# Patient Record
Sex: Female | Born: 1970
Health system: Southern US, Community
[De-identification: ages and names within clinical notes are randomized; demographics above are authoritative.]

## PROBLEM LIST (undated history)

## (undated) DIAGNOSIS — K219 Gastro-esophageal reflux disease without esophagitis: Secondary | ICD-10-CM

## (undated) DIAGNOSIS — I10 Essential (primary) hypertension: Secondary | ICD-10-CM

## (undated) DIAGNOSIS — E039 Hypothyroidism, unspecified: Secondary | ICD-10-CM

## (undated) DIAGNOSIS — K635 Polyp of colon: Secondary | ICD-10-CM

## (undated) DIAGNOSIS — G8929 Other chronic pain: Secondary | ICD-10-CM

## (undated) DIAGNOSIS — E785 Hyperlipidemia, unspecified: Secondary | ICD-10-CM

## (undated) DIAGNOSIS — F419 Anxiety disorder, unspecified: Secondary | ICD-10-CM

## (undated) DIAGNOSIS — R519 Headache, unspecified: Secondary | ICD-10-CM

## (undated) DIAGNOSIS — T7840XA Allergy, unspecified, initial encounter: Secondary | ICD-10-CM

## (undated) DIAGNOSIS — R002 Palpitations: Secondary | ICD-10-CM

## (undated) DIAGNOSIS — Z87442 Personal history of urinary calculi: Secondary | ICD-10-CM

## (undated) DIAGNOSIS — R51 Headache: Secondary | ICD-10-CM

## (undated) DIAGNOSIS — G4730 Sleep apnea, unspecified: Secondary | ICD-10-CM

## (undated) HISTORY — DX: Polyp of colon: K63.5

## (undated) HISTORY — DX: Gastro-esophageal reflux disease without esophagitis: K21.9

## (undated) HISTORY — DX: Other chronic pain: G89.29

## (undated) HISTORY — DX: Headache: R51

## (undated) HISTORY — PX: COLONOSCOPY: SHX174

## (undated) HISTORY — DX: Sleep apnea, unspecified: G47.30

## (undated) HISTORY — DX: Headache, unspecified: R51.9

## (undated) HISTORY — DX: Hyperlipidemia, unspecified: E78.5

## (undated) HISTORY — DX: Hypothyroidism, unspecified: E03.9

## (undated) HISTORY — PX: KNEE ARTHROSCOPY: SUR90

## (undated) HISTORY — DX: Personal history of urinary calculi: Z87.442

## (undated) HISTORY — DX: Palpitations: R00.2

## (undated) HISTORY — DX: Allergy, unspecified, initial encounter: T78.40XA

## (undated) HISTORY — DX: Anxiety disorder, unspecified: F41.9

---

## 2002-08-03 ENCOUNTER — Other Ambulatory Visit: Admission: RE | Admit: 2002-08-03 | Discharge: 2002-08-03 | Payer: Self-pay | Admitting: Family Medicine

## 2006-09-02 ENCOUNTER — Emergency Department (HOSPITAL_COMMUNITY): Admission: EM | Admit: 2006-09-02 | Discharge: 2006-09-02 | Payer: Self-pay | Admitting: Emergency Medicine

## 2006-09-24 ENCOUNTER — Encounter (INDEPENDENT_AMBULATORY_CARE_PROVIDER_SITE_OTHER): Payer: Self-pay | Admitting: Orthopedic Surgery

## 2006-09-24 ENCOUNTER — Ambulatory Visit: Payer: Self-pay | Admitting: *Deleted

## 2006-09-24 ENCOUNTER — Ambulatory Visit (HOSPITAL_COMMUNITY): Admission: RE | Admit: 2006-09-24 | Discharge: 2006-09-24 | Payer: Self-pay | Admitting: Orthopedic Surgery

## 2007-09-25 ENCOUNTER — Emergency Department (HOSPITAL_COMMUNITY): Admission: EM | Admit: 2007-09-25 | Discharge: 2007-09-25 | Payer: Self-pay | Admitting: Emergency Medicine

## 2007-10-01 ENCOUNTER — Ambulatory Visit: Payer: Self-pay | Admitting: Cardiology

## 2007-10-01 LAB — CONVERTED CEMR LAB
Free T4: 0.8 ng/dL (ref 0.6–1.6)
TSH: 3.08 microintl units/mL (ref 0.35–5.50)

## 2007-11-03 ENCOUNTER — Ambulatory Visit: Payer: Self-pay | Admitting: Cardiology

## 2008-07-22 ENCOUNTER — Ambulatory Visit: Payer: Self-pay | Admitting: Cardiology

## 2008-07-22 DIAGNOSIS — R002 Palpitations: Secondary | ICD-10-CM | POA: Insufficient documentation

## 2008-11-18 ENCOUNTER — Ambulatory Visit (HOSPITAL_COMMUNITY): Admission: RE | Admit: 2008-11-18 | Discharge: 2008-11-18 | Payer: Self-pay | Admitting: Obstetrics and Gynecology

## 2010-07-25 NOTE — Assessment & Plan Note (Signed)
[facility] HEALTHCARE                            CARDIOLOGY OFFICE NOTE   [name] [name], [name] [name]                          MRN:          [id number]  DATE:11/03/2007                            DOB:          [birth year]    REFERRING PHYSICIAN:  Ernestina Penna, M.D.   REFERRING PHYSICIAN:  Ernestina Penna, MD, [facility]  Medicine.   HISTORY OF PRESENT ILLNESS:  This is a 40 year old with a past history  of hyperthyroidism who presents for reevaluation after an episode of  supraventricular tachycardia.  This episode happened a little over a  month ago, and she felt chest tightness and palpitations and  lightheadedness; this was at work.  EMS was summoned and her heart rate  was 280 when EMS had arrived.  No strips were available.  She did a  Valsalva maneuver and her symptoms resolved before she arrived in the  emergency room.  We did see her in clinic on October 01, 2007, set her up  with 23-month of event monitor, a number of strips were transmitted, all  of which showed either sinus rhythm or sinus tachycardia.  She states  that she has had no further significant symptoms.  She actually says she  feels fine.  The stress in her life has gone down somewhat, and she has  actually begun regularly exercising.  She has cut out caffeine entirely.  We did check her thyroid function, and she does have normal TSH and free  T4 while on her levothyroxine.  She has not been using any illicit  drugs.  She does not use over-the-counter cold medicines.   SOCIAL HISTORY:  The patient did quit smoking in July 2009.  [name]  separated from her husband, lives with her child.   PAST MEDICAL HISTORY:  1. Significant for hypothyroidism.  2. Episode of SVT in July 2009, with no EKG strips available for      evaluation, though it sounds like it is probably AVNRT.   PHYSICAL EXAMINATION:  VITAL SIGNS:  Blood pressure today is 113/79,  pulse is 80 and regular, and weight is  167 pounds.  GENERAL:  The patient is in no apparent stress.  [name] a well-developed  female.  NECK:  No JVD.  No thyromegaly or nodule.  LUNGS:  Clear to auscultation bilaterally with normal respiratory  effort.  HEART:  Regular S1 and S2.  No S3.  No S4.  No murmur.  No peripheral  edema.  ABDOMEN:  Soft, nontender.  No hepatosplenomegaly.  Normal bowel sounds.  EXTREMITIES:  There is no clubbing or cyanosis.   ASSESSMENT AND PLAN:  This is a 40 year old with history of an episode  of supraventricular tachycardia, though unfortunately, there are no  strips available for review.  The patient did wear an event monitor for  a month.  All of the transmitted events showed sinus rhythm only.  She  has had no recurrence of the significant symptoms that she had  initially.  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  It did appear to terminate with the  Valsalva maneuver.  Since that time, the patient has cut all the  caffeine out of her diet, and she also says that she has cut back on all  of the stress in her life.  I think given the fact that these episodes  seemed to be few and far between given the lack of anything over the  last month.  It would be reasonable to continue to manage these episodes  expectantly.  I told to her at length about vagal maneuvers to terminate  an episode of atrioventricular nodal reentrant tachycardia, such as,  Valsalva, cough and if needed immersion of the face in ice water.  The  patient will continue to avoid caffeine.  If she does have recurrent  debilitating episodes and that we are able to document and if they are  indeed atrioventricular nodal reentrant tachycardia, then radiofrequency  ablation would be an option.  Of note, there was no evidence of a delta  wave on EKG to suggest Wolff-Parkinson-White.     Marca Ancona, MD  Electronically Signed    DM/MedQ  DD: 11/03/2007  DT: 11/03/2007  Job #:  [id number]   cc:   Ernestina Penna, M.D.  Madolyn Frieze Jens Som, MD, [facility]

## 2010-07-25 NOTE — Assessment & Plan Note (Signed)
[facility] HEALTHCARE                            CARDIOLOGY OFFICE NOTE   NAME:[name] [name]                          MRN:          [id number]  DATE:10/01/2007                            DOB:          [birth year]    PRIMARY CARE PHYSICIAN:  Ernestina Penna, MD   CHIEF COMPLAINT:  Palpitations.   HISTORY OF PRESENT ILLNESS:  This is a 40 year old female with past  history of hypothyroidism who presents for evaluation to cardiology  clinic after an episode of SVT.  The patient does report approximately 4  episodes over the last 3 years in which she has felt lightheaded with  palpitations and a racing heart.  These episodes are self-limited.  She  did experience another episode of the same sensations 1 week ago.  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  She  did not pass out.  She sat down and called a coworker who summoned EMS.  Reportedly, her heart rate was 208 when EMS arrived.  She was told to do  a Valsalva maneuver, which resolved her symptoms by the time she arrived  in the emergency department at [facility].  She was in normal sinus  rhythm again.  There was no strip available of the SVT.  As noted, her  prior episodes of palpitations were not serious per her.  She did have  an event monitor placed and has since then noted 3 mild episodes of  palpitations and minor lightheadedness.  She states that these were very  short episodes and each time, she did activate her event monitor.  She  did do a Valsalva maneuver with a couple of these episodes and feels  that that did help.  She has been under a lot of stress lately.  She  recently separated from her husband, has been very busy at work, and has  also been  helping care for both of her parents and for her 1 child.  She  does not use any illicit drugs and she has been taking her thyroid  medication as ordered.   LABORATORY DATA:  Of note, her most recent thyroid labs from June were  within normal limits.  EKG today shows normal sinus rhythm with  nonspecific anterolateral T-wave flattening and the QT is not prolonged.  There is a normal axis.  There is no delta wave.  Most recent labs from  June 2009, showed a TSH of 4.3, which is normal and a T4 of 7, which is  normal.  LDL was 79, HDL 48, and triglycerides 82.  LFTs were normal.  Potassium was 4.0 and hematocrit was 43.2.   SOCIAL HISTORY:  The patient lives with her child.  [name] separated  from her husband.  She does help provide some care to her parents.  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  The patient smoked for  about 6 months since she quit smoking on September 25, 2007.  She drinks 1-2  glasses of wine per week.  Has not overindulged in alcohol recently.  She denies any illicit drugs such as cocaine or amphetamines.   FAMILY HISTORY:  The patient' grandparents both had coronary artery  disease.  Her grandmother died in her 43s from a heart attack and her  grandfather had a heart attack at 62.   PAST MEDICAL HISTORY:  Significant for hypothyroidism.  The patient is  on Levoxyl.   MEDICATIONS:  Depo-Provera and Levoxyl 112 mcg daily.   REVIEW OF SYSTEMS:  The review of systems is negative except as noted in  the history of present illness.   PHYSICAL EXAMINATION:  VITAL SIGNS:  Blood pressure 90/65, heart rate  80, and weight 167 pounds.  GENERAL:  The patient is in no apparent distress.  NECK:  There is no carotid bruit.  There is no JVD.  LUNGS:  Clear to auscultation bilaterally.  HEART:  Regular, S1 and S2.  There is no S3, no S4, and no murmur.  ABDOMEN:  Soft and nontender.  No hepatosplenomegaly.  EXTREMITIES:  There is no edema.  There are 2+ dorsalis pedis  pulses  bilaterally.   ASSESSMENT AND PLAN:  This is a 40 year old with a history of episodes  of palpitations and lightheadedness that were associated with what  appeared to be a supraventricular tachycardia according to EMS report  last week.  1. Supraventricular tachycardia.  The patient did have an episode of      documented heart rate at 208.  She did not have an episode of      syncope associated with this.  She was able to terminate the      episode with a Valsalva maneuver.  Her EKG shows no delta wave.      There is no prolonged QT interval.  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  [name]      wearing an event monitor, which she will have on until October 29, 2007.  We did give over vagal maneuver such as Valsalva and if that      does not work, use of ice water immersion to terminate an episode      of SVT.  If this is indeed AVNRT, then AVNRT ablation is an option      for this patient, especially if she is having frequent and highly      symptomatic episodes.  For now, we will plan on awaiting the      results of her event monitor.  I will see her back in the office      the week after her event monitor is completed.  If she has any      further severe episodes, she is instructed to call here to contact      me, and I have also given her a prescription for Toprol-XL 25 mg      daily, which she may also start if she does begin to have frequent      symptoms.  2. Hypothyroidism.  The patient is on Levoxyl for hypothyroidism.  Her      thyroid indices were within normal limits in the beginning of June.      We will go ahead  and repeat her TSH and free T4, although she does      deny any changes in her medication regimen during that period.     Marca Ancona, MD  Electronically Signed    DM/MedQ  DD: 10/01/2007  DT: 10/02/2007  Job #: [id number]   cc:   Ernestina Penna, M.D.  Doylene Canning. Ladona Ridgel, MD

## 2010-12-08 LAB — DIFFERENTIAL
Basophils Absolute: 0
Eosinophils Relative: 1
Lymphocytes Relative: 25
Lymphs Abs: 2.7
Monocytes Absolute: 0.8
Neutro Abs: 7

## 2010-12-08 LAB — APTT: aPTT: 26

## 2010-12-08 LAB — POCT CARDIAC MARKERS
Myoglobin, poc: 44.2
Myoglobin, poc: 47.1
Myoglobin, poc: 48.2
Operator id: 272551
Operator id: 272551
Operator id: 272551
Troponin i, poc: <0.05
Troponin i, poc: <0.05

## 2010-12-08 LAB — POCT I-STAT, CHEM 8
BUN: 13
Calcium, Ion: 1.13
Creatinine, Ser: 0.9
Hemoglobin: 14.6
Sodium: 143
TCO2: 22

## 2010-12-08 LAB — CBC
HCT: 41.9
Hemoglobin: 14.6
RBC: 4.63
RDW: 12.7
WBC: 10.6 — ABNORMAL HIGH

## 2011-06-20 ENCOUNTER — Encounter: Payer: Self-pay | Admitting: Internal Medicine

## 2011-07-09 ENCOUNTER — Encounter: Payer: Self-pay | Admitting: Internal Medicine

## 2011-07-09 ENCOUNTER — Ambulatory Visit (INDEPENDENT_AMBULATORY_CARE_PROVIDER_SITE_OTHER): Payer: 59 | Admitting: Internal Medicine

## 2011-07-09 VITALS — BP 128/82 | HR 94 | Ht 66.0 in | Wt 191.6 lb

## 2011-07-09 DIAGNOSIS — K6289 Other specified diseases of anus and rectum: Secondary | ICD-10-CM

## 2011-07-09 DIAGNOSIS — Z860100 Personal history of colon polyps, unspecified: Secondary | ICD-10-CM

## 2011-07-09 DIAGNOSIS — K625 Hemorrhage of anus and rectum: Secondary | ICD-10-CM

## 2011-07-09 DIAGNOSIS — K648 Other hemorrhoids: Secondary | ICD-10-CM

## 2011-07-09 MED ORDER — METOCLOPRAMIDE HCL 10 MG PO TABS: ORAL_TABLET | ORAL | Status: DC

## 2011-07-09 MED ORDER — PEG-KCL-NACL-NASULF-NA ASC-C 100 G PO SOLR: 1.0000 | Freq: Once | ORAL | Status: DC

## 2011-07-09 NOTE — Progress Notes (Signed)
HISTORY OF PRESENT ILLNESS:  [name] [name] is a 41 y.o. female with the below listed medical history who presents today regarding rectal pain, rectal bleeding, fecal leakage, and possible hemorrhoids. Patient reports having had GI care by Dr. Karilyn Cota, in [place], previously. She reports previous colonoscopy with polypectomy. That record has been requested. She reports today that she has intermittent problems with straining. She has treated with stool softeners, water, and yogurt. Symptoms are worse with heavy lifting. Problem has been going on for some time. Also problems with urinary leakage. Bleeding and rectal pain. Hydrocortisone suppositories have helped. She also states she has some problems with bloating, belching, reflux, and states she has been diagnosed with irritable bowel syndrome. Review of outside records shows normal CBC from 05/22/2011 with a hemoglobin of 15.0. Hemoccult study was positive.Marland Kitchen  REVIEW OF SYSTEMS:  All non-GI ROS negative except for sinus allergy told, anxiety, back pain, visual change, depression, fatigue, headaches, itching, muscle pains and cramps, night sweats, shortness of breath, sleeping problem, excessive thirst, excessive urination  Past Medical History  Diagnosis Date  . Hypothyroidism   . Anxiety   . Palpitations   . Chronic headaches   . Colon polyps   . Hyperlipidemia     Past Surgical History  Procedure Date  . Cesarean section     Social History [name] [name]  reports that she has been smoking Cigarettes.  She has been smoking about .5 packs per day. She has never used smokeless tobacco. She reports that she drinks alcohol. She reports that she does not use illicit drugs.  family history includes Breast cancer in her paternal grandmother; Heart disease in her maternal grandfather and maternal grandmother; Hypertension in her mother; and Lung cancer in her father.  Not on File     PHYSICAL EXAMINATION: Vital signs: BP 128/82  Pulse 94  Ht  5\' 6"  (1.676 m)  Wt 191 lb 9.6 oz (86.909 kg)  BMI 30.92 kg/m2  SpO2 98%  LMP 01/28/2011  Constitutional: generally well-appearing, no acute distress Psychiatric: alert and oriented x3, cooperative Eyes: extraocular movements intact, anicteric, conjunctiva pink Mouth: oral pharynx moist, no lesions Neck: supple no lymphadenopathy Cardiovascular: heart regular rate and rhythm, no murmur Lungs: clear to auscultation bilaterally Abdomen: soft, nontender, nondistended, no obvious ascites, no peritoneal signs, normal bowel sounds, no organomegaly Rectal:deferred until colonoscopy Extremities: no lower extremity edema bilaterally Skin: no lesions on visible extremities Neuro: No focal deficits.   ASSESSMENT:  #1. Intermittent problems with rectal pain and bleeding. Rule out seizure. Rule out hemorrhoids. #2. Hemoccult-positive stool #3. History of colon polyp. (Prior records pending). #4. Incontinence of feces #5. General medical problems   PLAN:  #1. Continue hydrocortisone suppositories #2. Obtain outside records for review #3. Colonoscopy to evaluate rectal bleeding, pain, and possibly provide polyp surveillance.The nature of the procedure, as well as the risks, benefits, and alternatives were carefully and thoroughly reviewed with the patient. Ample time for discussion and questions allowed. The patient understood, was satisfied, and agreed to proceed. Movi prep prescribed. The patient instructed on its use. #4.consider daily MiraLax #5. Ongoing general care with Dr. Christell Constant

## 2011-07-09 NOTE — Patient Instructions (Signed)
You have been scheduled for a colonoscopy with propofol. Please follow written instructions given to you at your visit today.  Please pick up your prep kit at the pharmacy within the next 1-3 days.  We have sent the following medications to your pharmacy for you to pick up at your convenience:  Reglan.  Please take 1 tablet before drinking each batch of moviprep to help with nausea

## 2011-07-18 ENCOUNTER — Telehealth: Payer: Self-pay

## 2011-07-18 NOTE — Telephone Encounter (Signed)
Left message with Dr. Patty Sermons office (PCP) with a 3rd attempt to get medical records on patient

## 2011-07-23 ENCOUNTER — Encounter (INDEPENDENT_AMBULATORY_CARE_PROVIDER_SITE_OTHER): Payer: Self-pay

## 2011-07-25 ENCOUNTER — Ambulatory Visit (AMBULATORY_SURGERY_CENTER): Payer: 59 | Admitting: Internal Medicine

## 2011-07-25 ENCOUNTER — Encounter: Payer: Self-pay | Admitting: Internal Medicine

## 2011-07-25 VITALS — BP 100/73 | HR 78 | Temp 97.7°F | Resp 20 | Ht 66.0 in | Wt 191.0 lb

## 2011-07-25 DIAGNOSIS — Z860100 Personal history of colon polyps, unspecified: Secondary | ICD-10-CM

## 2011-07-25 DIAGNOSIS — K648 Other hemorrhoids: Secondary | ICD-10-CM

## 2011-07-25 DIAGNOSIS — K625 Hemorrhage of anus and rectum: Secondary | ICD-10-CM

## 2011-07-25 DIAGNOSIS — R195 Other fecal abnormalities: Secondary | ICD-10-CM

## 2011-07-25 DIAGNOSIS — D126 Benign neoplasm of colon, unspecified: Secondary | ICD-10-CM

## 2011-07-25 MED ORDER — SODIUM CHLORIDE 0.9 % IV SOLN: 500.0000 mL | INTRAVENOUS | Status: DC

## 2011-07-25 NOTE — Progress Notes (Signed)
Patient did not experience any of the following events: a burn prior to discharge; a fall within the facility; wrong site/side/patient/procedure/implant event; or a hospital transfer or hospital admission upon discharge from the facility. (G8907) Patient did not have preoperative order for IV antibiotic SSI prophylaxis. (G8918)  

## 2011-07-25 NOTE — Patient Instructions (Signed)

## 2011-07-25 NOTE — Op Note (Signed)
[facility] [address] [place], [place]  [address]  COLONOSCOPY PROCEDURE REPORT  PATIENT:  [name] [name], [name] [name]  MR#:  [id number] BIRTHDATE:  06/20/1970, 40 yrs. old  GENDER:  female ENDOSCOPIST:  Wilhemina Bonito. Eda Keys, MD REF. BY:  Wardell Heath, N.P. PROCEDURE DATE:  07/25/2011 PROCEDURE:  Colonoscopy with snare polypectomy x 1 ASA CLASS:  Class II INDICATIONS:  heme positive stool, rectal bleeding, history of polyps ; index 09-2009 (Dr Karilyn Cota) HPP only MEDICATIONS:   MAC sedation, administered by CRNA, propofol (Diprivan) 300 mg IV  DESCRIPTION OF PROCEDURE:   After the risks benefits and alternatives of the procedure were thoroughly explained, informed consent was obtained.  Digital rectal exam was performed and revealed no abnormalities.   The LB CF-[id number] endoscope was introduced through the anus and advanced to the cecum, which was identified by both the appendix and ileocecal valve, without limitations.  The quality of the prep was excellent, using MoviPrep.  The instrument was then slowly withdrawn as the colon was fully examined. <<PROCEDUREIMAGES>>  FINDINGS:  A diminutive polyp was found in the ascending colon and snared without cautery. Retrieval was successful.   Otherwise normal colonoscopy without other polyps, masses, vascular ectasias, or inflammatory changes.   Retroflexed views in the rectum revealed no abnormalities.    The time to cecum = 2:50 minutes. The scope was then withdrawn in  12:53  minutes from the cecum and the procedure completed.  COMPLICATIONS:  None  ENDOSCOPIC IMPRESSION: 1) Diminutive polyp in the ascending colon - removed 2) Otherwise normal colonoscopy  RECOMMENDATIONS: 1) Repeat colonoscopy in 5 years if polyp adenomatous; otherwise age 10 years  ______________________________ Wilhemina Bonito. Eda Keys, MD  CC:  Bennie Pierini, NP;  The Patient  n. eSIGNED:   Justice Aguirre N. Eda Keys at 07/25/2011 08:45 AM  Rana Snare,  [phone]

## 2011-07-26 ENCOUNTER — Telehealth: Payer: Self-pay | Admitting: *Deleted

## 2011-07-26 ENCOUNTER — Telehealth: Payer: Self-pay | Admitting: Internal Medicine

## 2011-07-26 NOTE — Telephone Encounter (Signed)
No answer left message to call office for questions or concerns. 

## 2011-07-26 NOTE — Telephone Encounter (Signed)
Patient states that her glands feels swollen and she has sores on her tongue and it is sore. Wonders if she could have caught thrush from colonoscopy. Spoke with Jonny Ruiz, CRNA about patients concerns. Explained that nothing we did yesterday would cause this and that she may need to see her PCP if symptoms persist. Patient thanked [place] for our help.

## 2011-07-30 NOTE — Telephone Encounter (Signed)
See other note

## 2011-07-31 ENCOUNTER — Encounter: Payer: Self-pay | Admitting: Internal Medicine

## 2011-12-05 ENCOUNTER — Emergency Department (HOSPITAL_COMMUNITY)
Admission: EM | Admit: 2011-12-05 | Discharge: 2011-12-05 | Disposition: A | Discharge status: Home / Self Care | Payer: 59 | Attending: Emergency Medicine | Admitting: Emergency Medicine

## 2011-12-05 DIAGNOSIS — F411 Generalized anxiety disorder: Secondary | ICD-10-CM | POA: Insufficient documentation

## 2011-12-05 DIAGNOSIS — R002 Palpitations: Secondary | ICD-10-CM

## 2011-12-05 DIAGNOSIS — F17200 Nicotine dependence, unspecified, uncomplicated: Secondary | ICD-10-CM | POA: Insufficient documentation

## 2011-12-05 DIAGNOSIS — Z801 Family history of malignant neoplasm of trachea, bronchus and lung: Secondary | ICD-10-CM | POA: Insufficient documentation

## 2011-12-05 DIAGNOSIS — Z8249 Family history of ischemic heart disease and other diseases of the circulatory system: Secondary | ICD-10-CM | POA: Insufficient documentation

## 2011-12-05 DIAGNOSIS — Z803 Family history of malignant neoplasm of breast: Secondary | ICD-10-CM | POA: Insufficient documentation

## 2011-12-05 LAB — CBC WITH DIFFERENTIAL/PLATELET
Basophils Relative: 0 % (ref 0–1)
Eosinophils Absolute: 0.3 10*3/uL (ref 0.0–0.7)
Eosinophils Relative: 2 % (ref 0–5)
Lymphs Abs: 4.4 10*3/uL — ABNORMAL HIGH (ref 0.7–4.0)
MCH: 31.3 pg (ref 26.0–34.0)
MCHC: 35.1 g/dL (ref 30.0–36.0)
MCV: 89.4 fL (ref 78.0–100.0)
Platelets: 292 10*3/uL (ref 150–400)
RBC: 4.98 MIL/uL (ref 3.87–5.11)

## 2011-12-05 MED ORDER — ACETAMINOPHEN 325 MG PO TABS
975.0000 mg | ORAL_TABLET | Freq: Once | ORAL | Status: AC
  Administered 2011-12-05: 975 mg via ORAL
  Filled 2011-12-05: qty 3

## 2011-12-05 MED ORDER — LORAZEPAM 2 MG/ML IJ SOLN
1.0000 mg | Freq: Once | INTRAMUSCULAR | Status: AC
Start: 2011-12-05 — End: 2011-12-05
  Administered 2011-12-05: 1 mg via INTRAVENOUS
  Filled 2011-12-05: qty 1

## 2011-12-05 MED ORDER — SODIUM CHLORIDE 0.9 % IV BOLUS (SEPSIS)
1000.0000 mL | Freq: Once | INTRAVENOUS | Status: AC
  Administered 2011-12-05: 1000 mL via INTRAVENOUS

## 2011-12-05 MED ORDER — ONDANSETRON HCL 4 MG/2ML IJ SOLN
4.0000 mg | Freq: Once | INTRAMUSCULAR | Status: AC
  Administered 2011-12-05: 4 mg via INTRAVENOUS
  Filled 2011-12-05: qty 2

## 2011-12-05 NOTE — ED Notes (Addendum)
Per EMS- pt was at work when she began having palpations, weakness, and dizziness. Pt was found by EMS to have ST at 130 at this time pt is 80 NSR. 12 lead unremarkable with EMS.  Pt has had episodes SVT in the past where she was able to convert herself with vagal maneuvers . No meds given.

## 2011-12-05 NOTE — ED Notes (Signed)
CHEM 8 RESULTS  Na  142 K  3.9 Cl  107 iCa  1.18 TC02  22 Glu  92 BUN  10 Crea  0.8 Hct  44% Hb  15.0 AnGap  18

## 2011-12-05 NOTE — ED Provider Notes (Signed)
History     CSN: [id number]  Arrival date & time 12/05/11  1804   First MD Initiated Contact with Patient 12/05/11 1902      Chief Complaint  Patient presents with  . Palpitations  . Dizziness    (Consider location/radiation/quality/duration/timing/severity/associated sxs/prior treatment) HPI Comments: Patient is a 41 year old female with a past medical history of SVT and hypothyroidism who presents with sudden onset of dizziness, lightheadedness, and palpitations while working today. She reports the dizziness started when she stood up from her chair and became worse with walking. She denies any alleviating factors. The symptoms last for about 30 minutes and have subsided as she is currently asymptomatic besides headache and nausea. She denies chest pain, SOB, diaphoresis, abdominal pain, numbness/tingling.    Past Medical History  Diagnosis Date  . Hypothyroidism   . Anxiety   . Palpitations   . Chronic headaches   . Colon polyps   . Hyperlipidemia     Past Surgical History  Procedure Date  . Cesarean section     Family History  Problem Relation Age of Onset  . Heart disease Maternal Grandfather   . Heart disease Maternal Grandmother   . Lung cancer Father     died from  . Breast cancer Paternal Grandmother     paternal aunts x2  . Hypertension Mother     History  Substance Use Topics  . Smoking status: Current Every Day Smoker -- 0.5 packs/day    Types: Cigarettes  . Smokeless tobacco: Never Used  . Alcohol Use: Yes     rare    OB History    Grav Para Term Preterm Abortions TAB SAB Ect Mult Living                  Review of Systems  Cardiovascular: Positive for palpitations.  Gastrointestinal: Positive for nausea.  Neurological: Positive for dizziness and headaches.  All other systems reviewed and are negative.    Allergies  Review of patient's allergies indicates no known allergies.  Home Medications   Current Outpatient Rx  Name Route Sig  Dispense Refill  . CLONAZEPAM 0.5 MG PO TABS Oral Take 0.5 mg by mouth 2 (two) times daily as needed. For anxiety    . CYCLOBENZAPRINE HCL 10 MG PO TABS Oral Take 10 mg by mouth 3 (three) times daily as needed. For pain    . IBUPROFEN 800 MG PO TABS Oral Take 800 mg by mouth every 8 (eight) hours as needed. For pain    . LEVOTHYROXINE SODIUM 125 MCG PO TABS Oral Take 125 mcg by mouth daily.      BP 116/82  Pulse 97  Temp 97.9 F (36.6 C) (Oral)  Resp 18  SpO2 97%  Physical Exam  Nursing note and vitals reviewed. Constitutional: [name] oriented to person, place, and time. She appears well-developed and well-nourished. No distress.  HENT:  Head: Normocephalic and atraumatic.  Mouth/Throat: Oropharynx is clear and moist. No oropharyngeal exudate.  Eyes: Conjunctivae normal and EOM are normal. Pupils are equal, round, and reactive to light. No scleral icterus.  Neck: Normal range of motion. Neck supple.  Cardiovascular: Normal rate, regular rhythm and intact distal pulses.  Exam reveals no gallop and no friction rub.   No murmur heard.      No ankle edema noted.   Pulmonary/Chest: Effort normal. No respiratory distress. She has no wheezes. She has no rales. She exhibits no tenderness.  Abdominal: Soft. She exhibits no  distension. There is no tenderness. There is no rebound and no guarding.  Musculoskeletal: Normal range of motion.  Neurological: [name] alert and oriented to person, place, and time. Coordination normal.       Strength and sensation equal and intact bilaterally.   Skin: Skin is warm and dry. [name] not diaphoretic.  Psychiatric: She has a normal mood and affect. Her behavior is normal.    ED Course  Procedures (including critical care time)   Date: 12/05/2011  Rate: 87  Rhythm: normal sinus rhythm  QRS Axis: normal  Intervals: normal  ST/T Wave abnormalities: nonspecific ST changes  Conduction Disutrbances:none  Narrative Interpretation: normal sinus rhythm   Old EKG Reviewed: unchanged    Labs Reviewed  CBC WITH DIFFERENTIAL - Abnormal; Notable for the following:    WBC 11.0 (*)     Hemoglobin 15.6 (*)     Lymphs Abs 4.4 (*)     All other components within normal limits   No results found.   1. Palpitations       MDM  7:23 PM Labs, cardiac enzymes and EKG pending. Patient will have fluids, acetaminophen for headache, and zofran for nausea.   9:51 PM Troponin negative. Chem 8 unremarkable. Patient still feels palpitations currently.   11:22 PM Patient given ativan and additional fluids. Patient not likely having an acute cardiac event as she is still feeling heart racing and heart rate is below 100. Patient has a history of anxiety which is likely to contribute to symptoms. Patient also had a recent increase in synthroid which may be factored into palpitations. Patient reassured and advised to follow up with PCP for further evaluation.  No further evaluation indicated in the ED. Patient and Dr.Plunkett agreeable to plan.     Emilia Beck, PA-C 12/05/11 2324

## 2011-12-05 NOTE — ED Notes (Signed)
TROPONIN RESULTS  cTnl  0.00 ng/mL 

## 2011-12-05 NOTE — ED Notes (Signed)
PT. REPORTS BRIEF INTERMITTENT PALPITATIONS , PA NOTIFIED.

## 2011-12-06 LAB — POCT I-STAT, CHEM 8
BUN: 10 mg/dL (ref 6–23)
Creatinine, Ser: 0.8 mg/dL (ref 0.50–1.10)
Potassium: 3.9 mEq/L (ref 3.5–5.1)
Sodium: 142 mEq/L (ref 135–145)

## 2011-12-06 NOTE — ED Provider Notes (Signed)
Medical screening examination/treatment/procedure(s) were performed by non-physician practitioner and as supervising physician I was immediately available for consultation/collaboration.   Gwyneth Sprout, MD 12/06/11 [phone]

## 2012-06-13 ENCOUNTER — Other Ambulatory Visit: Payer: Self-pay | Admitting: *Deleted

## 2012-06-13 MED ORDER — CYCLOBENZAPRINE HCL 10 MG PO TABS: ORAL_TABLET | ORAL | Status: DC

## 2012-06-20 ENCOUNTER — Other Ambulatory Visit: Payer: Self-pay | Admitting: *Deleted

## 2012-06-20 MED ORDER — CLONAZEPAM 0.5 MG PO TABS: 0.5000 mg | ORAL_TABLET | Freq: Two times a day (BID) | ORAL | Status: DC

## 2012-06-20 NOTE — Telephone Encounter (Signed)
Last office visit 05-13-12. Rx last filled on 04-21-12 for #[phone]. Please advise. If approved please have nurse phone in to [facility] at [phone]. Thank you

## 2012-06-26 ENCOUNTER — Ambulatory Visit (INDEPENDENT_AMBULATORY_CARE_PROVIDER_SITE_OTHER): Payer: 59 | Admitting: Nurse Practitioner

## 2012-06-26 ENCOUNTER — Telehealth: Payer: Self-pay | Admitting: Nurse Practitioner

## 2012-06-26 VITALS — BP 130/90 | HR 123 | Temp 97.6°F | Ht 67.0 in | Wt 198.0 lb

## 2012-06-26 DIAGNOSIS — G43909 Migraine, unspecified, not intractable, without status migrainosus: Secondary | ICD-10-CM

## 2012-06-26 DIAGNOSIS — I498 Other specified cardiac arrhythmias: Secondary | ICD-10-CM

## 2012-06-26 DIAGNOSIS — I471 Supraventricular tachycardia: Secondary | ICD-10-CM

## 2012-06-26 MED ORDER — KETOROLAC TROMETHAMINE 60 MG/2ML IM SOLN
60.0000 mg | Freq: Once | INTRAMUSCULAR | Status: AC
  Administered 2012-06-26: 60 mg via INTRAMUSCULAR

## 2012-06-26 MED ORDER — SUMATRIPTAN SUCCINATE 50 MG PO TABS: ORAL_TABLET | ORAL | Status: DC

## 2012-06-26 NOTE — Progress Notes (Signed)
  Subjective:    Patient ID: [name] [name], female    DOB: 25-Nov-1970, 42 y.o.   MRN: [id number]  HPI- Patient in C/O migraines. Has woken up every morning this week with one. Motrin and [place] gel from chiropractor helps usually but not this week. Associated symptoms nausea and photophobia.    Review of Systems  Constitutional: Negative.   Eyes: Negative.   Respiratory: Negative.   Cardiovascular: Negative.   Neurological: Positive for headaches.  Hematological: Negative.   Psychiatric/Behavioral: Negative.        Objective:   Physical Exam  Constitutional: [name] oriented to person, place, and time. She appears well-developed and well-nourished.  HENT:  Head: Normocephalic.  Right Ear: External ear normal.  Left Ear: External ear normal.  Mouth/Throat: Oropharynx is clear and moist.  Eyes: EOM are normal.  Neck: Normal range of motion. Neck supple.  Cardiovascular: Normal rate, regular rhythm and normal heart sounds.   Pulmonary/Chest: Effort normal and breath sounds normal.  Lymphadenopathy:    She has no cervical adenopathy.  Neurological: [name] alert and oriented to person, place, and time. She has normal reflexes. No cranial nerve deficit.  Psychiatric: She has a normal mood and affect. Her behavior is normal. Judgment and thought content normal.   BP 130/90  Pulse 123  Temp(Src) 97.6 F (36.4 C) (Oral)  Ht 5\' 7"  (1.702 m)  Wt 198 lb (89.812 kg)  BMI 31 kg/m2        Assessment & Plan:  1. Migraines Cut back on caffeine Keep headache diary - ketorolac (TORADOL) injection 60 mg; Inject 2 mLs (60 mg total) into the muscle once. imitrex as ordered  Mary-Margaret Daphine Deutscher, FNP

## 2012-06-26 NOTE — Telephone Encounter (Signed)
Really NTBS for toradol shot and discuss migraine meds

## 2012-06-26 NOTE — Telephone Encounter (Signed)
Has had a migraine for a week and cant get rid of it. [name] taking ibuprofen otc and it isnt helping. Please advise

## 2012-06-26 NOTE — Telephone Encounter (Signed)
Appt made for pm clinic on 4-17 with MMM

## 2012-06-26 NOTE — Patient Instructions (Signed)
Migraine Headache A migraine headache is an intense, throbbing pain on one or both sides of your head. A migraine can last for 30 minutes to several hours. CAUSES  The exact cause of a migraine headache is not always known. However, a migraine may be caused when nerves in the brain become irritated and release chemicals that cause inflammation. This causes pain. SYMPTOMS  Pain on one or both sides of your head.  Pulsating or throbbing pain.  Severe pain that prevents daily activities.  Pain that is aggravated by any physical activity.  Nausea, vomiting, or both.  Dizziness.  Pain with exposure to bright lights, loud noises, or activity.  General sensitivity to bright lights, loud noises, or smells. Before you get a migraine, you may get warning signs that a migraine is coming (aura). An aura may include:  Seeing flashing lights.  Seeing bright spots, halos, or zig-zag lines.  Having tunnel vision or blurred vision.  Having feelings of numbness or tingling.  Having trouble talking.  Having muscle weakness. MIGRAINE TRIGGERS  Alcohol.  Smoking.  Stress.  Menstruation.  Aged cheeses.  Foods or drinks that contain nitrates, glutamate, aspartame, or tyramine.  Lack of sleep.  Chocolate.  Caffeine.  Hunger.  Physical exertion.  Fatigue.  Medicines used to treat chest pain (nitroglycerine), birth control pills, estrogen, and some blood pressure medicines. DIAGNOSIS  A migraine headache is often diagnosed based on:  Symptoms.  Physical examination.  A CT scan or MRI of your head. TREATMENT Medicines may be given for pain and nausea. Medicines can also be given to help prevent recurrent migraines.  HOME CARE INSTRUCTIONS  Only take over-the-counter or prescription medicines for pain or discomfort as directed by your caregiver. The use of long-term narcotics is not recommended.  Lie down in a dark, quiet room when you have a migraine.  Keep a journal  to find out what may trigger your migraine headaches. For example, write down:  What you eat and drink.  How much sleep you get.  Any change to your diet or medicines.  Limit alcohol consumption.  Quit smoking if you smoke.  Get 7 to 9 hours of sleep, or as recommended by your caregiver.  Limit stress.  Keep lights dim if bright lights bother you and make your migraines worse. SEEK IMMEDIATE MEDICAL CARE IF:   Your migraine becomes severe.  You have a fever.  You have a stiff neck.  You have vision loss.  You have muscular weakness or loss of muscle control.  You start losing your balance or have trouble walking.  You feel faint or pass out.  You have severe symptoms that are different from your first symptoms. MAKE SURE YOU:   Understand these instructions.  Will watch your condition.  Will get help right away if you are not doing well or get worse. Document Released: 02/26/2005 Document Revised: 05/21/2011 Document Reviewed: 02/16/2011 ExitCare Patient Information 2013 [employer].  

## 2012-07-03 ENCOUNTER — Ambulatory Visit (INDEPENDENT_AMBULATORY_CARE_PROVIDER_SITE_OTHER): Payer: 59 | Admitting: *Deleted

## 2012-07-03 DIAGNOSIS — IMO0001 Reserved for inherently not codable concepts without codable children: Secondary | ICD-10-CM

## 2012-07-03 DIAGNOSIS — Z309 Encounter for contraceptive management, unspecified: Secondary | ICD-10-CM

## 2012-07-03 MED ORDER — MEDROXYPROGESTERONE ACETATE 150 MG/ML IM SUSP
150.0000 mg | Freq: Once | INTRAMUSCULAR | Status: AC
Start: 2012-07-03 — End: 2012-07-03
  Administered 2012-07-03: 150 mg via INTRAMUSCULAR

## 2012-07-03 NOTE — Progress Notes (Signed)
Patient ID: [name] [name], female   DOB: 1971/03/10, 42 y.o.   MRN: [id number] Pt tolerated well

## 2012-07-10 ENCOUNTER — Other Ambulatory Visit: Payer: Self-pay | Admitting: *Deleted

## 2012-07-10 MED ORDER — IBUPROFEN 800 MG PO TABS: 800.0000 mg | ORAL_TABLET | Freq: Three times a day (TID) | ORAL | Status: DC | PRN

## 2012-07-15 ENCOUNTER — Telehealth: Payer: Self-pay | Admitting: Nurse Practitioner

## 2012-07-15 NOTE — Telephone Encounter (Signed)
APPT OFFERED FOR TOMORROW AM AT 800- PT CAN COME THURS- SO APPT MADE FOR THEN.

## 2012-07-17 ENCOUNTER — Encounter: Payer: Self-pay | Admitting: Nurse Practitioner

## 2012-07-17 ENCOUNTER — Ambulatory Visit (INDEPENDENT_AMBULATORY_CARE_PROVIDER_SITE_OTHER): Payer: 59 | Admitting: Nurse Practitioner

## 2012-07-17 VITALS — BP 111/73 | HR 111 | Temp 99.5°F | Ht 66.0 in | Wt 199.0 lb

## 2012-07-17 DIAGNOSIS — G43909 Migraine, unspecified, not intractable, without status migrainosus: Secondary | ICD-10-CM

## 2012-07-17 DIAGNOSIS — E039 Hypothyroidism, unspecified: Secondary | ICD-10-CM

## 2012-07-17 LAB — THYROID PANEL WITH TSH
Free Thyroxine Index: 3.9 (ref 1.0–3.9)
T3 Uptake: 36.2 % (ref 22.5–37.0)
TSH: 0.497 u[IU]/mL (ref 0.350–4.500)

## 2012-07-17 MED ORDER — PROPRANOLOL HCL 80 MG PO TABS: 80.0000 mg | ORAL_TABLET | Freq: Three times a day (TID) | ORAL | Status: DC

## 2012-07-17 NOTE — Patient Instructions (Signed)
Migraine Headache A migraine headache is an intense, throbbing pain on one or both sides of your head. A migraine can last for 30 minutes to several hours. CAUSES  The exact cause of a migraine headache is not always known. However, a migraine may be caused when nerves in the brain become irritated and release chemicals that cause inflammation. This causes pain. SYMPTOMS  Pain on one or both sides of your head.  Pulsating or throbbing pain.  Severe pain that prevents daily activities.  Pain that is aggravated by any physical activity.  Nausea, vomiting, or both.  Dizziness.  Pain with exposure to bright lights, loud noises, or activity.  General sensitivity to bright lights, loud noises, or smells. Before you get a migraine, you may get warning signs that a migraine is coming (aura). An aura may include:  Seeing flashing lights.  Seeing bright spots, halos, or zig-zag lines.  Having tunnel vision or blurred vision.  Having feelings of numbness or tingling.  Having trouble talking.  Having muscle weakness. MIGRAINE TRIGGERS  Alcohol.  Smoking.  Stress.  Menstruation.  Aged cheeses.  Foods or drinks that contain nitrates, glutamate, aspartame, or tyramine.  Lack of sleep.  Chocolate.  Caffeine.  Hunger.  Physical exertion.  Fatigue.  Medicines used to treat chest pain (nitroglycerine), birth control pills, estrogen, and some blood pressure medicines. DIAGNOSIS  A migraine headache is often diagnosed based on:  Symptoms.  Physical examination.  A CT scan or MRI of your head. TREATMENT Medicines may be given for pain and nausea. Medicines can also be given to help prevent recurrent migraines.  HOME CARE INSTRUCTIONS  Only take over-the-counter or prescription medicines for pain or discomfort as directed by your caregiver. The use of long-term narcotics is not recommended.  Lie down in a dark, quiet room when you have a migraine.  Keep a journal  to find out what may trigger your migraine headaches. For example, write down:  What you eat and drink.  How much sleep you get.  Any change to your diet or medicines.  Limit alcohol consumption.  Quit smoking if you smoke.  Get 7 to 9 hours of sleep, or as recommended by your caregiver.  Limit stress.  Keep lights dim if bright lights bother you and make your migraines worse. SEEK IMMEDIATE MEDICAL CARE IF:   Your migraine becomes severe.  You have a fever.  You have a stiff neck.  You have vision loss.  You have muscular weakness or loss of muscle control.  You start losing your balance or have trouble walking.  You feel faint or pass out.  You have severe symptoms that are different from your first symptoms. MAKE SURE YOU:   Understand these instructions.  Will watch your condition.  Will get help right away if you are not doing well or get worse. Document Released: 02/26/2005 Document Revised: 05/21/2011 Document Reviewed: 02/16/2011 ExitCare Patient Information 2013 [employer].  

## 2012-07-17 NOTE — Progress Notes (Signed)
  Subjective:    Patient ID: [name] [name], female    DOB: Sep 04, 1970, 42 y.o.   MRN: [id number]  HPI  Patient has a history of migraines. Had one this past Tuesday that lasted all day. Takes imitrex and that helps. Seems that the migraines are coming more frequently. When she has a headache she gets nauseated and breaks out in a sweat. Currently not having a headache.  Hypothyroidism Patient need med refill and hasn't had blood work in [employer].  Review of Systems  All other systems reviewed and are negative.       Objective:   Physical Exam  Constitutional: [name] oriented to person, place, and time. She appears well-developed and well-nourished.  Cardiovascular: Normal rate, normal heart sounds and intact distal pulses.   Pulmonary/Chest: Effort normal and breath sounds normal.  Neurological: [name] alert and oriented to person, place, and time. She has normal reflexes. No cranial nerve deficit.    BP 111/73  Pulse 111  Temp(Src) 99.5 F (37.5 C) (Oral)  Ht 5\' 6"  (1.676 m)  Wt 199 lb (90.266 kg)  BMI 32.13 kg/m2       Assessment & Plan:  1. Migraines *Rest in cool room Cool compresses to forehead imitrex as needed - propranolol (INDERAL) 80 MG tablet; Take 1 tablet (80 mg total) by mouth 3 (three) times daily.  Dispense: 30 tablet; Refill: 5  2. Hypothyroidism Labs pending- Will call in meds once we get labs back  Mary-Margaret Daphine Deutscher, FNP

## 2012-07-18 ENCOUNTER — Other Ambulatory Visit: Payer: Self-pay | Admitting: Nurse Practitioner

## 2012-07-18 MED ORDER — LEVOTHYROXINE SODIUM 112 MCG PO TABS: 112.0000 ug | ORAL_TABLET | Freq: Every day | ORAL | Status: DC

## 2012-09-08 ENCOUNTER — Other Ambulatory Visit: Payer: Self-pay | Admitting: Family Medicine

## 2012-10-06 ENCOUNTER — Ambulatory Visit: Payer: 59

## 2012-10-13 ENCOUNTER — Ambulatory Visit: Payer: 59

## 2012-10-14 ENCOUNTER — Other Ambulatory Visit: Payer: Self-pay | Admitting: Nurse Practitioner

## 2012-10-15 ENCOUNTER — Other Ambulatory Visit: Payer: Self-pay | Admitting: *Deleted

## 2012-10-15 MED ORDER — CLONAZEPAM 0.5 MG PO TABS: 0.5000 mg | ORAL_TABLET | Freq: Two times a day (BID) | ORAL | Status: DC

## 2012-10-15 NOTE — Telephone Encounter (Signed)
Patient last seen in office on 07-17-12. Rx last filled on 08-26-12 for #60. Please advise. If approved please route to Pool B so nurse can call in to [facility] at [phone]

## 2012-10-15 NOTE — Telephone Encounter (Signed)
Patient last seen in office on 07-17-12. Please advise

## 2012-10-15 NOTE — Telephone Encounter (Signed)
Please call in refill of clonazepam with 1 refill

## 2012-10-15 NOTE — Telephone Encounter (Signed)
Called into Pharmacy and left refill authorization on their voicemail.

## 2012-10-17 ENCOUNTER — Encounter: Payer: Self-pay | Admitting: Nurse Practitioner

## 2012-10-17 ENCOUNTER — Ambulatory Visit (INDEPENDENT_AMBULATORY_CARE_PROVIDER_SITE_OTHER): Payer: 59 | Admitting: Nurse Practitioner

## 2012-10-17 ENCOUNTER — Ambulatory Visit: Payer: 59

## 2012-10-17 VITALS — BP 120/79 | HR 91 | Temp 99.1°F | Ht 66.0 in | Wt 208.0 lb

## 2012-10-17 DIAGNOSIS — Z3042 Encounter for surveillance of injectable contraceptive: Secondary | ICD-10-CM

## 2012-10-17 DIAGNOSIS — G43909 Migraine, unspecified, not intractable, without status migrainosus: Secondary | ICD-10-CM

## 2012-10-17 DIAGNOSIS — Z3049 Encounter for surveillance of other contraceptives: Secondary | ICD-10-CM

## 2012-10-17 DIAGNOSIS — N390 Urinary tract infection, site not specified: Secondary | ICD-10-CM

## 2012-10-17 LAB — POCT UA - MICROSCOPIC ONLY: Yeast, UA: NEGATIVE

## 2012-10-17 LAB — POCT URINALYSIS DIPSTICK
Nitrite, UA: NEGATIVE
Protein, UA: NEGATIVE
Urobilinogen, UA: NEGATIVE

## 2012-10-17 MED ORDER — NITROFURANTOIN MONOHYD MACRO 100 MG PO CAPS: 100.0000 mg | ORAL_CAPSULE | Freq: Two times a day (BID) | ORAL | Status: DC

## 2012-10-17 MED ORDER — PROPRANOLOL HCL 80 MG PO TABS: 80.0000 mg | ORAL_TABLET | Freq: Three times a day (TID) | ORAL | Status: DC

## 2012-10-17 MED ORDER — CYCLOBENZAPRINE HCL 10 MG PO TABS: 10.0000 mg | ORAL_TABLET | Freq: Three times a day (TID) | ORAL | Status: DC | PRN

## 2012-10-17 MED ORDER — LEVOTHYROXINE SODIUM 112 MCG PO TABS: 112.0000 ug | ORAL_TABLET | Freq: Every day | ORAL | Status: DC

## 2012-10-17 MED ORDER — SUMATRIPTAN SUCCINATE 50 MG PO TABS: ORAL_TABLET | ORAL | Status: DC

## 2012-10-17 MED ORDER — IBUPROFEN 800 MG PO TABS: 800.0000 mg | ORAL_TABLET | Freq: Four times a day (QID) | ORAL | Status: DC | PRN

## 2012-10-17 MED ORDER — CLONAZEPAM 0.5 MG PO TABS: 0.5000 mg | ORAL_TABLET | Freq: Two times a day (BID) | ORAL | Status: DC

## 2012-10-17 NOTE — Progress Notes (Signed)
Subjective:    Patient ID: [name] [name], female    DOB: 10/15/1970, 42 y.o.   MRN: [id number]  Urinary Tract Infection  This is a new problem. The current episode started in the past 7 days. The problem occurs every urination. The problem has been unchanged. The quality of the pain is described as burning. The pain is at a severity of 5/10. The patient is experiencing no pain. There has been no fever. [name] not sexually active. There is no history of pyelonephritis. Associated symptoms include flank pain, frequency, hesitancy and urgency. Pertinent negatives include no possible pregnancy. The treatment provided no relief.      Review of Systems  Genitourinary: Positive for hesitancy, urgency, frequency and flank pain.  All other systems reviewed and are negative.       Objective:   Physical Exam  Constitutional: She appears well-developed and well-nourished.  Cardiovascular: Normal rate and normal heart sounds.   Pulmonary/Chest: Effort normal and breath sounds normal.  Abdominal: Soft. Bowel sounds are normal. There is tenderness (low suprpubic pain on palpation).  Genitourinary:  No CVA tenderness    BP 120/79  Pulse 91  Temp(Src) 99.1 F (37.3 C) (Oral)  Ht 5\' 6"  (1.676 m)  Wt 208 lb (94.348 kg)  BMI 33.59 kg/m2 Results for orders placed in visit on 10/17/12  POCT URINALYSIS DIPSTICK      Result Value Range   Color, UA yellow     Clarity, UA clear     Glucose, UA neg     Bilirubin, UA neg     Ketones, UA neg     Spec Grav, UA 1.010     Blood, UA neg     pH, UA 6.0     Protein, UA neg     Urobilinogen, UA negative     Nitrite, UA neg     Leukocytes, UA Trace    POCT UA - MICROSCOPIC ONLY      Result Value Range   WBC, Ur, HPF, POC 1-5     RBC, urine, microscopic neg     Bacteria, U Microscopic neg     Mucus, UA neg     Epithelial cells, urine per micros occ     Crystals, Ur, HPF, POC neg     Casts, Ur, LPF, POC neg     Yeast, UA neg            Assessment & Plan:   1. UTI (urinary tract infection)   2. Migraines   3. Depo-Provera contraceptive status    Orders Placed This Encounter  Procedures  . hCG, quantitative, pregnancy  . POCT urinalysis dipstick  . POCT UA - Microscopic Only   Meds ordered this encounter  Medications  . SUMAtriptan (IMITREX) 50 MG tablet    Sig: 1 at headache onset may repeat in 2 hours X1    Dispense:  10 tablet    Refill:  1    Order Specific Question:  Supervising Provider    Answer:  Ernestina Penna [[id number]]  . propranolol (INDERAL) 80 MG tablet    Sig: Take 1 tablet (80 mg total) by mouth 3 (three) times daily.    Dispense:  30 tablet    Refill:  5    Order Specific Question:  Supervising Provider    Answer:  Ernestina Penna [[id number]]  . levothyroxine (SYNTHROID, LEVOTHROID) 112 MCG tablet    Sig: Take 1 tablet (112 mcg total) by mouth daily before breakfast.  Dispense:  30 tablet    Refill:  5    Order Specific Question:  Supervising Provider    Answer:  Ernestina Penna [[id number]]  . ibuprofen (ADVIL,MOTRIN) 800 MG tablet    Sig: Take 1 tablet (800 mg total) by mouth every 6 (six) hours as needed for pain.    Dispense:  90 tablet    Refill:  0    Generic [id number]    800MG   10/14/2012 4:27:35 PM  . cyclobenzaprine (FLEXERIL) 10 MG tablet    Sig: Take 1 tablet (10 mg total) by mouth 3 (three) times daily as needed for muscle spasms.    Dispense:  30 tablet    Refill:  1    Generic [id number]   10MG   . clonazePAM (KLONOPIN) 0.5 MG tablet    Sig: Take 1 tablet (0.5 mg total) by mouth 2 (two) times daily. For anxiety    Dispense:  60 tablet    Refill:  1  . nitrofurantoin, macrocrystal-monohydrate, (MACROBID) 100 MG capsule    Sig: Take 1 capsule (100 mg total) by mouth 2 (two) times daily.    Dispense:  14 capsule    Refill:  0    Order Specific Question:  Supervising Provider    Answer:  Deborra Medina  all meds refilled today Force fluids  Rest rto Prn an din 3 months  for routine follow up  Mary-Margaret Daphine Deutscher, FNP

## 2012-10-17 NOTE — Patient Instructions (Signed)
Urinary Tract Infection  Urinary tract infections (UTIs) can develop anywhere along your urinary tract. Your urinary tract is your body's drainage system for removing wastes and extra water. Your urinary tract includes two kidneys, two ureters, a bladder, and a urethra. Your kidneys are a pair of bean-shaped organs. Each kidney is about the size of your fist. They are located below your ribs, one on each side of your spine.  CAUSES  Infections are caused by microbes, which are microscopic organisms, including fungi, viruses, and bacteria. These organisms are so small that they can only be seen through a microscope. Bacteria are the microbes that most commonly cause UTIs.  SYMPTOMS   Symptoms of UTIs may vary by age and gender of the patient and by the location of the infection. Symptoms in young women typically include a frequent and intense urge to urinate and a painful, burning feeling in the bladder or urethra during urination. Older women and men are more likely to be tired, shaky, and weak and have muscle aches and abdominal pain. A fever may mean the infection is in your kidneys. Other symptoms of a kidney infection include pain in your back or sides below the ribs, nausea, and vomiting.  DIAGNOSIS  To diagnose a UTI, your caregiver will ask you about your symptoms. Your caregiver also will ask to provide a urine sample. The urine sample will be tested for bacteria and white blood cells. White blood cells are made by your body to help fight infection.  TREATMENT   Typically, UTIs can be treated with medication. Because most UTIs are caused by a bacterial infection, they usually can be treated with the use of antibiotics. The choice of antibiotic and length of treatment depend on your symptoms and the type of bacteria causing your infection.  HOME CARE INSTRUCTIONS   If you were prescribed antibiotics, take them exactly as your caregiver instructs you. Finish the medication even if you feel better after you  have only taken some of the medication.   Drink enough water and fluids to keep your urine clear or pale yellow.   Avoid caffeine, tea, and carbonated beverages. They tend to irritate your bladder.   Empty your bladder often. Avoid holding urine for long periods of time.   Empty your bladder before and after sexual intercourse.   After a bowel movement, women should cleanse from front to back. Use each tissue only once.  SEEK MEDICAL CARE IF:    You have back pain.   You develop a fever.   Your symptoms do not begin to resolve within 3 days.  SEEK IMMEDIATE MEDICAL CARE IF:    You have severe back pain or lower abdominal pain.   You develop chills.   You have nausea or vomiting.   You have continued burning or discomfort with urination.  MAKE SURE YOU:    Understand these instructions.   Will watch your condition.   Will get help right away if you are not doing well or get worse.  Document Released: 12/06/2004 Document Revised: 08/28/2011 Document Reviewed: 04/06/2011  ExitCare Patient Information 2014 [employer].

## 2012-10-24 ENCOUNTER — Telehealth: Payer: Self-pay | Admitting: Nurse Practitioner

## 2012-10-24 NOTE — Telephone Encounter (Signed)
Please go over her lab work

## 2012-10-24 NOTE — Telephone Encounter (Signed)
Can't find result check with lab please

## 2012-10-28 ENCOUNTER — Ambulatory Visit (INDEPENDENT_AMBULATORY_CARE_PROVIDER_SITE_OTHER): Payer: 59 | Admitting: Family Medicine

## 2012-10-28 DIAGNOSIS — Z309 Encounter for contraceptive management, unspecified: Secondary | ICD-10-CM

## 2012-10-28 DIAGNOSIS — IMO0001 Reserved for inherently not codable concepts without codable children: Secondary | ICD-10-CM

## 2012-10-28 MED ORDER — MEDROXYPROGESTERONE ACETATE 150 MG/ML IM SUSP
150.0000 mg | Freq: Once | INTRAMUSCULAR | Status: AC
  Administered 2012-10-28: 150 mg via INTRAMUSCULAR

## 2012-10-28 NOTE — Patient Instructions (Signed)

## 2013-01-06 ENCOUNTER — Telehealth: Payer: Self-pay | Admitting: Nurse Practitioner

## 2013-01-06 NOTE — Telephone Encounter (Signed)
appt made

## 2013-01-07 ENCOUNTER — Encounter: Payer: Self-pay | Admitting: Nurse Practitioner

## 2013-01-07 ENCOUNTER — Encounter (INDEPENDENT_AMBULATORY_CARE_PROVIDER_SITE_OTHER): Payer: Self-pay

## 2013-01-07 ENCOUNTER — Ambulatory Visit (INDEPENDENT_AMBULATORY_CARE_PROVIDER_SITE_OTHER): Payer: 59 | Admitting: Nurse Practitioner

## 2013-01-07 VITALS — BP 116/82 | HR 96 | Temp 98.0°F | Ht 66.0 in | Wt 213.0 lb

## 2013-01-07 DIAGNOSIS — M65839 Other synovitis and tenosynovitis, unspecified forearm: Secondary | ICD-10-CM

## 2013-01-07 DIAGNOSIS — M778 Other enthesopathies, not elsewhere classified: Secondary | ICD-10-CM

## 2013-01-07 MED ORDER — PREDNISONE (PAK) 10 MG PO TABS: 10.0000 mg | ORAL_TABLET | Freq: Every day | ORAL | Status: DC

## 2013-01-07 NOTE — Progress Notes (Signed)
  Subjective:    Patient ID: [name] [name], female    DOB: [birth year], 42 y.o.   MRN: [id number]  HPI patient in c/o right wrist pain that started about 2 months ago- mainly in her thumb to start with- now the entire wrist hurts- tingling at times- pain radiates up to elbow.    Review of Systems  All other systems reviewed and are negative.       Objective:   Physical Exam  Constitutional: She appears well-developed and well-nourished.  Cardiovascular: Normal rate and normal heart sounds.   Pulmonary/Chest: Effort normal and breath sounds normal.  Musculoskeletal:  FROM of right thumb with pain on opposition of fingers. Negative phalen Negative tinel     BP 116/82  Pulse 96  Temp(Src) 98 F (36.7 C) (Oral)  Ht 5\' 6"  (1.676 m)  Wt 213 lb (96.616 kg)  BMI 34.4 kg/m2      Assessment & Plan:   1. Thumb tendonitis    Meds ordered this encounter  Medications  . predniSONE (STERAPRED UNI-PAK) 10 MG tablet    Sig: Take 1 tablet (10 mg total) by mouth daily. As directed X6 days    Dispense:  21 tablet    Refill:  0    Order Specific Question:  Supervising Provider    Answer:  Ernestina Penna [[id number]]   Thumb spica split Soak in epsom salt daily RTO prn  Mary-Margaret Daphine Deutscher, FNP

## 2013-01-07 NOTE — Patient Instructions (Signed)
Tendinitis  Tendinitis is swelling and inflammation of the tendons. Tendons are band-like tissues that connect muscle to bone. Tendinitis commonly occurs in the:    Shoulders (rotator cuff).   Heels (Achilles tendon).   Elbows (triceps tendon).  CAUSES  Tendinitis is usually caused by overusing the tendon, muscles, and joints involved. When the tissue surrounding a tendon (synovium) becomes inflamed, it is called tenosynovitis. Tendinitis commonly develops in people whose jobs require repetitive motions.  SYMPTOMS   Pain.   Tenderness.   Mild swelling.  DIAGNOSIS  Tendinitis is usually diagnosed by physical exam. Your caregiver may also order X-rays or other imaging tests.  TREATMENT  Your caregiver may recommend certain medicines or exercises for your treatment.  HOME CARE INSTRUCTIONS    Use a sling or splint for as long as directed by your caregiver until the pain decreases.   Put ice on the injured area.   Put ice in a plastic bag.   Place a towel between your skin and the bag.   Leave the ice on for 15-20 minutes, 3-4 times a day.   Avoid using the limb while the tendon is painful. Perform gentle range of motion exercises only as directed by your caregiver. Stop exercises if pain or discomfort increase, unless directed otherwise by your caregiver.   Only take over-the-counter or prescription medicines for pain, discomfort, or fever as directed by your caregiver.  SEEK MEDICAL CARE IF:    Your pain and swelling increase.   You develop new, unexplained symptoms, especially increased numbness in the hands.  MAKE SURE YOU:    Understand these instructions.   Will watch your condition.   Will get help right away if you are not doing well or get worse.  Document Released: 02/24/2000 Document Revised: 05/21/2011 Document Reviewed: 05/15/2010  ExitCare Patient Information 2014 [employer].

## 2013-01-11 ENCOUNTER — Other Ambulatory Visit: Payer: Self-pay | Admitting: Nurse Practitioner

## 2013-01-13 NOTE — Telephone Encounter (Signed)
Last seen 01/07/13 MMM  IF approved route to nurse to call in to [facility]  [phone]

## 2013-01-13 NOTE — Telephone Encounter (Signed)
Please call in klonopin

## 2013-01-14 NOTE — Telephone Encounter (Signed)
Pt med called to pharm 

## 2013-01-16 DIAGNOSIS — Z0289 Encounter for other administrative examinations: Secondary | ICD-10-CM

## 2013-01-28 ENCOUNTER — Ambulatory Visit: Payer: 59

## 2013-01-30 ENCOUNTER — Ambulatory Visit: Payer: 59

## 2013-03-18 ENCOUNTER — Ambulatory Visit (INDEPENDENT_AMBULATORY_CARE_PROVIDER_SITE_OTHER): Payer: 59 | Admitting: Family Medicine

## 2013-03-18 ENCOUNTER — Encounter: Payer: Self-pay | Admitting: Family Medicine

## 2013-03-18 ENCOUNTER — Telehealth: Payer: Self-pay | Admitting: Nurse Practitioner

## 2013-03-18 VITALS — BP 124/72 | HR 113 | Temp 99.6°F | Ht 66.0 in | Wt 221.0 lb

## 2013-03-18 DIAGNOSIS — R059 Cough, unspecified: Secondary | ICD-10-CM

## 2013-03-18 DIAGNOSIS — R05 Cough: Secondary | ICD-10-CM

## 2013-03-18 LAB — POCT INFLUENZA A/B
Influenza A, POC: NEGATIVE
Influenza B, POC: NEGATIVE

## 2013-03-18 MED ORDER — AZITHROMYCIN 250 MG PO TABS: ORAL_TABLET | ORAL | Status: DC

## 2013-03-18 MED ORDER — OSELTAMIVIR PHOSPHATE 75 MG PO CAPS: 75.0000 mg | ORAL_CAPSULE | Freq: Two times a day (BID) | ORAL | Status: DC

## 2013-03-18 NOTE — Telephone Encounter (Signed)
Appt given for today 

## 2013-03-18 NOTE — Patient Instructions (Signed)

## 2013-03-18 NOTE — Progress Notes (Signed)
   Subjective:    Patient ID: Germaine Pomfret, female    DOB: Jan 13, 1971, 43 y.o.   MRN: [id number]  HPI This 44 y.o. female presents for evaluation of uri sx's, aches, and fever for a day.   Review of Systems No chest pain, SOB, HA, dizziness, vision change, N/V, diarrhea, constipation, dysuria, urinary urgency or frequency, myalgias, arthralgias or rash.     Objective:   Physical Exam Vital signs noted  Well developed well nourished female.  HEENT - Head atraumatic Normocephalic                Eyes - PERRLA, Conjuctiva - clear Sclera- Clear EOMI                Ears - EAC's Wnl TM's Wnl Gross Hearing WNL                Nose - Nares patent                 Throat - oropharanx wnl Respiratory - Lungs CTA bilateral Cardiac - RRR S1 and S2 without murmur GI - Abdomen soft Nontender and bowel sounds active x 4        Assessment & Plan:  Cough - Plan: POCT Influenza A/B.  Zpak as directed, Tamiflu 75mg  one po bid x 5 days Push po fluids, rest, tylenol and motrin otc prn as directed for fever, arthralgias, and myalgias.  Follow up prn if sx's continue or persist.  Lysbeth Penner FNP

## 2013-04-03 DIAGNOSIS — Z029 Encounter for administrative examinations, unspecified: Secondary | ICD-10-CM

## 2013-04-21 ENCOUNTER — Encounter: Payer: Self-pay | Admitting: Family Medicine

## 2013-04-21 ENCOUNTER — Ambulatory Visit (INDEPENDENT_AMBULATORY_CARE_PROVIDER_SITE_OTHER): Payer: 59 | Admitting: Family Medicine

## 2013-04-21 VITALS — BP 111/75 | HR 91 | Temp 97.3°F | Ht 66.0 in | Wt 223.0 lb

## 2013-04-21 DIAGNOSIS — J209 Acute bronchitis, unspecified: Secondary | ICD-10-CM

## 2013-04-21 MED ORDER — AZITHROMYCIN 250 MG PO TABS: ORAL_TABLET | ORAL | Status: DC

## 2013-04-21 MED ORDER — METHYLPREDNISOLONE (PAK) 4 MG PO TABS: ORAL_TABLET | ORAL | Status: DC

## 2013-04-21 NOTE — Patient Instructions (Signed)

## 2013-04-21 NOTE — Progress Notes (Signed)
   Subjective:    Patient ID: [name] [name], female    DOB: [birth year], 43 y.o.   MRN: [id number]  HPI This 43 y.o. female presents for evaluation of uri sx's and productive cough for a week.   Review of Systems C/o uri sx's No chest pain, SOB, HA, dizziness, vision change, N/V, diarrhea, constipation, dysuria, urinary urgency or frequency, myalgias, arthralgias or rash.     Objective:   Physical Exam Vital signs noted  Well developed well nourished female.  HEENT - Head atraumatic Normocephalic                Eyes - PERRLA, Conjuctiva - clear Sclera- Clear EOMI                Ears - EAC's Wnl TM's Wnl Gross Hearing WN                Throat - oropharanx wnl Respiratory - Lungs CTA bilateral Cardiac - RRR S1 and S2 without murmur GI - Abdomen soft Nontender and bowel sounds active x 4     Assessment & Plan:  Acute bronchitis - Plan: azithromycin (ZITHROMAX) 250 MG tablet, methylPREDNIsolone (MEDROL DOSPACK) 4 MG tablet Push po fluids, rest, tylenol and motrin otc prn as directed for fever, arthralgias, and myalgias.  Follow up prn if sx's continue or persist.  Lysbeth Penner FNP

## 2013-04-30 ENCOUNTER — Other Ambulatory Visit: Payer: Self-pay | Admitting: Nurse Practitioner

## 2013-05-08 DIAGNOSIS — Z0289 Encounter for other administrative examinations: Secondary | ICD-10-CM

## 2013-05-15 ENCOUNTER — Other Ambulatory Visit: Payer: Self-pay | Admitting: Nurse Practitioner

## 2013-05-21 ENCOUNTER — Other Ambulatory Visit: Payer: Self-pay | Admitting: Nurse Practitioner

## 2013-06-05 ENCOUNTER — Other Ambulatory Visit: Payer: Self-pay | Admitting: Nurse Practitioner

## 2013-06-08 ENCOUNTER — Other Ambulatory Visit: Payer: Self-pay | Admitting: Family Medicine

## 2013-06-08 NOTE — Telephone Encounter (Signed)
Please review

## 2013-06-08 NOTE — Telephone Encounter (Signed)
Please call in clonaezpam with 1 refills

## 2013-06-08 NOTE — Telephone Encounter (Signed)
Patient last seen in office on 04-21-13. Rx last filled on 03-31-13 with 1 rf. Please advise. If approved please route to Pool A so nurse can phone in to pharmacy

## 2013-06-09 NOTE — Telephone Encounter (Signed)
Called into [facility]

## 2013-07-01 ENCOUNTER — Telehealth: Payer: Self-pay | Admitting: Nurse Practitioner

## 2013-07-01 NOTE — Telephone Encounter (Signed)
Patient aware.

## 2013-07-01 NOTE — Telephone Encounter (Signed)
No NTBS before starting

## 2013-07-30 ENCOUNTER — Other Ambulatory Visit (HOSPITAL_COMMUNITY): Payer: Self-pay | Admitting: Specialist

## 2013-07-30 DIAGNOSIS — Z1231 Encounter for screening mammogram for malignant neoplasm of breast: Secondary | ICD-10-CM

## 2013-08-04 ENCOUNTER — Ambulatory Visit (HOSPITAL_COMMUNITY)
Admission: RE | Admit: 2013-08-04 | Discharge: 2013-08-04 | Disposition: A | Discharge status: Home / Self Care | Payer: 59 | Source: Ambulatory Visit | Attending: Specialist | Admitting: Specialist

## 2013-08-04 DIAGNOSIS — Z1231 Encounter for screening mammogram for malignant neoplasm of breast: Secondary | ICD-10-CM | POA: Insufficient documentation

## 2013-08-04 DIAGNOSIS — Z803 Family history of malignant neoplasm of breast: Secondary | ICD-10-CM | POA: Insufficient documentation

## 2013-08-05 ENCOUNTER — Other Ambulatory Visit: Payer: Self-pay | Admitting: Nurse Practitioner

## 2013-09-01 ENCOUNTER — Other Ambulatory Visit: Payer: 59 | Admitting: Nurse Practitioner

## 2013-09-21 ENCOUNTER — Other Ambulatory Visit: Payer: Self-pay | Admitting: Nurse Practitioner

## 2013-10-12 ENCOUNTER — Other Ambulatory Visit: Payer: Self-pay | Admitting: *Deleted

## 2013-10-12 MED ORDER — CLONAZEPAM 0.5 MG PO TABS: ORAL_TABLET | ORAL | Status: DC

## 2013-10-12 NOTE — Telephone Encounter (Signed)
Please call in klonopin with 1 refills 

## 2013-10-12 NOTE — Telephone Encounter (Signed)
Last filled 08/05/13 call into [facility]

## 2013-10-13 NOTE — Telephone Encounter (Signed)
Called in.

## 2013-10-22 ENCOUNTER — Other Ambulatory Visit: Payer: Self-pay | Admitting: Nurse Practitioner

## 2013-10-22 ENCOUNTER — Other Ambulatory Visit: Payer: Self-pay | Admitting: Family Medicine

## 2013-10-22 ENCOUNTER — Ambulatory Visit: Payer: 59 | Admitting: Nurse Practitioner

## 2013-11-11 ENCOUNTER — Telehealth: Payer: Self-pay | Admitting: Nurse Practitioner

## 2013-11-12 ENCOUNTER — Ambulatory Visit (INDEPENDENT_AMBULATORY_CARE_PROVIDER_SITE_OTHER): Payer: 59 | Admitting: Nurse Practitioner

## 2013-11-12 ENCOUNTER — Encounter: Payer: Self-pay | Admitting: Nurse Practitioner

## 2013-11-12 VITALS — BP 121/83 | HR 95 | Temp 99.9°F | Ht 66.0 in | Wt 225.0 lb

## 2013-11-12 DIAGNOSIS — R1310 Dysphagia, unspecified: Secondary | ICD-10-CM

## 2013-11-12 DIAGNOSIS — J302 Other seasonal allergic rhinitis: Secondary | ICD-10-CM

## 2013-11-12 DIAGNOSIS — R350 Frequency of micturition: Secondary | ICD-10-CM

## 2013-11-12 DIAGNOSIS — R5383 Other fatigue: Secondary | ICD-10-CM

## 2013-11-12 DIAGNOSIS — R5381 Other malaise: Secondary | ICD-10-CM

## 2013-11-12 DIAGNOSIS — J3089 Other allergic rhinitis: Secondary | ICD-10-CM

## 2013-11-12 LAB — POCT UA - MICROSCOPIC ONLY
Bacteria, U Microscopic: NEGATIVE
Casts, Ur, LPF, POC: NEGATIVE
Crystals, Ur, HPF, POC: NEGATIVE
RBC, urine, microscopic: NEGATIVE
YEAST UA: NEGATIVE

## 2013-11-12 LAB — POCT URINALYSIS DIPSTICK
Bilirubin, UA: NEGATIVE
Blood, UA: NEGATIVE
GLUCOSE UA: NEGATIVE
Ketones, UA: NEGATIVE
Nitrite, UA: NEGATIVE
PROTEIN UA: NEGATIVE
SPEC GRAV UA: 1.015
UROBILINOGEN UA: NEGATIVE
pH, UA: 6

## 2013-11-12 MED ORDER — PROPRANOLOL HCL 80 MG PO TABS: ORAL_TABLET | ORAL | Status: DC

## 2013-11-12 MED ORDER — CYCLOBENZAPRINE HCL 10 MG PO TABS: ORAL_TABLET | ORAL | Status: DC

## 2013-11-12 MED ORDER — CLONAZEPAM 0.5 MG PO TABS: ORAL_TABLET | ORAL | Status: DC

## 2013-11-12 NOTE — Patient Instructions (Signed)
Sleep Apnea  Sleep apnea is a sleep disorder characterized by abnormal pauses in breathing while you sleep. When your breathing pauses, the level of oxygen in your blood decreases. This causes you to move out of deep sleep and into light sleep. As a result, your quality of sleep is poor, and the system that carries your blood throughout your body (cardiovascular system) experiences stress. If sleep apnea remains untreated, the following conditions can develop:  High blood pressure (hypertension).  Coronary artery disease.  Inability to achieve or maintain an erection (impotence).  Impairment of your thought process (cognitive dysfunction). There are three types of sleep apnea: 1. Obstructive sleep apnea--Pauses in breathing during sleep because of a blocked airway. 2. Central sleep apnea--Pauses in breathing during sleep because the area of the brain that controls your breathing does not send the correct signals to the muscles that control breathing. 3. Mixed sleep apnea--A combination of both obstructive and central sleep apnea. RISK FACTORS The following risk factors can increase your risk of developing sleep apnea:  Being overweight.  Smoking.  Having narrow passages in your nose and throat.  Being of older age.  Being female.  Alcohol use.  Sedative and tranquilizer use.  Ethnicity. Among individuals younger than 35 years, African Americans are at increased risk of sleep apnea. SYMPTOMS   Difficulty staying asleep.  Daytime sleepiness and fatigue.  Loss of energy.  Irritability.  Loud, heavy snoring.  Morning headaches.  Trouble concentrating.  Forgetfulness.  Decreased interest in sex. DIAGNOSIS  In order to diagnose sleep apnea, your caregiver will perform a physical examination. Your caregiver may suggest that you take a home sleep test. Your caregiver may also recommend that you spend the night in a sleep lab. In the sleep lab, several monitors record  information about your heart, lungs, and brain while you sleep. Your leg and arm movements and blood oxygen level are also recorded. TREATMENT The following actions may help to resolve mild sleep apnea:  Sleeping on your side.   Using a decongestant if you have nasal congestion.   Avoiding the use of depressants, including alcohol, sedatives, and narcotics.   Losing weight and modifying your diet if you are overweight. There also are devices and treatments to help open your airway:  Oral appliances. These are custom-made mouthpieces that shift your lower jaw forward and slightly open your bite. This opens your airway.  Devices that create positive airway pressure. This positive pressure "splints" your airway open to help you breathe better during sleep. The following devices create positive airway pressure:  Continuous positive airway pressure (CPAP) device. The CPAP device creates a continuous level of air pressure with an air pump. The air is delivered to your airway through a mask while you sleep. This continuous pressure keeps your airway open.  Nasal expiratory positive airway pressure (EPAP) device. The EPAP device creates positive air pressure as you exhale. The device consists of single-use valves, which are inserted into each nostril and held in place by adhesive. The valves create very little resistance when you inhale but create much more resistance when you exhale. That increased resistance creates the positive airway pressure. This positive pressure while you exhale keeps your airway open, making it easier to breath when you inhale again.  Bilevel positive airway pressure (BPAP) device. The BPAP device is used mainly in patients with central sleep apnea. This device is similar to the CPAP device because it also uses an air pump to deliver continuous air pressure   through a mask. However, with the BPAP machine, the pressure is set at two different levels. The pressure when you  exhale is lower than the pressure when you inhale.  Surgery. Typically, surgery is only done if you cannot comply with less invasive treatments or if the less invasive treatments do not improve your condition. Surgery involves removing excess tissue in your airway to create a wider passage way. Document Released: 02/16/2002 Document Revised: 06/23/2012 Document Reviewed: 07/05/2011 Exit[employer] Patient Information 2015 [employer]. This information is not intended to replace advice given to you by your health care provider. Make sure you discuss any questions you have with your health care provider.  

## 2013-11-12 NOTE — Telephone Encounter (Signed)
appt scheduled by Shirlean Mylar

## 2013-11-12 NOTE — Progress Notes (Signed)
   Subjective:    Patient ID: [name] [name], female    DOB: 06-21-1970, 43 y.o.   MRN: [id number]  HPI Patient states she feels weak all over and not eating or sleeping well.  Having headaches, sore throat, left ear congestion with popping. Having urinary frequency for several days.  Had a UTI in July and wants to make sure she doesn't have a reoccurence  Taking Allegra D for allergy symptoms.  Gets choked and feels like food gets stuck for the last year.    Review of Systems  Constitutional: Positive for appetite change and fatigue.  HENT: Positive for ear pain, sinus pressure, sneezing and sore throat.   Respiratory: Negative for cough.   Cardiovascular: Negative.   Musculoskeletal: Negative.   Skin: Negative.   Neurological: Negative.   Psychiatric/Behavioral: Negative.        Objective:   Physical Exam  Constitutional: [name] oriented to person, place, and time. She appears well-developed and well-nourished.  HENT:  Right Ear: External ear normal.  Left Ear: External ear normal.  Nose: Nose normal.  Mouth/Throat: Oropharynx is clear and moist.  Neck: Normal range of motion. Neck supple.  Cardiovascular: Normal rate, regular rhythm and normal heart sounds.   Pulmonary/Chest: Effort normal and breath sounds normal.  Neurological: [name] alert and oriented to person, place, and time.  Skin: Skin is warm and dry.  Psychiatric: She has a normal mood and affect. Her behavior is normal. Judgment and thought content normal.   BP 121/83  Pulse 95  Temp(Src) 99.9 F (37.7 C) (Oral)  Ht 5\' 6"  (1.676 m)  Wt 225 lb (102.059 kg)  BMI 36.33 kg/m2        Assessment & Plan:  1. Frequency of urination Urine clear - POCT urinalysis dipstick - POCT UA - Microscopic Only  2. Dysphagia, unspecified(787.20) Avoid foods that are difficult to chew - Ambulatory referral to Gastroenterology  3. Other malaise and fatigue Rest Will wait on sleep study results - Ambulatory referral to  Sleep Studies  4. Other seasonal allergic rhinitis Allegra d OTC Force fluids RTO prn  Mary-Margaret Hassell Done, FNP

## 2013-11-12 NOTE — Addendum Note (Signed)
Addended by: Chevis Pretty on: 11/12/2013 04:53 PM   Modules accepted: Orders

## 2013-11-12 NOTE — Telephone Encounter (Signed)
appt made for pt 11/12/13 in pm with MMM.

## 2013-11-25 ENCOUNTER — Ambulatory Visit: Payer: 59 | Admitting: Nurse Practitioner

## 2013-12-18 ENCOUNTER — Encounter: Payer: Self-pay | Admitting: Nurse Practitioner

## 2013-12-26 ENCOUNTER — Other Ambulatory Visit: Payer: Self-pay | Admitting: Nurse Practitioner

## 2013-12-28 NOTE — Telephone Encounter (Signed)
Last seen9/3/15 MMM Last thyroid level 07/17/12

## 2014-01-11 ENCOUNTER — Institutional Professional Consult (permissible substitution): Payer: 59 | Admitting: Internal Medicine

## 2014-01-14 ENCOUNTER — Telehealth: Payer: Self-pay | Admitting: Nurse Practitioner

## 2014-01-14 NOTE — Telephone Encounter (Signed)
appt made

## 2014-02-02 ENCOUNTER — Other Ambulatory Visit: Payer: Self-pay | Admitting: Nurse Practitioner

## 2014-02-02 NOTE — Telephone Encounter (Signed)
Please advise on refills- last TSH 07/17/13.  Pt has follow up appt 02/24/14.

## 2014-02-02 NOTE — Telephone Encounter (Signed)
no more refills without being seen  

## 2014-02-24 ENCOUNTER — Ambulatory Visit: Payer: 59 | Admitting: Nurse Practitioner

## 2014-02-25 ENCOUNTER — Other Ambulatory Visit: Payer: Self-pay | Admitting: Nurse Practitioner

## 2014-02-25 NOTE — Telephone Encounter (Signed)
Please advise on refills.  Last seen 11/12/13, has follow up with MMM 04/09/14.

## 2014-02-25 NOTE — Telephone Encounter (Signed)
Please call in klonopin with 0 refills 

## 2014-02-26 NOTE — Telephone Encounter (Signed)
Phoned in.

## 2014-03-08 ENCOUNTER — Other Ambulatory Visit: Payer: Self-pay | Admitting: Nurse Practitioner

## 2014-03-27 ENCOUNTER — Ambulatory Visit (INDEPENDENT_AMBULATORY_CARE_PROVIDER_SITE_OTHER): Payer: 59 | Admitting: Nurse Practitioner

## 2014-03-27 VITALS — BP 130/85 | HR 88 | Temp 98.2°F | Ht 66.0 in | Wt 229.8 lb

## 2014-03-27 DIAGNOSIS — R002 Palpitations: Secondary | ICD-10-CM

## 2014-03-27 DIAGNOSIS — I471 Supraventricular tachycardia, unspecified: Secondary | ICD-10-CM

## 2014-03-27 DIAGNOSIS — F410 Panic disorder [episodic paroxysmal anxiety] without agoraphobia: Secondary | ICD-10-CM

## 2014-03-27 DIAGNOSIS — E039 Hypothyroidism, unspecified: Secondary | ICD-10-CM | POA: Insufficient documentation

## 2014-03-27 NOTE — Progress Notes (Signed)
   Subjective:    Patient ID: [name] [name], female    DOB: 10/01/1970, 44 y.o.   MRN: [id number]  HPI Patient in today saying that she had an episode yesterday that heart was racing weak and shaky- Started about 2 pm yesterday- She has a history of tachycardia. She said that she a klonopin and propranolol. Took over an hour to work and she gradually started feeling better. No heart racing since then- She says it feels like heart if fluttering. SHe does  Not avoid caffeine ( coke and coffee ) . Had some trouble sleeping last night but all in all feels better. * history of panic attacks in which she has the klonopin for. * histiry of hypothyroidism and has not had blood work done in a while.    Review of Systems  Constitutional: Negative.   HENT: Negative.   Respiratory: Negative.   Cardiovascular: Positive for palpitations.  Gastrointestinal: Negative.   Genitourinary: Negative.   Neurological: Negative.   Psychiatric/Behavioral: Negative.   All other systems reviewed and are negative.      Objective:   Physical Exam  Constitutional: [name] oriented to person, place, and time. She appears well-developed and well-nourished. She appears distressed.  Cardiovascular: Normal rate, regular rhythm and normal heart sounds.   Pulmonary/Chest: Effort normal and breath sounds normal.  Neurological: [name] alert and oriented to person, place, and time.  Skin: Skin is warm and dry.  Psychiatric: She has a normal mood and affect. Her behavior is normal. Judgment and thought content normal.   BP 130/85 mmHg  Pulse 88  Temp(Src) 98.2 F (36.8 C) (Oral)  Ht _0  (1.676 m)  Wt 229 lb 12.8 oz (104.237 kg)  BMI 37.11 kg/m2        Assessment & Plan:   1. SVT (supraventricular tachycardia)   2. Palpitations   3. Hypothyroidism, unspecified hypothyroidism type   4. Panic attacks     Orders Placed This Encounter  Procedures  . CMP14+EGFR  . Thyroid Panel With TSH   Avoid all  Caffeine Breathing technique to slowing heart rate down discussed Keep diary of episodes- and how long it lasts RTO prn  Mary-Margaret Hassell Done, FNP

## 2014-03-27 NOTE — Patient Instructions (Signed)
Supraventricular Tachycardia °Supraventricular tachycardia (SVT) is an abnormal heart rhythm (arrhythmia) that causes the heart to beat very fast (tachycardia). This kind of fast heartbeat originates in the upper chambers of the heart (atria). SVT can cause the heart to beat greater than 100 beats per minute. SVT can have a rapid burst of heartbeats. This can start and stop suddenly without warning and is called nonsustained. SVT can also be sustained, in which the heart beats at a continuous fast rate.  °CAUSES  °There can be different causes of SVT. Some of these include: °· Heart valve problems such as mitral valve prolapse. °· An enlarged heart (hypertrophic cardiomyopathy). °· Congenital heart problems. °· Heart inflammation (pericarditis). °· Hyperthyroidism. °· Low potassium or magnesium levels. °· Caffeine. °· Drug use such as cocaine, methamphetamines, or stimulants. °· Some over-the-counter medicines such as: °¨ Decongestants. °¨ Diet medicines. °¨ Herbal medicines. °SYMPTOMS  °Symptoms of SVT can vary. Symptoms depend on whether the SVT is sustained or nonsustained. You may experience: °· No symptoms (asymptomatic). °· An awareness of your heart beating rapidly (palpitations). °· Shortness of breath. °· Chest pain or pressure. °If your blood pressure drops because of the SVT, you may experience: °· Fainting or near fainting. °· Weakness. °· Dizziness. °DIAGNOSIS  °Different tests can be performed to diagnose SVT, such as: °· An electrocardiogram (EKG). This is a painless test that records the electrical activity of your heart. °· Holter monitor. This is a 24 hour recording of your heart rhythm. You will be given a diary. Write down all symptoms that you have and what you were doing at the time you experienced symptoms. °· Arrhythmia monitor. This is a small device that your wear for several weeks. It records the heart rhythm when you have symptoms. °· Echocardiogram. This is an imaging test to help detect  abnormal heart structure such as congenital abnormalities, heart valve problems, or heart enlargement. °· Stress test. This test can help determine if the SVT is related to exercise. °· Electrophysiology study (EPS). This is a procedure that evaluates your heart's electrical system and can help your caregiver find the cause of your SVT. °TREATMENT  °Treatment of SVT depends on the symptoms, how often it recurs, and whether there are any underlying heart problems.  °· If symptoms are rare and no other cardiac disease is present, no treatment may be needed. °· Blood work may be done to check potassium, magnesium, and thyroid hormone levels to see if they are abnormal. If these levels are abnormal, treatment to correct the problems will occur. °Medicines °Your caregiver may use oral medicines to treat SVT. These medicines are given for long-term control of SVT. Medicines may be used alone or in combination with other treatments. These medicines work to slow nerve impulses in the heart muscle. These medicines can also be used to treat high blood pressure. Some of these medicines may include: °· Calcium channel blockers. °· Beta blockers. °· Digoxin. °Nonsurgical procedures °Nonsurgical techniques may be used if oral medicines do not work. Some examples include: °· Cardioversion. This technique uses either drugs or an electrical shock to restore a normal heart rhythm. °¨ Cardioversion drugs may be given through an intravenous (IV) line to help "reset" the heart rhythm. °¨ In electrical cardioversion, the caregiver shocks your heart to stop its beat for a split second. This helps to reset the heart to a normal rhythm. °· Ablation. This procedure is done under mild sedation. High frequency radio wave energy is used to   destroy the area of heart tissue responsible for the SVT. °HOME CARE INSTRUCTIONS  °· Do not smoke. °· Only take medicines prescribed by your caregiver. Check with your caregiver before using over-the-counter  medicines. °· Check with your caregiver about how much alcohol and caffeine (coffee, tea, colas, or chocolate) you may have. °· It is very important to keep all follow-up referrals and appointments in order to properly manage this problem. °SEEK IMMEDIATE MEDICAL CARE IF: °· You have dizziness. °· You faint or nearly faint. °· You have shortness of breath. °· You have chest pain or pressure. °· You have sudden nausea or vomiting. °· You have profuse sweating. °· You are concerned about how long your symptoms last. °· You are concerned about the frequency of your SVT episodes. °If you have the above symptoms, call your local emergency services (911 in [place] immediately. Do not drive yourself to the hospital. °MAKE SURE YOU:  °· Understand these instructions. °· Will watch your condition. °· Will get help right away if you are not doing well or get worse. °Document Released: 02/26/2005 Document Revised: 05/21/2011 Document Reviewed: 06/10/2008 °ExitCare® Patient Information ©2015 [employer]. This information is not intended to replace advice given to you by your health care provider. Make sure you discuss any questions you have with your health care provider. ° °

## 2014-03-28 LAB — CMP14+EGFR
ALT: 29 IU/L (ref 0–32)
AST: 19 IU/L (ref 0–40)
Albumin/Globulin Ratio: 1.8 (ref 1.1–2.5)
Albumin: 4.4 g/dL (ref 3.5–5.5)
Alkaline Phosphatase: 69 IU/L (ref 39–117)
BILIRUBIN TOTAL: 0.2 mg/dL (ref 0.0–1.2)
BUN / CREAT RATIO: 12 (ref 9–23)
BUN: 8 mg/dL (ref 6–24)
CALCIUM: 9.2 mg/dL (ref 8.7–10.2)
CHLORIDE: 105 mmol/L (ref 97–108)
CO2: 22 mmol/L (ref 18–29)
CREATININE: 0.68 mg/dL (ref 0.57–1.00)
GFR calc Af Amer: 125 mL/min/{1.73_m2} (ref >59)
GFR calc non Af Amer: 108 mL/min/{1.73_m2} (ref >59)
Globulin, Total: 2.5 g/dL (ref 1.5–4.5)
Glucose: 88 mg/dL (ref 65–99)
POTASSIUM: 4.1 mmol/L (ref 3.5–5.2)
Sodium: 143 mmol/L (ref 134–144)
TOTAL PROTEIN: 6.9 g/dL (ref 6.0–8.5)

## 2014-03-28 LAB — THYROID PANEL WITH TSH
Free Thyroxine Index: 2.4 (ref 1.2–4.9)
T3 Uptake Ratio: 27 % (ref 24–39)
T4, Total: 9 ug/dL (ref 4.5–12.0)
TSH: 3.44 u[IU]/mL (ref 0.450–4.500)

## 2014-03-29 ENCOUNTER — Other Ambulatory Visit: Payer: Self-pay

## 2014-03-29 MED ORDER — LEVOTHYROXINE SODIUM 112 MCG PO TABS: ORAL_TABLET | ORAL | Status: DC

## 2014-04-09 ENCOUNTER — Ambulatory Visit: Payer: 59 | Admitting: Nurse Practitioner

## 2014-04-09 DIAGNOSIS — Z0289 Encounter for other administrative examinations: Secondary | ICD-10-CM

## 2014-04-29 ENCOUNTER — Other Ambulatory Visit: Payer: Self-pay

## 2014-04-29 MED ORDER — CLONAZEPAM 0.5 MG PO TABS: 0.5000 mg | ORAL_TABLET | Freq: Two times a day (BID) | ORAL | Status: DC

## 2014-04-29 NOTE — Telephone Encounter (Signed)
Last seen 03/27/14 MMM   If approved route to nurse to call into [facility]  [id number]

## 2014-04-29 NOTE — Telephone Encounter (Signed)
rx called into pharmacy, pt aware.

## 2014-04-29 NOTE — Telephone Encounter (Signed)
Please call in klonopin with 1 refills 

## 2014-06-05 ENCOUNTER — Other Ambulatory Visit: Payer: Self-pay | Admitting: Nurse Practitioner

## 2014-06-08 ENCOUNTER — Other Ambulatory Visit: Payer: Self-pay | Admitting: Nurse Practitioner

## 2014-06-09 NOTE — Telephone Encounter (Signed)
Last office visit 03/27/14. Last prescribed Flexeril #30 on 02/25/14 with 2 refills and Motrin #90 in 01/2014.

## 2014-06-16 ENCOUNTER — Encounter: Payer: Self-pay | Admitting: Family

## 2014-06-16 ENCOUNTER — Ambulatory Visit (INDEPENDENT_AMBULATORY_CARE_PROVIDER_SITE_OTHER): Payer: 59 | Admitting: Family

## 2014-06-16 VITALS — BP 140/94 | HR 78 | Temp 99.2°F | Ht 66.0 in | Wt 230.0 lb

## 2014-06-16 DIAGNOSIS — J019 Acute sinusitis, unspecified: Secondary | ICD-10-CM | POA: Diagnosis not present

## 2014-06-16 MED ORDER — AMOXICILLIN-POT CLAVULANATE 875-125 MG PO TABS: 1.0000 | ORAL_TABLET | Freq: Two times a day (BID) | ORAL | Status: DC

## 2014-06-16 NOTE — Progress Notes (Signed)
Subjective:    Patient ID: [name] [name], female    DOB: 11-11-1970, 44 y.o.   MRN: [id number]  Sore Throat  Associated symptoms include congestion, coughing, ear pain, headaches, a hoarse voice, shortness of breath ("at times") and swollen glands.  Otalgia  Associated symptoms include coughing, headaches and a sore throat.  Sinus Problem This is a new problem. The current episode started 1 to 4 weeks ago. The problem has been gradually worsening since onset. There has been no fever. Her pain is at a severity of 9/10. The pain is moderate. Associated symptoms include congestion, coughing, ear pain, headaches, a hoarse voice, shortness of breath ("at times"), sinus pressure, sneezing, a sore throat and swollen glands. Past treatments include oral decongestants and lying down. The treatment provided mild relief.      Review of Systems  Constitutional: Negative.   HENT: Positive for congestion, ear pain, hoarse voice, sinus pressure, sneezing and sore throat.   Eyes: Negative.   Respiratory: Positive for cough and shortness of breath ("at times").   Cardiovascular: Negative.  Negative for palpitations.  Gastrointestinal: Negative.   Endocrine: Negative.   Genitourinary: Negative.   Musculoskeletal: Negative.   Neurological: Positive for headaches.  Hematological: Negative.   Psychiatric/Behavioral: Negative.   All other systems reviewed and are negative.      Objective:   Physical Exam  Constitutional: [name] oriented to person, place, and time. She appears well-developed and well-nourished. No distress.  HENT:  Head: Normocephalic and atraumatic.  Right Ear: External ear normal.  Nose: Right sinus exhibits maxillary sinus tenderness and frontal sinus tenderness. Left sinus exhibits maxillary sinus tenderness and frontal sinus tenderness.  Nasal passage erythemas with mild swelling  Oropharynx erythemas   Eyes: Pupils are equal, round, and reactive to light.  Neck: Normal  range of motion. Neck supple. No thyromegaly present.  Cardiovascular: Normal rate, regular rhythm, normal heart sounds and intact distal pulses.   No murmur heard. Pulmonary/Chest: Effort normal and breath sounds normal. No respiratory distress. She has no wheezes.  Abdominal: Soft. Bowel sounds are normal. She exhibits no distension. There is no tenderness.  Musculoskeletal: Normal range of motion. She exhibits no edema or tenderness.  Neurological: [name] alert and oriented to person, place, and time. She has normal reflexes. No cranial nerve deficit.  Skin: Skin is warm and dry.  Psychiatric: She has a normal mood and affect. Her behavior is normal. Judgment and thought content normal.  Vitals reviewed.     BP 140/94 mmHg  Pulse 78  Temp(Src) 99.2 F (37.3 C) (Oral)  Ht 5\' 6"  (1.676 m)  Wt 230 lb (104.327 kg)  BMI 37.14 kg/m2     Assessment & Plan:  1. Acute sinusitis, recurrence not specified, unspecified location -- Take meds as prescribed - Use a cool mist humidifier  -Use saline nose sprays frequently -Saline irrigations of the nose can be very helpful if done frequently.  * 4X daily for 1 week*  * Use of a nettie pot can be helpful with this. Follow directions with this* -Force fluids -For any cough or congestion  Use plain Mucinex- regular strength or max strength is fine   * Children- consult with Pharmacist for dosing -For fever or aces or pains- take tylenol or ibuprofen appropriate for age and weight.  * for fevers greater than 101 orally you may alternate ibuprofen and tylenol every  3 hours. -Throat lozenges if help -Continue allergy medication -No decongestant  - amoxicillin-clavulanate (AUGMENTIN)  875-125 MG per tablet; Take 1 tablet by mouth 2 (two) times daily.  Dispense: 14 tablet; Refill: 0  Evelina Dun, FNP

## 2014-06-16 NOTE — Patient Instructions (Signed)
Sinusitis Sinusitis is redness, soreness, and inflammation of the paranasal sinuses. Paranasal sinuses are air pockets within the bones of your face (beneath the eyes, the middle of the forehead, or above the eyes). In healthy paranasal sinuses, mucus is able to drain out, and air is able to circulate through them by way of your nose. However, when your paranasal sinuses are inflamed, mucus and air can become trapped. This can allow bacteria and other germs to grow and cause infection. Sinusitis can develop quickly and last only a short time (acute) or continue over a long period (chronic). Sinusitis that lasts for more than 12 weeks is considered chronic.  CAUSES  Causes of sinusitis include:  Allergies.  Structural abnormalities, such as displacement of the cartilage that separates your nostrils (deviated septum), which can decrease the air flow through your nose and sinuses and affect sinus drainage.  Functional abnormalities, such as when the small hairs (cilia) that line your sinuses and help remove mucus do not work properly or are not present. SIGNS AND SYMPTOMS  Symptoms of acute and chronic sinusitis are the same. The primary symptoms are pain and pressure around the affected sinuses. Other symptoms include:  Upper toothache.  Earache.  Headache.  Bad breath.  Decreased sense of smell and taste.  A cough, which worsens when you are lying flat.  Fatigue.  Fever.  Thick drainage from your nose, which often is green and may contain pus (purulent).  Swelling and warmth over the affected sinuses. DIAGNOSIS  Your health care provider will perform a physical exam. During the exam, your health care provider may:  Look in your nose for signs of abnormal growths in your nostrils (nasal polyps).  Tap over the affected sinus to check for signs of infection.  View the inside of your sinuses (endoscopy) using an imaging device that has a light attached (endoscope). If your health  care provider suspects that you have chronic sinusitis, one or more of the following tests may be recommended:  Allergy tests.  Nasal culture. A sample of mucus is taken from your nose, sent to a lab, and screened for bacteria.  Nasal cytology. A sample of mucus is taken from your nose and examined by your health care provider to determine if your sinusitis is related to an allergy. TREATMENT  Most cases of acute sinusitis are related to a viral infection and will resolve on their own within 10 days. Sometimes medicines are prescribed to help relieve symptoms (pain medicine, decongestants, nasal steroid sprays, or saline sprays).  However, for sinusitis related to a bacterial infection, your health care provider will prescribe antibiotic medicines. These are medicines that will help kill the bacteria causing the infection.  Rarely, sinusitis is caused by a fungal infection. In theses cases, your health care provider will prescribe antifungal medicine. For some cases of chronic sinusitis, surgery is needed. Generally, these are cases in which sinusitis recurs more than 3 times per year, despite other treatments. HOME CARE INSTRUCTIONS   Drink plenty of water. Water helps thin the mucus so your sinuses can drain more easily.  Use a humidifier.  Inhale steam 3 to 4 times a day (for example, sit in the bathroom with the shower running).  Apply a warm, moist washcloth to your face 3 to 4 times a day, or as directed by your health care provider.  Use saline nasal sprays to help moisten and clean your sinuses.  Take medicines only as directed by your health care provider.    If you were prescribed either an antibiotic or antifungal medicine, finish it all even if you start to feel better. SEEK IMMEDIATE MEDICAL CARE IF:  You have increasing pain or severe headaches.  You have nausea, vomiting, or drowsiness.  You have swelling around your face.  You have vision problems.  You have a stiff  neck.  You have difficulty breathing. MAKE SURE YOU:   Understand these instructions.  Will watch your condition.  Will get help right away if you are not doing well or get worse. Document Released: 02/26/2005 Document Revised: 07/13/2013 Document Reviewed: 03/13/2011 ExitCare Patient Information 2015 [employer]. This information is not intended to replace advice given to you by your health care provider. Make sure you discuss any questions you have with your health care provider.  - Take meds as prescribed - Use a cool mist humidifier  -Use saline nose sprays frequently -Saline irrigations of the nose can be very helpful if done frequently.  * 4X daily for 1 week*  * Use of a nettie pot can be helpful with this. Follow directions with this* -Force fluids -For any cough or congestion  Use plain Mucinex- regular strength or max strength is fine   * Children- consult with Pharmacist for dosing -For fever or aces or pains- take tylenol or ibuprofen appropriate for age and weight.  * for fevers greater than 101 orally you may alternate ibuprofen and tylenol every  3 hours. -Throat lozenges if help   Adasha Boehme, FNP  

## 2014-06-24 ENCOUNTER — Other Ambulatory Visit: Payer: Self-pay | Admitting: Nurse Practitioner

## 2014-06-24 DIAGNOSIS — Z1231 Encounter for screening mammogram for malignant neoplasm of breast: Secondary | ICD-10-CM

## 2014-06-29 ENCOUNTER — Telehealth: Payer: Self-pay | Admitting: Family

## 2014-06-29 MED ORDER — FLUCONAZOLE 150 MG PO TABS: 150.0000 mg | ORAL_TABLET | Freq: Once | ORAL | Status: DC

## 2014-06-29 NOTE — Telephone Encounter (Signed)
Left ,message,  Script sent in and note was faxed.

## 2014-06-29 NOTE — Telephone Encounter (Signed)
Prescription sent to pharmacy, and work note written and ready to be faxed.

## 2014-07-05 DIAGNOSIS — Z0289 Encounter for other administrative examinations: Secondary | ICD-10-CM

## 2014-08-04 ENCOUNTER — Other Ambulatory Visit: Payer: Self-pay | Admitting: Nurse Practitioner

## 2014-08-04 NOTE — Telephone Encounter (Signed)
RF called to pharmacy VM

## 2014-08-04 NOTE — Telephone Encounter (Signed)
Last filled 07/02/14, last seen 03/27/14. Pt uses [facility] [phone]

## 2014-08-04 NOTE — Telephone Encounter (Signed)
Please call in klonopin with 1 refills 

## 2014-08-12 ENCOUNTER — Ambulatory Visit (HOSPITAL_COMMUNITY)
Admission: RE | Admit: 2014-08-12 | Discharge: 2014-08-12 | Disposition: A | Discharge status: Home / Self Care | Payer: 59 | Source: Ambulatory Visit | Attending: Nurse Practitioner | Admitting: Nurse Practitioner

## 2014-08-12 DIAGNOSIS — Z1231 Encounter for screening mammogram for malignant neoplasm of breast: Secondary | ICD-10-CM | POA: Diagnosis not present

## 2014-09-01 ENCOUNTER — Other Ambulatory Visit: Payer: Self-pay

## 2014-09-01 MED ORDER — CLONAZEPAM 0.5 MG PO TABS: 0.5000 mg | ORAL_TABLET | Freq: Two times a day (BID) | ORAL | Status: DC

## 2014-09-01 MED ORDER — CYCLOBENZAPRINE HCL 10 MG PO TABS: 10.0000 mg | ORAL_TABLET | Freq: Three times a day (TID) | ORAL | Status: DC

## 2014-09-01 MED ORDER — PROPRANOLOL HCL 80 MG PO TABS: ORAL_TABLET | ORAL | Status: DC

## 2014-09-01 NOTE — Telephone Encounter (Signed)
Last seen 06/16/14 [name]  If approved route to nurse to call into [facility]  [phone]

## 2014-09-01 NOTE — Telephone Encounter (Signed)
Last seen 06/16/14 [facility]

## 2014-09-02 ENCOUNTER — Telehealth: Payer: Self-pay | Admitting: *Deleted

## 2014-09-02 NOTE — Telephone Encounter (Signed)
Script for klonipin called in. 

## 2014-09-09 ENCOUNTER — Encounter: Payer: Self-pay | Admitting: Physician Assistant

## 2014-09-09 ENCOUNTER — Ambulatory Visit (INDEPENDENT_AMBULATORY_CARE_PROVIDER_SITE_OTHER): Payer: 59 | Admitting: Physician Assistant

## 2014-09-09 VITALS — BP 129/84 | HR 73 | Temp 97.0°F | Ht 66.0 in | Wt 237.0 lb

## 2014-09-09 DIAGNOSIS — R05 Cough: Secondary | ICD-10-CM | POA: Diagnosis not present

## 2014-09-09 DIAGNOSIS — J069 Acute upper respiratory infection, unspecified: Secondary | ICD-10-CM

## 2014-09-09 DIAGNOSIS — J309 Allergic rhinitis, unspecified: Secondary | ICD-10-CM | POA: Diagnosis not present

## 2014-09-09 DIAGNOSIS — R059 Cough, unspecified: Secondary | ICD-10-CM

## 2014-09-09 DIAGNOSIS — J020 Streptococcal pharyngitis: Secondary | ICD-10-CM

## 2014-09-09 LAB — POCT RAPID STREP A (OFFICE): Rapid Strep A Screen: NEGATIVE

## 2014-09-09 LAB — POCT INFLUENZA A/B
Influenza A, POC: NEGATIVE
Influenza B, POC: NEGATIVE

## 2014-09-09 MED ORDER — BENZONATATE 200 MG PO CAPS: 200.0000 mg | ORAL_CAPSULE | Freq: Three times a day (TID) | ORAL | Status: DC | PRN

## 2014-09-09 MED ORDER — FLUTICASONE PROPIONATE 50 MCG/ACT NA SUSP: 2.0000 | Freq: Every day | NASAL | Status: DC

## 2014-09-09 MED ORDER — AZITHROMYCIN 250 MG PO TABS: ORAL_TABLET | ORAL | Status: DC

## 2014-09-09 NOTE — Progress Notes (Signed)
   Subjective:    Patient ID: [name] [name], female    DOB: Jul 29, 1970, 44 y.o.   MRN: [id number]  HPI 44 y/o female presents with c/o chest congestion, cough x 2 nights. Associated aches and pains. Diaphoresis. Has tried Musinex, Allegra and ibuprofen 800mg  and Hycodan with some relief.     Review of Systems  Constitutional: Positive for chills and diaphoresis.  HENT: Positive for congestion (chest), sinus pressure and sore throat. Negative for postnasal drip and rhinorrhea.   Respiratory: Positive for cough (dry, nonproductive ), chest tightness and wheezing. Negative for shortness of breath.   Cardiovascular: Negative for chest pain.  Gastrointestinal: Negative.   Musculoskeletal: Positive for myalgias.  Neurological: Positive for headaches.  All other systems reviewed and are negative.      Objective:   Physical Exam  Constitutional: [name] oriented to person, place, and time. She appears well-developed and well-nourished. No distress.  HENT:  Head: Normocephalic.  Submandibular adenopathy, more prominent on right side.   Nasal turbinates erythematous and boggy  Posterior pharynx erythema bilaterally   Cardiovascular: Normal rate, regular rhythm and normal heart sounds.  Exam reveals no gallop and no friction rub.   No murmur heard. Neurological: [name] alert and oriented to person, place, and time.  Skin: [name] not diaphoretic.  Psychiatric: She has a normal mood and affect. Her behavior is normal. Judgment and thought content normal.  Nursing note and vitals reviewed.         Assessment & Plan:  1. Streptococcal sore throat  - POCT rapid strep A - Culture, Group A Strep  2. Cough  - POCT Influenza A/B - benzonatate (TESSALON) 200 MG capsule; Take 1 capsule (200 mg total) by mouth 3 (three) times daily as needed for cough.  Dispense: 20 capsule; Refill: 0  3. Allergic rhinitis, unspecified allergic rhinitis type  - fluticasone (FLONASE) 50 MCG/ACT nasal  spray; Place 2 sprays into both nostrils daily.  Dispense: 16 g; Refill: 6  4. Acute upper respiratory infection  - azithromycin (ZITHROMAX) 250 MG tablet; 2 tablets on day 1. 1 tablet on day 2-5  Dispense: 6 tablet; Refill: 0   F/U if symptoms worsen or DNI  Continue Allegra, Musinex and ibuprofen  Cool mist humidifier   Shawntrice Salle A. Benjamin Stain PA-C

## 2014-09-12 LAB — CULTURE, GROUP A STREP: Strep A Culture: NEGATIVE

## 2014-09-14 ENCOUNTER — Telehealth: Payer: Self-pay | Admitting: Physician Assistant

## 2014-09-14 NOTE — Telephone Encounter (Signed)
Please advise 

## 2014-09-15 ENCOUNTER — Encounter: Payer: Self-pay | Admitting: *Deleted

## 2014-09-15 NOTE — Telephone Encounter (Signed)
Approved work note for Fri/Sat/Sun She was treated for URI . Thanks  Sharonda Llamas A. Benjamin Stain PA-C

## 2014-09-15 NOTE — Telephone Encounter (Signed)
lmovm that work note is at the front desk ready for pickup

## 2014-09-16 NOTE — Telephone Encounter (Signed)
Work note faxed to employer per pt request

## 2014-09-23 DIAGNOSIS — Z0289 Encounter for other administrative examinations: Secondary | ICD-10-CM

## 2014-10-22 ENCOUNTER — Other Ambulatory Visit: Payer: Self-pay | Admitting: Nurse Practitioner

## 2014-11-04 ENCOUNTER — Telehealth: Payer: Self-pay | Admitting: Nurse Practitioner

## 2014-11-04 DIAGNOSIS — K219 Gastro-esophageal reflux disease without esophagitis: Secondary | ICD-10-CM

## 2014-11-04 DIAGNOSIS — G4730 Sleep apnea, unspecified: Secondary | ICD-10-CM

## 2014-11-04 NOTE — Telephone Encounter (Signed)
Do you want to do a referral for sleep study? Also she wants gastro referral

## 2014-11-04 NOTE — Telephone Encounter (Signed)
Referrals made

## 2014-11-05 ENCOUNTER — Encounter: Payer: Self-pay | Admitting: Physician Assistant

## 2014-11-11 ENCOUNTER — Encounter: Payer: Self-pay | Admitting: Family Medicine

## 2014-11-11 ENCOUNTER — Ambulatory Visit (INDEPENDENT_AMBULATORY_CARE_PROVIDER_SITE_OTHER): Payer: 59 | Admitting: Family Medicine

## 2014-11-11 VITALS — BP 118/74 | HR 80 | Temp 98.6°F | Ht 66.0 in | Wt 236.0 lb

## 2014-11-11 DIAGNOSIS — M545 Low back pain, unspecified: Secondary | ICD-10-CM

## 2014-11-11 MED ORDER — MELOXICAM 7.5 MG PO TABS: 7.5000 mg | ORAL_TABLET | Freq: Every day | ORAL | Status: DC

## 2014-11-11 MED ORDER — METHYLPREDNISOLONE ACETATE 40 MG/ML IJ SUSP
40.0000 mg | Freq: Once | INTRAMUSCULAR | Status: AC
  Administered 2014-11-11: 40 mg via INTRAMUSCULAR

## 2014-11-11 NOTE — Progress Notes (Signed)
BP 118/74 mmHg  Pulse 80  Temp(Src) 98.6 F (37 C) (Oral)  Ht 5\' 6"  (1.676 m)  Wt 236 lb (107.049 kg)  BMI 38.11 kg/m2   Subjective:    Patient ID: [name] [name], female    DOB: 08/31/1970, 44 y.o.   MRN: [id number]  HPI: [name] [name] is a 44 y.o. female presenting on 11/11/2014 for Back Pain   HPI Low back pain Patient presents today with bilateral lower back pain that started 2 days ago. The day prior she had neck issues and laid in bed all day because of her neck issues and then her back pain developed over the next day. She says she has pain worse on the right lower back than the left and goes down into her right posterior buttock. She denies any pain shooting down into her leg or foot. She denies any tingling or numbness. She denies any fevers or chills. She has had this previously and has received a steroid shot which helped greatly. She admits to being very tight in her back and not doing stretching or exercises very frequently.  Relevant past medical, surgical, family and social history reviewed and updated as indicated. Interim medical history since our last visit reviewed. Allergies and medications reviewed and updated.  Review of Systems  Constitutional: Negative for fever and chills.  HENT: Negative for congestion, ear discharge and ear pain.   Eyes: Negative for redness and visual disturbance.  Respiratory: Negative for chest tightness and shortness of breath.   Cardiovascular: Negative for chest pain and leg swelling.  Genitourinary: Negative for dysuria and difficulty urinating.  Musculoskeletal: Positive for back pain. Negative for myalgias, arthralgias and gait problem.  Skin: Negative for rash.  Neurological: Negative for weakness, light-headedness, numbness and headaches.  Psychiatric/Behavioral: Negative for behavioral problems and agitation.  All other systems reviewed and are negative.   Per HPI unless specifically indicated above     Medication List       This list is accurate as of: 11/11/14  4:02 PM.  Always use your most recent med list.               clonazePAM 0.5 MG tablet  Commonly known as:  KLONOPIN  Take 1 tablet (0.5 mg total) by mouth 2 (two) times daily.     cyclobenzaprine 10 MG tablet  Commonly known as:  FLEXERIL  Take 1 tablet (10 mg total) by mouth 3 (three) times daily.     fexofenadine 180 MG tablet  Commonly known as:  ALLEGRA  Take 180 mg by mouth daily.     fluticasone 50 MCG/ACT nasal spray  Commonly known as:  FLONASE  Place 2 sprays into both nostrils daily.     ibuprofen 800 MG tablet  Commonly known as:  ADVIL,MOTRIN  TAKE 1 TABLET BY MOUTH EVERY SIX HOURS AS NEEDED     levothyroxine 112 MCG tablet  Commonly known as:  SYNTHROID, LEVOTHROID  TAKE 1 TABLET BY MOUTH EVERY DAY BEFORE BREAKFAST     medroxyPROGESTERone 150 MG/ML injection  Commonly known as:  DEPO-PROVERA     meloxicam 7.5 MG tablet  Commonly known as:  MOBIC  Take 1 tablet (7.5 mg total) by mouth daily.     propranolol 80 MG tablet  Commonly known as:  INDERAL  Take one daily     SUMAtriptan 50 MG tablet  Commonly known as:  IMITREX  1 at headache onset may repeat in 2 hours X1  Objective:    BP 118/74 mmHg  Pulse 80  Temp(Src) 98.6 F (37 C) (Oral)  Ht 5\' 6"  (1.676 m)  Wt 236 lb (107.049 kg)  BMI 38.11 kg/m2  Wt Readings from Last 3 Encounters:  11/11/14 236 lb (107.049 kg)  09/09/14 237 lb (107.502 kg)  06/16/14 230 lb (104.327 kg)    Physical Exam  Constitutional: [name] oriented to person, place, and time. She appears well-developed and well-nourished. No distress.  Eyes: Conjunctivae and EOM are normal. Pupils are equal, round, and reactive to light.  Cardiovascular: Normal rate, regular rhythm and normal heart sounds.   No murmur heard. Pulmonary/Chest: Effort normal and breath sounds normal. No respiratory distress. She has no wheezes.  Musculoskeletal: Normal range of motion. She exhibits  tenderness (bilateral lower back paraspinal tenderness). She exhibits no edema.  Neurological: [name] alert and oriented to person, place, and time. She displays normal reflexes. She exhibits normal muscle tone. Coordination normal.  Skin: Skin is warm and dry. No rash noted. [name] not diaphoretic.  Psychiatric: She has a normal mood and affect. Her behavior is normal.  Vitals reviewed.   Results for orders placed or performed in visit on 09/09/14  Culture, Group A Strep  Result Value Ref Range   Strep A Culture Negative   POCT rapid strep A  Result Value Ref Range   Rapid Strep A Screen Negative Negative  POCT Influenza A/B  Result Value Ref Range   Influenza A, POC Negative Negative   Influenza B, POC Negative Negative      Assessment & Plan:   Problem List Items Addressed This Visit      Other   Lumbago - Primary   Relevant Medications   meloxicam (MOBIC) 7.5 MG tablet   methylPREDNISolone acetate (DEPO-MEDROL) injection 40 mg (Completed)   Other Relevant Orders   Ambulatory referral to Physical Therapy       Follow up plan: Return if symptoms worsen or fail to improve.  Caryl Pina, MD [facility] 11/11/2014, 4:02 PM

## 2014-11-11 NOTE — Patient Instructions (Signed)
Back Pain, Adult Low back pain is very common. About 1 in 5 people have back pain.The cause of low back pain is rarely dangerous. The pain often gets better over time.About half of people with a sudden onset of back pain feel better in just 2 weeks. About 8 in 10 people feel better by 6 weeks.  CAUSES Some common causes of back pain include:  Strain of the muscles or ligaments supporting the spine.  Wear and tear (degeneration) of the spinal discs.  Arthritis.  Direct injury to the back. DIAGNOSIS Most of the time, the direct cause of low back pain is not known.However, back pain can be treated effectively even when the exact cause of the pain is unknown.Answering your caregiver's questions about your overall health and symptoms is one of the most accurate ways to make sure the cause of your pain is not dangerous. If your caregiver needs more information, he or she may order lab work or imaging tests (X-rays or MRIs).However, even if imaging tests show changes in your back, this usually does not require surgery. HOME CARE INSTRUCTIONS For many people, back pain returns.Since low back pain is rarely dangerous, it is often a condition that people can learn to manageon their own.   Remain active. It is stressful on the back to sit or stand in one place. Do not sit, drive, or stand in one place for more than 30 minutes at a time. Take short walks on level surfaces as soon as pain allows.Try to increase the length of time you walk each day.  Do not stay in bed.Resting more than 1 or 2 days can delay your recovery.  Do not avoid exercise or work.Your body is made to move.It is not dangerous to be active, even though your back may hurt.Your back will likely heal faster if you return to being active before your pain is gone.  Pay attention to your body when you bend and lift. Many people have less discomfortwhen lifting if they bend their knees, keep the load close to their bodies,and  avoid twisting. Often, the most comfortable positions are those that put less stress on your recovering back.  Find a comfortable position to sleep. Use a firm mattress and lie on your side with your knees slightly bent. If you lie on your back, put a pillow under your knees.  Only take over-the-counter or prescription medicines as directed by your caregiver. Over-the-counter medicines to reduce pain and inflammation are often the most helpful.Your caregiver may prescribe muscle relaxant drugs.These medicines help dull your pain so you can more quickly return to your normal activities and healthy exercise.  Put ice on the injured area.  Put ice in a plastic bag.  Place a towel between your skin and the bag.  Leave the ice on for 15-20 minutes, 03-04 times a day for the first 2 to 3 days. After that, ice and heat may be alternated to reduce pain and spasms.  Ask your caregiver about trying back exercises and gentle massage. This may be of some benefit.  Avoid feeling anxious or stressed.Stress increases muscle tension and can worsen back pain.It is important to recognize when you are anxious or stressed and learn ways to manage it.Exercise is a great option. SEEK MEDICAL CARE IF:  You have pain that is not relieved with rest or medicine.  You have pain that does not improve in 1 week.  You have new symptoms.  You are generally not feeling well. SEEK   IMMEDIATE MEDICAL CARE IF:   You have pain that radiates from your back into your legs.  You develop new bowel or bladder control problems.  You have unusual weakness or numbness in your arms or legs.  You develop nausea or vomiting.  You develop abdominal pain.  You feel faint. Document Released: 02/26/2005 Document Revised: 08/28/2011 Document Reviewed: 06/30/2013 ExitCare Patient Information 2015 [employer]. This information is not intended to replace advice given to you by your health care provider. Make sure you  discuss any questions you have with your health care provider.  

## 2014-11-24 ENCOUNTER — Other Ambulatory Visit (INDEPENDENT_AMBULATORY_CARE_PROVIDER_SITE_OTHER): Payer: 59

## 2014-11-24 ENCOUNTER — Encounter: Payer: Self-pay | Admitting: Physician Assistant

## 2014-11-24 ENCOUNTER — Ambulatory Visit (INDEPENDENT_AMBULATORY_CARE_PROVIDER_SITE_OTHER): Payer: 59 | Admitting: Physician Assistant

## 2014-11-24 VITALS — BP 118/72 | HR 80 | Ht 65.55 in | Wt 236.0 lb

## 2014-11-24 DIAGNOSIS — R6881 Early satiety: Secondary | ICD-10-CM

## 2014-11-24 DIAGNOSIS — K589 Irritable bowel syndrome without diarrhea: Secondary | ICD-10-CM

## 2014-11-24 DIAGNOSIS — K219 Gastro-esophageal reflux disease without esophagitis: Secondary | ICD-10-CM

## 2014-11-24 DIAGNOSIS — R140 Abdominal distension (gaseous): Secondary | ICD-10-CM

## 2014-11-24 DIAGNOSIS — R1013 Epigastric pain: Secondary | ICD-10-CM

## 2014-11-24 DIAGNOSIS — R1310 Dysphagia, unspecified: Secondary | ICD-10-CM | POA: Diagnosis not present

## 2014-11-24 LAB — LIPASE: LIPASE: 32 U/L (ref 11.0–59.0)

## 2014-11-24 LAB — HEPATIC FUNCTION PANEL
ALBUMIN: 4.6 g/dL (ref 3.5–5.2)
ALK PHOS: 50 U/L (ref 39–117)
ALT: 22 U/L (ref 0–35)
AST: 17 U/L (ref 0–37)
BILIRUBIN DIRECT: 0.1 mg/dL (ref 0.0–0.3)
Total Bilirubin: 0.4 mg/dL (ref 0.2–1.2)
Total Protein: 8 g/dL (ref 6.0–8.3)

## 2014-11-24 LAB — AMYLASE: AMYLASE: 36 U/L (ref 27–131)

## 2014-11-24 MED ORDER — PANTOPRAZOLE SODIUM 40 MG PO TBEC: 40.0000 mg | DELAYED_RELEASE_TABLET | Freq: Every day | ORAL | Status: DC

## 2014-11-24 MED ORDER — RIFAXIMIN 550 MG PO TABS: 550.0000 mg | ORAL_TABLET | Freq: Three times a day (TID) | ORAL | Status: DC

## 2014-11-24 MED ORDER — HYOSCYAMINE SULFATE 0.125 MG SL SUBL: 0.1250 mg | SUBLINGUAL_TABLET | Freq: Three times a day (TID) | SUBLINGUAL | Status: DC | PRN

## 2014-11-24 MED ORDER — RIFAXIMIN 550 MG PO TABS: 550.0000 mg | ORAL_TABLET | Freq: Two times a day (BID) | ORAL | Status: DC

## 2014-11-24 NOTE — Progress Notes (Signed)
Patient ID: [name] [name], female   DOB: 1970-11-01, 44 y.o.   MRN: [id number]    HPI:  [name] [name] is a 44 y.o.   female referred by Hassell Done, Mary-Margaret, * for evaluation of acid reflux. [name] [name] has a history of hypothyroidism, anxiety, palpitations, chronic headaches, hyperlipidemia, and colon polyps. [name] known to Dr. Henrene Pastor with a history of rectal pain and rectal bleeding. She had a colonoscopy on 07/24/2013 at which time a diminutive polyp was removed from the descending colon and it was an otherwise normal colonoscopy. Pathology revealed a sessile serrated polyp with no evidence of malignancy. She was advised to have surveillance in 5 years.  [name] here today with complaints of reflux. She states she has had reflux since her early 57s. Initially she would use toms or Rolaids with varying degrees of relief. She states she often felt as if she had a golf ball in her throat. Over the past 2 years her heartburn has been getting worse and she has been getting frequent regurgitation. She frequently wakes up in the middle of the night with acidic  fluid coming out of her nose and fluid coming out of her nose. Her throat burns and she wakes up choking. [name] hoarse in the morning and has a sore throat each morning. She has been on ranitidine with little relief. She has tried Prevacid OTC and Nexium OTC without relief she typically would take them in the evening because she works a swing shift, but she never took them regularly. She also reports that she has been having difficulty swallowing solids like meats and dry foods and occasionally has dysphagia to liquids. She notes that ingestion of very cold things will sometimes cause her to get chest pain. She has no epigastric pain. She admits to waves of nausea that are worse after meals and states she feels full after 2 or 3 bites of food. Her weight has been stable.  She has a history of IBS and reports that over the past several months she has been getting  left lower quadrant abdominal pain prior to defecation, relieved with defecation. She also has been having episodes at least once weekly will she will have rectal spasm prior to a bowel movement, again relieved with defecation. She has had no bright red blood per rectum or melena. She does feel as if she is very gassy and bloated. She does follow with GYN regularly.   Past Medical History  Diagnosis Date  . Hypothyroidism   . Anxiety   . Palpitations   . Chronic headaches   . Colon polyps   . Hyperlipidemia     Past Surgical History  Procedure Laterality Date  . Cesarean section      1194   Family History  Problem Relation Age of Onset  . Heart disease Maternal Grandfather   . Heart disease Maternal Grandmother   . Lung cancer Father     died from  . Breast cancer Paternal Grandmother     paternal aunts x2  . Hypertension Mother    Social History  Substance Use Topics  . Smoking status: Former Smoker -- 0.50 packs/day for 7 years    Types: Cigarettes    Start date: 04/21/2008    Quit date: 06/10/2014  . Smokeless tobacco: Current User     Comment: patient has patches but has not began yet, has program through her employer  . Alcohol Use: 0.0 oz/week    0 Standard drinks or equivalent per  week     Comment: rare   Current Outpatient Prescriptions  Medication Sig Dispense Refill  . clonazePAM (KLONOPIN) 0.5 MG tablet Take 1 tablet (0.5 mg total) by mouth 2 (two) times daily. 60 tablet 0  . cyclobenzaprine (FLEXERIL) 10 MG tablet Take 1 tablet (10 mg total) by mouth 3 (three) times daily. 30 tablet 2  . fexofenadine (ALLEGRA) 180 MG tablet Take 180 mg by mouth daily.    . fluticasone (FLONASE) 50 MCG/ACT nasal spray Place 2 sprays into both nostrils daily. (Patient taking differently: Place 2 sprays into both nostrils as needed. ) 16 g 6  . ibuprofen (ADVIL,MOTRIN) 800 MG tablet TAKE 1 TABLET BY MOUTH EVERY SIX HOURS AS NEEDED 90 tablet 0  . levothyroxine (SYNTHROID,  LEVOTHROID) 112 MCG tablet TAKE 1 TABLET BY MOUTH EVERY DAY BEFORE BREAKFAST 30 tablet 11  . medroxyPROGESTERone (DEPO-PROVERA) 150 MG/ML injection     . propranolol (INDERAL) 80 MG tablet Take one daily 30 tablet 2  . ranitidine (ZANTAC) 150 MG tablet Take 150 mg by mouth daily.    . SUMAtriptan (IMITREX) 50 MG tablet 1 at headache onset may repeat in 2 hours X1 10 tablet 1  . hyoscyamine (LEVSIN SL) 0.125 MG SL tablet Place 1 tablet (0.125 mg total) under the tongue every 8 (eight) hours as needed for cramping. 90 tablet 2  . pantoprazole (PROTONIX) 40 MG tablet Take 1 tablet (40 mg total) by mouth daily. 30 min prior to breakfast 30 tablet 4  . rifaximin (XIFAXAN) 550 MG TABS tablet Take 1 tablet (550 mg total) by mouth 3 (three) times daily. For 14 days 42 tablet 0   No current facility-administered medications for this visit.   No Known Allergies   Review of Systems: Gen: Denies any fever, chills, sweats, anorexia, fatigue, weakness, malaise, weight loss, and sleep disorder CV: Denies chest pain, angina, palpitations, syncope, orthopnea, PND, peripheral edema, and claudication. Resp: Denies dyspnea at rest, dyspnea with exercise, cough, sputum, wheezing, coughing up blood, and pleurisy. GI: Denies vomiting blood, jaundice, and fecal incontinence. Has dysphagia to solids. GU : Denies urinary burning, blood in urine, urinary frequency, urinary hesitancy, nocturnal urination, and urinary incontinence. MS: Denies joint pain, limitation of movement, and swelling, stiffness, low back pain, extremity pain. Denies muscle weakness, cramps, atrophy.  Derm: Denies rash, itching, dry skin, hives, moles, warts, or unhealing ulcers.  Psych: Denies depression, anxiety, memory loss, suicidal ideation, hallucinations, paranoia, and confusion. Heme: Denies bruising, bleeding, and enlarged lymph nodes. Neuro:  Denies any headaches, dizziness, paresthesias. Endo:  Denies any problems with DM,  adrenal  function  Prior Endoscopies:   See history of present illness.  Physical Exam: BP 118/72 mmHg  Pulse 80  Ht 5' 5.55" (1.665 m)  Wt 236 lb (107.049 kg)  BMI 38.61 kg/m2 Constitutional: Pleasant,well-developed, female in no acute distress. HEENT: Normocephalic and atraumatic. Conjunctivae are normal. No scleral icterus. Neck supple. No JVD Cardiovascular: Normal rate, regular rhythm.  Pulmonary/chest: Effort normal and breath sounds normal. No wheezing, rales or rhonchi. Abdominal: Soft, nondistended, nontender. Bowel sounds active throughout. There are no masses palpable. No hepatomegaly. Extremities: no edema Lymphadenopathy: No cervical adenopathy noted. Neurological: Alert and oriented to person place and time. Skin: Skin is warm and dry. No rashes noted. Psychiatric: Normal mood and affect. Behavior is normal.  ASSESSMENT AND PLAN: #1. GERD, dysphagia, early satiety, and epigastric pain. These may all be related to poorly controlled reflux. An antireflux regimen has been reviewed at length.  She will be given a trial of pantoprazole 40 mg 1 by mouth every morning 30 minutes prior to breakfast. She will be scheduled for a barium swallow with tablet to evaluate for stricture, esophageal wall thickening, dysmotility, etc. She will then be scheduled for an EGD to evaluate for esophagitis, gastritis, ulcer, etc. as well as to perform a dilation if necessary.The risks, benefits, and alternatives to endoscopy with possible biopsy and possible dilation were discussed with the patient and they consent to proceed.   #2. IBS. This is likely the cause of her bloating and abdominal cramping. She's been instructed to adhere to a high-fiber, low-fat diet. She will be given a trial of Levsin 0.12 mg one sublingually every 8 hours when necessary cramps or spasm. She will also be given an empiric trial of Xifaxan 550 mg 3 times a day for 14 days to see if this helps diminish her gas and  bloating.  Follow-up recommendations will be made pending the findings of her barium swallow and EGD.    Evola Hollis, Deloris Ping 11/24/2014, 4:27 PM  CC: Hassell Done, Mary-Margaret, *

## 2014-11-24 NOTE — Patient Instructions (Addendum)
We have sent the following medications to your pharmacy for you to pick up at your convenience: Xifaxin Pantoprazole Levsin  Your physician has requested that you go to the basement for the following lab work before leaving today: Amylase, Lipase, hepatic  You have been scheduled for an endoscopy. Please follow written instructions given to you at your visit today. If you use inhalers (even only as needed), please bring them with you on the day of your procedure. Food Choices for Gastroesophageal Reflux Disease When you have gastroesophageal reflux disease (GERD), the foods you eat and your eating habits are very important. Choosing the right foods can help ease the discomfort of GERD. WHAT GENERAL GUIDELINES DO I NEED TO FOLLOW?  Choose fruits, vegetables, whole grains, low-fat dairy products, and low-fat meat, fish, and poultry.  Limit fats such as oils, salad dressings, butter, nuts, and avocado.  Keep a food diary to identify foods that cause symptoms.  Avoid foods that cause reflux. These may be different for different people.  Eat frequent small meals instead of three large meals each day.  Eat your meals slowly, in a relaxed setting.  Limit fried foods.  Cook foods using methods other than frying.  Avoid drinking alcohol.  Avoid drinking large amounts of liquids with your meals.  Avoid bending over or lying down until 2-3 hours after eating. WHAT FOODS ARE NOT RECOMMENDED? The following are some foods and drinks that may worsen your symptoms: Vegetables Tomatoes. Tomato juice. Tomato and spaghetti sauce. Chili peppers. Onion and garlic. Horseradish. Fruits Oranges, grapefruit, and lemon (fruit and juice). Meats High-fat meats, fish, and poultry. This includes hot dogs, ribs, ham, sausage, salami, and bacon. Dairy Whole milk and chocolate milk. Sour cream. Cream. Butter. Ice cream. Cream cheese.  Beverages Coffee and tea, with or without caffeine. Carbonated  beverages or energy drinks. Condiments Hot sauce. Barbecue sauce.  Sweets/Desserts Chocolate and cocoa. Donuts. Peppermint and spearmint. Fats and Oils High-fat foods, including [place] fries and potato chips. Other Vinegar. Strong spices, such as black pepper, white pepper, red pepper, cayenne, curry powder, cloves, ginger, and chili powder. The items listed above may not be a complete list of foods and beverages to avoid. Contact your dietitian for more information. Document Released: 02/26/2005 Document Revised: 03/03/2013 Document Reviewed: 12/31/2012 [facility] Patient Information 2015 [place], [place]. This information is not intended to replace advice given to you by your health care provider. Make sure you discuss any questions you have with your health care provider.  You have been scheduled for a Barium Esophogram at [facility] (1st floor of the hospital) on 12-02-14 at 10:00am. Please arrive 15 minutes prior to your appointment for registration. Make certain not to have anything to eat or drink 6 hours prior to your test. If you need to reschedule for any reason, please contact radiology at [phone] to do so. __________________________________________________________________ A barium swallow is an examination that concentrates on views of the esophagus. This tends to be a double contrast exam (barium and two liquids which, when combined, create a gas to distend the wall of the oesophagus) or single contrast (non-ionic iodine based). The study is usually tailored to your symptoms so a good history is essential. Attention is paid during the study to the form, structure and configuration of the esophagus, looking for functional disorders (such as aspiration, dysphagia, achalasia, motility and reflux) EXAMINATION You may be asked to change into a gown, depending on the type of swallow being performed. A radiologist and radiographer  will perform the procedure. The radiologist will  advise you of the type of contrast selected for your procedure and direct you during the exam. You will be asked to stand, sit or lie in several different positions and to hold a small amount of fluid in your mouth before being asked to swallow while the imaging is performed .In some instances you may be asked to swallow barium coated marshmallows to assess the motility of a solid food bolus. The exam can be recorded as a digital or video fluoroscopy procedure. POST PROCEDURE It will take 1-2 days for the barium to pass through your system. To facilitate this, it is important, unless otherwise directed, to increase your fluids for the next 24-48hrs and to resume your normal diet.  This test typically takes about 30 minutes to perform. __________________________________________________________________________________

## 2014-11-30 NOTE — Progress Notes (Signed)
Agree with initial assessment and plans as outlined 

## 2014-12-02 ENCOUNTER — Ambulatory Visit (HOSPITAL_COMMUNITY)
Admission: RE | Admit: 2014-12-02 | Discharge: 2014-12-02 | Disposition: A | Discharge status: Home / Self Care | Payer: 59 | Source: Ambulatory Visit | Attending: Physician Assistant | Admitting: Physician Assistant

## 2014-12-02 DIAGNOSIS — R1013 Epigastric pain: Secondary | ICD-10-CM

## 2014-12-02 DIAGNOSIS — R6881 Early satiety: Secondary | ICD-10-CM

## 2014-12-02 DIAGNOSIS — K589 Irritable bowel syndrome without diarrhea: Secondary | ICD-10-CM

## 2014-12-02 DIAGNOSIS — K219 Gastro-esophageal reflux disease without esophagitis: Secondary | ICD-10-CM | POA: Diagnosis not present

## 2014-12-02 DIAGNOSIS — K449 Diaphragmatic hernia without obstruction or gangrene: Secondary | ICD-10-CM | POA: Diagnosis not present

## 2014-12-02 DIAGNOSIS — R140 Abdominal distension (gaseous): Secondary | ICD-10-CM

## 2014-12-02 DIAGNOSIS — R1310 Dysphagia, unspecified: Secondary | ICD-10-CM

## 2014-12-07 ENCOUNTER — Other Ambulatory Visit: Payer: Self-pay | Admitting: Family

## 2014-12-07 ENCOUNTER — Other Ambulatory Visit: Payer: Self-pay | Admitting: Nurse Practitioner

## 2014-12-08 MED ORDER — PROPRANOLOL HCL 80 MG PO TABS: ORAL_TABLET | ORAL | Status: DC

## 2014-12-08 NOTE — Telephone Encounter (Signed)
done

## 2014-12-08 NOTE — Telephone Encounter (Signed)
Last seen 11/11/14  Dr Dettinger  If approved route to nurse to call into [facility]   [phone]

## 2014-12-09 NOTE — Telephone Encounter (Signed)
Refill printed 

## 2014-12-09 NOTE — Telephone Encounter (Signed)
rx called into pharmacy

## 2014-12-11 HISTORY — PX: LITHOTRIPSY: SUR834

## 2015-01-08 ENCOUNTER — Other Ambulatory Visit: Payer: Self-pay | Admitting: Nurse Practitioner

## 2015-01-10 ENCOUNTER — Encounter: Payer: Self-pay | Admitting: Family Medicine

## 2015-01-10 ENCOUNTER — Telehealth: Payer: Self-pay | Admitting: Internal Medicine

## 2015-01-10 ENCOUNTER — Ambulatory Visit (INDEPENDENT_AMBULATORY_CARE_PROVIDER_SITE_OTHER): Payer: 59 | Admitting: Family Medicine

## 2015-01-10 VITALS — BP 128/92 | HR 110 | Temp 98.3°F | Ht 66.0 in | Wt 238.0 lb

## 2015-01-10 DIAGNOSIS — J0100 Acute maxillary sinusitis, unspecified: Secondary | ICD-10-CM

## 2015-01-10 MED ORDER — BENZONATATE 100 MG PO CAPS: 100.0000 mg | ORAL_CAPSULE | Freq: Two times a day (BID) | ORAL | Status: DC | PRN

## 2015-01-10 MED ORDER — AMOXICILLIN-POT CLAVULANATE 875-125 MG PO TABS: 1.0000 | ORAL_TABLET | Freq: Two times a day (BID) | ORAL | Status: DC

## 2015-01-10 NOTE — Patient Instructions (Signed)
Great to meet you!  Come back if you do not get better as expected or get worse.   Sinusitis, Adult Sinusitis is redness, soreness, and inflammation of the paranasal sinuses. Paranasal sinuses are air pockets within the bones of your face. They are located beneath your eyes, in the middle of your forehead, and above your eyes. In healthy paranasal sinuses, mucus is able to drain out, and air is able to circulate through them by way of your nose. However, when your paranasal sinuses are inflamed, mucus and air can become trapped. This can allow bacteria and other germs to grow and cause infection. Sinusitis can develop quickly and last only a short time (acute) or continue over a long period (chronic). Sinusitis that lasts for more than 12 weeks is considered chronic. CAUSES Causes of sinusitis include:  Allergies.  Structural abnormalities, such as displacement of the cartilage that separates your nostrils (deviated septum), which can decrease the air flow through your nose and sinuses and affect sinus drainage.  Functional abnormalities, such as when the small hairs (cilia) that line your sinuses and help remove mucus do not work properly or are not present. SIGNS AND SYMPTOMS Symptoms of acute and chronic sinusitis are the same. The primary symptoms are pain and pressure around the affected sinuses. Other symptoms include:  Upper toothache.  Earache.  Headache.  Bad breath.  Decreased sense of smell and taste.  A cough, which worsens when you are lying flat.  Fatigue.  Fever.  Thick drainage from your nose, which often is green and may contain pus (purulent).  Swelling and warmth over the affected sinuses. DIAGNOSIS Your health care provider will perform a physical exam. During your exam, your health care provider may perform any of the following to help determine if you have acute sinusitis or chronic sinusitis:  Look in your nose for signs of abnormal growths in your  nostrils (nasal polyps).  Tap over the affected sinus to check for signs of infection.  View the inside of your sinuses using an imaging device that has a light attached (endoscope). If your health care provider suspects that you have chronic sinusitis, one or more of the following tests may be recommended:  Allergy tests.  Nasal culture. A sample of mucus is taken from your nose, sent to a lab, and screened for bacteria.  Nasal cytology. A sample of mucus is taken from your nose and examined by your health care provider to determine if your sinusitis is related to an allergy. TREATMENT Most cases of acute sinusitis are related to a viral infection and will resolve on their own within 10 days. Sometimes, medicines are prescribed to help relieve symptoms of both acute and chronic sinusitis. These may include pain medicines, decongestants, nasal steroid sprays, or saline sprays. However, for sinusitis related to a bacterial infection, your health care provider will prescribe antibiotic medicines. These are medicines that will help kill the bacteria causing the infection. Rarely, sinusitis is caused by a fungal infection. In these cases, your health care provider will prescribe antifungal medicine. For some cases of chronic sinusitis, surgery is needed. Generally, these are cases in which sinusitis recurs more than 3 times per year, despite other treatments. HOME CARE INSTRUCTIONS  Drink plenty of water. Water helps thin the mucus so your sinuses can drain more easily.  Use a humidifier.  Inhale steam 3-4 times a day (for example, sit in the bathroom with the shower running).  Apply a warm, moist washcloth to your  face 3-4 times a day, or as directed by your health care provider.  Use saline nasal sprays to help moisten and clean your sinuses.  Take medicines only as directed by your health care provider.  If you were prescribed either an antibiotic or antifungal medicine, finish it all  even if you start to feel better. SEEK IMMEDIATE MEDICAL CARE IF:  You have increasing pain or severe headaches.  You have nausea, vomiting, or drowsiness.  You have swelling around your face.  You have vision problems.  You have a stiff neck.  You have difficulty breathing.   This information is not intended to replace advice given to you by your health care provider. Make sure you discuss any questions you have with your health care provider.   Document Released: 02/26/2005 Document Revised: 03/19/2014 Document Reviewed: 03/13/2011 Elsevier Interactive Patient Education [employer].

## 2015-01-10 NOTE — Telephone Encounter (Signed)
Okay for EGD. Thank you for checking, tell her

## 2015-01-10 NOTE — Telephone Encounter (Signed)
Spoke with pt and she is aware.

## 2015-01-10 NOTE — Progress Notes (Signed)
   HPI  Patient presents today with concern for sinus infection.  She states that she's had about 4 days of headache, chest pain, sinus pressure and facial pain, cough, nasal congestion, and sore throat. He states that the cough and the sinus pain or predominant factors. She also has malaise and subjective fever  Is been using Allegra with minimal improvement  PMH: Smoking status noted ROS: Per HPI  Objective: BP 128/92 mmHg  Pulse 110  Temp(Src) 98.3 F (36.8 C) (Oral)  Ht 5\' 6"  (1.676 m)  Wt 238 lb (107.956 kg)  BMI 38.43 kg/m2 Gen: NAD, alert, cooperative with exam HEENT: NCAT, TMs normal bilaterally, some facial tenderness over maxillary sinuses, oropharynx clear CV: RRR, good S1/S2, no murmur Resp: CTABL, no wheezes, non-labored Ext: No edema, warm Neuro: Alert and oriented, No gross deficits  Assessment and plan:  # Acute maxillary sinusitis, viral URI Viral URI leading to maxillary sinusitis Treat the sinusitis with Augmentin Tessalon for cough Come back if not improved or worsens   Meds ordered this encounter  Medications  . amoxicillin-clavulanate (AUGMENTIN) 875-125 MG tablet    Sig: Take 1 tablet by mouth 2 (two) times daily.    Dispense:  20 tablet    Refill:  0  . benzonatate (TESSALON) 100 MG capsule    Sig: Take 1 capsule (100 mg total) by mouth 2 (two) times daily as needed for cough.    Dispense:  20 capsule    Refill:  Zihlman, MD [facility] 01/10/2015, 11:15 AM

## 2015-01-10 NOTE — Telephone Encounter (Signed)
Pt was seen by her PCP today for a sinus infection. Pt was placed on Augmentin 875mg  BID and given some nose spray. No fever at PCP office. Pt wanted to make sure she is still ok for EGD. Please advise.

## 2015-01-14 ENCOUNTER — Ambulatory Visit (AMBULATORY_SURGERY_CENTER): Payer: 59 | Admitting: Internal Medicine

## 2015-01-14 ENCOUNTER — Encounter: Payer: Self-pay | Admitting: Internal Medicine

## 2015-01-14 VITALS — BP 126/84 | HR 82 | Temp 98.9°F | Resp 20 | Ht 65.5 in | Wt 236.0 lb

## 2015-01-14 DIAGNOSIS — K253 Acute gastric ulcer without hemorrhage or perforation: Secondary | ICD-10-CM

## 2015-01-14 DIAGNOSIS — K259 Gastric ulcer, unspecified as acute or chronic, without hemorrhage or perforation: Secondary | ICD-10-CM | POA: Diagnosis not present

## 2015-01-14 DIAGNOSIS — K219 Gastro-esophageal reflux disease without esophagitis: Secondary | ICD-10-CM

## 2015-01-14 MED ORDER — SODIUM CHLORIDE 0.9 % IV SOLN: 500.0000 mL | INTRAVENOUS | Status: DC

## 2015-01-14 NOTE — Progress Notes (Signed)
Report to PACU, RN, vss, BBS= Clear.  

## 2015-01-14 NOTE — Op Note (Signed)
[place]  [address] [place], [address]   ENDOSCOPY PROCEDURE REPORT  PATIENT: [name], [name]  MR#: [id number] BIRTHDATE: 1970-04-10 , 43  yrs. old GENDER: female ENDOSCOPIST: Eustace Quail, MD REFERRED BY:  Breck Coons, N.P. PROCEDURE DATE:  01/14/2015 PROCEDURE:  EGD w/ biopsy ASA CLASS:     Class II INDICATIONS:  history of esophageal reflux and epigastric pain. MEDICATIONS: Monitored anesthesia care and Propofol 170 mg IV TOPICAL ANESTHETIC: none  DESCRIPTION OF PROCEDURE: After the risks benefits and alternatives of the procedure were thoroughly explained, informed consent was obtained.  The LB [id number] endoscope was introduced through the mouth and advanced to the second portion of the duodenum , Without limitations.  The instrument was slowly withdrawn as the mucosa was fully examined.   EXAM:Esophagus was normal.  Stomach revealed superficial ulceration in the antrum.  Biopsies taken.  As well, CLO biopsy taken.  The duodenum was normal.  Retroflexed views revealed no abnormalities. The scope was then withdrawn from the patient and the procedure completed.  COMPLICATIONS: There were no immediate complications.  ENDOSCOPIC IMPRESSION: 1. GERD 2. Official gastric ulceration likely secondary to NSAIDs  RECOMMENDATIONS: 1.  Anti-reflux regimen to be followed. Weight loss very important 2.  Continue pantoprazole 40 mg daily 3.  Rx CLO if positive 4. Return to the care of your PCP  REPEAT EXAM:  eSigned:  Eustace Quail, MD 01/14/2015 10:18 AM    CC:The Patient and Chevis Pretty, NP

## 2015-01-14 NOTE — Patient Instructions (Signed)

## 2015-01-14 NOTE — Progress Notes (Signed)
Called to room to assist during endoscopic procedure.  Patient ID and intended procedure confirmed with present staff. Received instructions for my participation in the procedure from the performing physician.  

## 2015-01-17 ENCOUNTER — Telehealth: Payer: Self-pay | Admitting: *Deleted

## 2015-01-17 ENCOUNTER — Encounter: Payer: Self-pay | Admitting: Internal Medicine

## 2015-01-17 LAB — HELICOBACTER PYLORI SCREEN-BIOPSY: UREASE: NEGATIVE

## 2015-01-17 NOTE — Telephone Encounter (Signed)
No answer, message left for the patient. 

## 2015-02-09 ENCOUNTER — Encounter: Payer: Self-pay | Admitting: Family Medicine

## 2015-02-15 ENCOUNTER — Ambulatory Visit (INDEPENDENT_AMBULATORY_CARE_PROVIDER_SITE_OTHER): Payer: 59 | Admitting: Family

## 2015-02-15 ENCOUNTER — Encounter: Payer: Self-pay | Admitting: Family

## 2015-02-15 VITALS — BP 126/91 | HR 103 | Temp 97.0°F | Ht 66.0 in | Wt 239.2 lb

## 2015-02-15 DIAGNOSIS — J029 Acute pharyngitis, unspecified: Secondary | ICD-10-CM | POA: Diagnosis not present

## 2015-02-15 DIAGNOSIS — N611 Abscess of the breast and nipple: Secondary | ICD-10-CM

## 2015-02-15 DIAGNOSIS — J309 Allergic rhinitis, unspecified: Secondary | ICD-10-CM

## 2015-02-15 DIAGNOSIS — L02419 Cutaneous abscess of limb, unspecified: Secondary | ICD-10-CM | POA: Diagnosis not present

## 2015-02-15 LAB — POCT RAPID STREP A (OFFICE): RAPID STREP A SCREEN: NEGATIVE

## 2015-02-15 MED ORDER — SULFAMETHOXAZOLE-TRIMETHOPRIM 800-160 MG PO TABS: 1.0000 | ORAL_TABLET | Freq: Two times a day (BID) | ORAL | Status: DC

## 2015-02-15 MED ORDER — MOMETASONE FUROATE 50 MCG/ACT NA SUSP: 2.0000 | Freq: Every day | NASAL | Status: DC

## 2015-02-15 NOTE — Progress Notes (Signed)
Subjective:    Patient ID: [name] [name], female    DOB: Jul 21, 1970, 44 y.o.   MRN: [id number]  PT presents to the office today for left ear fullness. PT states she "failed" her hearing test. PT states she was treated with Augmentin on 01/10/15 for a sinus infection.  Ear Fullness  There is pain in the left ear. This is a new problem. The current episode started 1 to 4 weeks ago. The problem occurs every few minutes. The problem has been unchanged. There has been no fever. The pain is at a severity of 2/10. The patient is experiencing no pain. Associated symptoms include headaches, hearing loss, a rash and a sore throat. Pertinent negatives include no coughing, diarrhea, ear discharge, neck pain, rhinorrhea or vomiting. She has tried antibiotics for the symptoms. The treatment provided mild relief.  Rash This is a new problem. The current episode started in the past 7 days. The problem has been waxing and waning since onset. Location: bilateral axiallary. The rash is characterized by itchiness, redness and pain. She was exposed to nothing. Associated symptoms include a sore throat. Pertinent negatives include no cough, diarrhea, rhinorrhea, shortness of breath or vomiting. Past treatments include anti-itch cream. The treatment provided mild relief.      Review of Systems  Constitutional: Negative.   HENT: Positive for hearing loss and sore throat. Negative for ear discharge and rhinorrhea.   Eyes: Negative.   Respiratory: Negative.  Negative for cough and shortness of breath.   Cardiovascular: Negative.  Negative for palpitations.  Gastrointestinal: Negative.  Negative for vomiting and diarrhea.  Endocrine: Positive for polyphagia.  Genitourinary: Negative.   Musculoskeletal: Negative.  Negative for neck pain.  Skin: Positive for rash.  Neurological: Positive for headaches.  Hematological: Negative.   Psychiatric/Behavioral: Negative.   All other systems reviewed and are negative.     Objective:   Physical Exam  Constitutional: [name] oriented to person, place, and time. She appears well-developed and well-nourished. No distress.  HENT:  Head: Normocephalic and atraumatic.  Right Ear: External ear normal.  Left Ear: External ear normal.  Mouth/Throat: Oropharyngeal exudate present.  Oropharynx erythemas  Eyes: Pupils are equal, round, and reactive to light.  Neck: Normal range of motion. Neck supple. No thyromegaly present.  Cardiovascular: Normal rate, regular rhythm, normal heart sounds and intact distal pulses.   No murmur heard. Pulmonary/Chest: Effort normal and breath sounds normal. No respiratory distress. She has no wheezes. Left breast exhibits skin change (circular erythemas abscess on left lower breast).  Abdominal: Soft. Bowel sounds are normal. She exhibits no distension. There is no tenderness.  Musculoskeletal: Normal range of motion. She exhibits no edema or tenderness.  Neurological: [name] alert and oriented to person, place, and time. She has normal reflexes. No cranial nerve deficit.  Skin: Skin is warm and dry.  Three hard abscess under right axillary, erythemas bases   Psychiatric: She has a normal mood and affect. Her behavior is normal. Judgment and thought content normal.  Vitals reviewed.    BP 126/91 mmHg  Pulse 103  Temp(Src) 97 F (36.1 C) (Oral)  Ht 5\' 6"  (1.676 m)  Wt 239 lb 3.2 oz (108.5 kg)  BMI 38.63 kg/m2     Assessment & Plan:  1. Axillary abscess -Keep clean and dry -Do not pick or squeeze  -Warm Compresses  - sulfamethoxazole-trimethoprim (BACTRIM DS) 800-160 MG tablet; Take 1 tablet by mouth 2 (two) times daily.  Dispense: 20 tablet; Refill: 0  2. Breast abscess -Keep clean and dry -Do not pick or squeeze  -Warm Compresses  -Pt's mammogram was this year and was WNL, if does not resolve with antibiotic will send for scan? - sulfamethoxazole-trimethoprim (BACTRIM DS) 800-160 MG tablet; Take 1 tablet by mouth 2  (two) times daily.  Dispense: 20 tablet; Refill: 0  3. Allergic rhinitis, unspecified allergic rhinitis type -Start nasonex today -Continue with Allegra daily - mometasone (NASONEX) 50 MCG/ACT nasal spray; Place 2 sprays into the nose daily.  Dispense: 17 g; Refill: Letcher, FNP

## 2015-02-15 NOTE — Addendum Note (Signed)
Addended by: Selmer Dominion on: 02/15/2015 01:14 PM   Modules accepted: Orders

## 2015-02-15 NOTE — Patient Instructions (Signed)

## 2015-02-23 ENCOUNTER — Other Ambulatory Visit: Payer: Self-pay | Admitting: *Deleted

## 2015-02-24 ENCOUNTER — Encounter: Payer: Self-pay | Admitting: Pulmonary Disease

## 2015-02-24 ENCOUNTER — Ambulatory Visit (INDEPENDENT_AMBULATORY_CARE_PROVIDER_SITE_OTHER): Payer: 59 | Admitting: Pulmonary Disease

## 2015-02-24 VITALS — BP 110/78 | HR 106 | Ht 66.0 in | Wt 242.0 lb

## 2015-02-24 DIAGNOSIS — R0683 Snoring: Secondary | ICD-10-CM

## 2015-02-24 MED ORDER — CYCLOBENZAPRINE HCL 10 MG PO TABS: 10.0000 mg | ORAL_TABLET | Freq: Three times a day (TID) | ORAL | Status: DC

## 2015-02-24 NOTE — Progress Notes (Deleted)
   Subjective:    Patient ID: [name] [name], female    DOB: 1970/12/30, 44 y.o.   MRN: [id number]  HPI    Review of Systems  Constitutional: Negative for fever and unexpected weight change.  HENT: Positive for ear pain, sneezing, sore throat and trouble swallowing. Negative for congestion, nosebleeds, postnasal drip, rhinorrhea and sinus pressure.   Eyes: Negative for redness and itching.  Respiratory: Positive for cough and shortness of breath. Negative for chest tightness and wheezing.   Cardiovascular: Positive for chest pain. Negative for palpitations and leg swelling.       Acid Reflux//Indigestion  Gastrointestinal: Positive for abdominal pain. Negative for nausea and vomiting.  Genitourinary: Negative for dysuria.  Musculoskeletal: Negative for joint swelling.  Skin: Negative for rash.  Neurological: Positive for headaches.  Hematological: Does not bruise/bleed easily.  Psychiatric/Behavioral: Negative for dysphoric mood. The patient is not nervous/anxious.        Objective:   Physical Exam        Assessment & Plan:

## 2015-02-24 NOTE — Patient Instructions (Signed)
Will arrange for home sleep study Will call to arrange for follow up after sleep study reviewed  

## 2015-02-24 NOTE — Progress Notes (Signed)
Past Medical History She  has a past medical history of Hypothyroidism; Anxiety; Palpitations; Chronic headaches; Colon polyps; Hyperlipidemia; Allergy; and GERD (gastroesophageal reflux disease).  Past Surgical History She  has past surgical history that includes Cesarean section; Colonoscopy; and Lithotripsy (12/2014).  Current Outpatient Prescriptions on File Prior to Visit  Medication Sig  . clonazePAM (KLONOPIN) 0.5 MG tablet TAKE 1 TABLET BY MOUTH TWICE DAILY  . cyclobenzaprine (FLEXERIL) 10 MG tablet Take 1 tablet (10 mg total) by mouth 3 (three) times daily.  . fexofenadine (ALLEGRA) 180 MG tablet Take 180 mg by mouth daily.  . hyoscyamine (LEVSIN SL) 0.125 MG SL tablet Place 1 tablet (0.125 mg total) under the tongue every 8 (eight) hours as needed for cramping.  Marland Kitchen ibuprofen (ADVIL,MOTRIN) 800 MG tablet TAKE 1 TABLET BY MOUTH EVERY SIX HOURS AS NEEDED  . levothyroxine (SYNTHROID, LEVOTHROID) 112 MCG tablet TAKE 1 TABLET BY MOUTH EVERY DAY BEFORE BREAKFAST  . medroxyPROGESTERone (DEPO-PROVERA) 150 MG/ML injection   . mometasone (NASONEX) 50 MCG/ACT nasal spray Place 2 sprays into the nose daily.  . pantoprazole (PROTONIX) 40 MG tablet Take 1 tablet (40 mg total) by mouth daily. 30 min prior to breakfast  . propranolol (INDERAL) 80 MG tablet TAKE 1 TABLET BY MOUTH EVERY DAY  . ranitidine (ZANTAC) 150 MG tablet Take 150 mg by mouth daily.  . rifaximin (XIFAXAN) 550 MG TABS tablet Take 1 tablet (550 mg total) by mouth 3 (three) times daily. For 14 days  . sulfamethoxazole-trimethoprim (BACTRIM DS) 800-160 MG tablet Take 1 tablet by mouth 2 (two) times daily.  . SUMAtriptan (IMITREX) 50 MG tablet 1 at headache onset may repeat in 2 hours X1  . VESICARE 5 MG tablet Take 5 mg by mouth daily.    No current facility-administered medications on file prior to visit.    No Known Allergies  Family History Her family history includes Breast cancer in her paternal grandmother; Heart disease  in her maternal grandfather and maternal grandmother; Hypertension in her mother; Lung cancer in her father.  Social History She  reports that she has been smoking Cigarettes.  She started smoking about 6 years ago. She has a 3.5 pack-year smoking history. She uses smokeless tobacco. She reports that she drinks alcohol. She reports that she does not use illicit drugs.  Review of systems Review of Systems  Constitutional: Negative for fever and unexpected weight change.  HENT: Positive for ear pain, sneezing, sore throat and trouble swallowing. Negative for congestion, nosebleeds, postnasal drip, rhinorrhea and sinus pressure.   Eyes: Negative for redness and itching.  Respiratory: Positive for cough and shortness of breath. Negative for chest tightness and wheezing.   Cardiovascular: Positive for chest pain. Negative for palpitations and leg swelling.       Acid Reflux//Indigestion  Gastrointestinal: Positive for abdominal pain. Negative for nausea and vomiting.  Genitourinary: Negative for dysuria.  Musculoskeletal: Negative for joint swelling.  Skin: Negative for rash.  Neurological: Positive for headaches.  Hematological: Does not bruise/bleed easily.  Psychiatric/Behavioral: Negative for dysphoric mood. The patient is not nervous/anxious.     Chief Complaint  Patient presents with  . Sleep Consult    Referred by Dr Hassell Done. Epworth Score: 24    Tests:  Vital signs BP 110/78 mmHg  Pulse 106  Ht 5\' 6"  (1.676 m)  Wt 242 lb (109.77 kg)  BMI 39.08 kg/m2  SpO2 92%  History of Present Illness [name] [name] is a 44 y.o. female for evaluation of sleep  problems.  She has been getting more concerned about her sleep.  She wakes up frequently.  She snores, and has been told she stops breathing.  She will talk in her sleep.  Her mouth gets dry.  She has trouble staying awake while driving sometimes.    She goes to sleep at 10 pm.  She falls asleep after few minutes.  She wakes up 1 or2  times to use the bathroom.  She gets out of bed at 5 am on weekdays, but sleeps until 9 am on weekends.  She was working swing shift, but changed to am shift in October 2016.  She feels tired in the morning, and it can be a struggle to get out of bed.  She denies morning headache.  She does not use anything to help her fall sleep or stay awake.  She denies sleep walking, bruxism, or nightmares.  There is no history of restless legs.  She denies sleep hallucinations, sleep paralysis, or cataplexy.  She occasionally gets leg cramps.  Her TMJ will crack sometimes when she chews.  The Epworth score is 24 out of 24.   Physical Exam:  General - No distress ENT - No sinus tenderness, no oral exudate, no LAN, no thyromegaly, TM clear, pupils equal/reactive, MP 4, scalloped tongue Cardiac - s1s2 regular, no murmur, pulses symmetric Chest - No wheeze/rales/dullness, good air entry, normal respiratory excursion Back - No focal tenderness Abd - Soft, non-tender, no organomegaly, + bowel sounds Ext - No edema Neuro - Normal strength, cranial nerves intact Skin - No rashes Psych - Normal mood, and behavior  Discussion: She has snoring, sleep disruption, witnessed apnea, and daytime sleepiness.  I am concerned she could have sleep apnea.  We discussed how sleep apnea can affect various health problems, including risks for hypertension, cardiovascular disease, and diabetes.  We also discussed how sleep disruption can increase risks for accidents, such as while driving.  Weight loss as a means of improving sleep apnea was also reviewed.  Additional treatment options discussed were CPAP therapy, oral appliance, and surgical intervention.   Assessment/plan:  Snoring. Plan: - will arrange for home sleep study to further assess for sleep apnea  Obesity. Plan: - reviewed options to assist with weight loss   Patient Instructions  Will arrange for home sleep study Will call to arrange for follow up  after sleep study reviewed      Chesley Mires, M.D. Pager [phone]

## 2015-03-01 ENCOUNTER — Ambulatory Visit: Payer: 59 | Admitting: Family

## 2015-03-02 ENCOUNTER — Encounter: Payer: Self-pay | Admitting: Nurse Practitioner

## 2015-03-02 DIAGNOSIS — G4733 Obstructive sleep apnea (adult) (pediatric): Secondary | ICD-10-CM

## 2015-03-09 ENCOUNTER — Telehealth: Payer: Self-pay | Admitting: Pulmonary Disease

## 2015-03-09 DIAGNOSIS — G4733 Obstructive sleep apnea (adult) (pediatric): Secondary | ICD-10-CM

## 2015-03-09 NOTE — Telephone Encounter (Signed)
HST 03/02/15 >> AHI 92.2, SaO2 low 47%.  Will have my nurse inform pt that sleep study shows very severe sleep apnea.  She needs to start auto CPAP with range 5 to 20 cm H2O, heated humidity and mask of choice.  Please schedule ROV in 2 months after set up with me or Tammy Parrett.

## 2015-03-10 ENCOUNTER — Other Ambulatory Visit: Payer: Self-pay | Admitting: *Deleted

## 2015-03-10 DIAGNOSIS — R0683 Snoring: Secondary | ICD-10-CM

## 2015-03-10 NOTE — Telephone Encounter (Signed)
ATC x 3, Line busy, wcb

## 2015-03-10 NOTE — Telephone Encounter (Signed)
Results have been explained to patient, pt expressed understanding. CPAP order placed. Recall entered for 2 months. Nothing further needed.

## 2015-03-11 ENCOUNTER — Telehealth: Payer: Self-pay | Admitting: Pulmonary Disease

## 2015-03-11 NOTE — Telephone Encounter (Signed)
Per 03/10/15: General 03/11/2015 3:00 PM WALKER, ANITA S        Note   Faxed referral order,ins card, office note, sleep study 15 pages to [facility]. Rec'd confirmation from fax that 15 pages were received. I called them to verify that they had received the records and they had.[place] spoke with pt. She reports she was told by [place] [facility] she needed to call her insurance to see if prior approval needs to be done. She called [employer] and was told she did not need a prior approval. I called [place] [facility] and was transferred to billing and had to [facility] I called pt to make her aware. She will also call them and let them know what she was told as well. Will await call back from [employer]

## 2015-03-15 NOTE — Telephone Encounter (Signed)
[name] [name], Has this been taken care of? Please advise.

## 2015-03-15 NOTE — Telephone Encounter (Signed)
According to [facility] with [facility] Mrs. Miramontes was setup on CPAP on 12/30.

## 2015-03-29 ENCOUNTER — Telehealth: Payer: Self-pay | Admitting: *Deleted

## 2015-03-29 NOTE — Telephone Encounter (Signed)
Patient called in stating that she was having chest pain that radiated to her back and up to her jaw line. Patient states she is at work in [place] and was advised to go straight to the ER.

## 2015-04-11 ENCOUNTER — Ambulatory Visit: Payer: 59 | Admitting: Family

## 2015-04-19 ENCOUNTER — Other Ambulatory Visit: Payer: Self-pay | Admitting: Nurse Practitioner

## 2015-04-19 NOTE — Telephone Encounter (Signed)
Detailed message left for patient that she needs to be seen.

## 2015-04-19 NOTE — Telephone Encounter (Signed)
Last TSH 03/2014

## 2015-04-19 NOTE — Telephone Encounter (Signed)
Last refill without being seen 

## 2015-04-21 ENCOUNTER — Telehealth: Payer: Self-pay | Admitting: Family

## 2015-04-21 NOTE — Telephone Encounter (Signed)
Has appt 04/22/15

## 2015-04-22 ENCOUNTER — Ambulatory Visit (INDEPENDENT_AMBULATORY_CARE_PROVIDER_SITE_OTHER): Payer: 59 | Admitting: Family

## 2015-04-22 ENCOUNTER — Encounter: Payer: Self-pay | Admitting: Family

## 2015-04-22 VITALS — BP 116/80 | HR 85 | Temp 98.8°F | Ht 66.0 in | Wt 250.8 lb

## 2015-04-22 DIAGNOSIS — K2100 Gastro-esophageal reflux disease with esophagitis, without bleeding: Secondary | ICD-10-CM

## 2015-04-22 DIAGNOSIS — Z09 Encounter for follow-up examination after completed treatment for conditions other than malignant neoplasm: Secondary | ICD-10-CM

## 2015-04-22 MED ORDER — CLONAZEPAM 0.5 MG PO TABS: 0.5000 mg | ORAL_TABLET | Freq: Two times a day (BID) | ORAL | Status: DC

## 2015-04-22 NOTE — Progress Notes (Signed)
   Subjective:    Patient ID: [name] [name], female    DOB: 09/28/1970, 45 y.o.   MRN: [id number]  HPI Pt presents to the office today for hospital follow up for chest pain. Pt was admitted on 03/29/15 and discharged on 03/30/15. Pt states her EKG, stress test, and blood work was negative and was told it was related to her GERD. PT has a GI and was told to continue her Protonix 40 mg in AM and was started on Reglan 10 mg at night and zantac 150 mg in the evening. Pt  States since starting this regimen and stopping caffeine, diet change, and not eating 3 hours before bed her epigastric pain has resolved.   *Hopital notes reviewed.   Review of Systems  Constitutional: Negative.   HENT: Negative.   Eyes: Negative.   Respiratory: Negative.  Negative for shortness of breath.   Cardiovascular: Negative.  Negative for palpitations.  Gastrointestinal: Negative.   Endocrine: Negative.   Genitourinary: Negative.   Musculoskeletal: Negative.   Neurological: Negative.  Negative for headaches.  Hematological: Negative.   Psychiatric/Behavioral: Negative.   All other systems reviewed and are negative.      Objective:   Physical Exam  Constitutional: [name] oriented to person, place, and time. She appears well-developed and well-nourished. No distress.  HENT:  Head: Normocephalic and atraumatic.  Right Ear: External ear normal.  Left Ear: External ear normal.  Nose: Nose normal.  Mouth/Throat: Oropharynx is clear and moist.  Eyes: Pupils are equal, round, and reactive to light.  Neck: Normal range of motion. Neck supple. No thyromegaly present.  Cardiovascular: Normal rate, regular rhythm, normal heart sounds and intact distal pulses.   No murmur heard. Pulmonary/Chest: Effort normal and breath sounds normal. No respiratory distress. She has no wheezes.  Abdominal: Soft. Bowel sounds are normal. She exhibits no distension. There is no tenderness.  Musculoskeletal: Normal range of motion. She  exhibits no edema or tenderness.  Neurological: [name] alert and oriented to person, place, and time. She has normal reflexes. No cranial nerve deficit.  Skin: Skin is warm and dry.  Psychiatric: She has a normal mood and affect. Her behavior is normal. Judgment and thought content normal.  Vitals reviewed.   BP 116/80 mmHg  Pulse 85  Temp(Src) 98.8 F (37.1 C) (Oral)  Ht 5\' 6"  (1.676 m)  Wt 250 lb 12.8 oz (113.762 kg)  BMI 40.50 kg/m2       Assessment & Plan:  1. Hospital discharge follow-up    2. Gastroesophageal reflux disease with esophagitis -Continue medications Keep all GI appts Pt requesting FMLA paperwork to be filled out because she missed more than 3 days at work. Continue to avoid spicy, fried foods, and caffeine Do not eat 3 hours before bed RTO prn  Evelina Dun, FNP

## 2015-04-22 NOTE — Patient Instructions (Signed)
Gastroesophageal Reflux Disease, Adult Normally, food travels down the esophagus and stays in the stomach to be digested. However, when a person has gastroesophageal reflux disease (GERD), food and stomach acid move back up into the esophagus. When this happens, the esophagus becomes sore and inflamed. Over time, GERD can create small holes (ulcers) in the lining of the esophagus.  CAUSES This condition is caused by a problem with the muscle between the esophagus and the stomach (lower esophageal sphincter, or LES). Normally, the LES muscle closes after food passes through the esophagus to the stomach. When the LES is weakened or abnormal, it does not close properly, and that allows food and stomach acid to go back up into the esophagus. The LES can be weakened by certain dietary substances, medicines, and medical conditions, including:  Tobacco use.  Pregnancy.  Having a hiatal hernia.  Heavy alcohol use.  Certain foods and beverages, such as coffee, chocolate, onions, and peppermint. RISK FACTORS This condition is more likely to develop in:  People who have an increased body weight.  People who have connective tissue disorders.  People who use NSAID medicines. SYMPTOMS Symptoms of this condition include:  Heartburn.  Difficult or painful swallowing.  The feeling of having a lump in the throat.  Abitter taste in the mouth.  Bad breath.  Having a large amount of saliva.  Having an upset or bloated stomach.  Belching.  Chest pain.  Shortness of breath or wheezing.  Ongoing (chronic) cough or a night-time cough.  Wearing away of tooth enamel.  Weight loss. Different conditions can cause chest pain. Make sure to see your health care provider if you experience chest pain. DIAGNOSIS Your health care provider will take a medical history and perform a physical exam. To determine if you have mild or severe GERD, your health care provider may also monitor how you respond  to treatment. You may also have other tests, including:  An endoscopy toexamine your stomach and esophagus with a small camera.  A test thatmeasures the acidity level in your esophagus.  A test thatmeasures how much pressure is on your esophagus.  A barium swallow or modified barium swallow to show the shape, size, and functioning of your esophagus. TREATMENT The goal of treatment is to help relieve your symptoms and to prevent complications. Treatment for this condition may vary depending on how severe your symptoms are. Your health care provider may recommend:  Changes to your diet.  Medicine.  Surgery. HOME CARE INSTRUCTIONS Diet  Follow a diet as recommended by your health care provider. This may involve avoiding foods and drinks such as:  Coffee and tea (with or without caffeine).  Drinks that containalcohol.  Energy drinks and sports drinks.  Carbonated drinks or sodas.  Chocolate and cocoa.  Peppermint and mint flavorings.  Garlic and onions.  Horseradish.  Spicy and acidic foods, including peppers, chili powder, curry powder, vinegar, hot sauces, and barbecue sauce.  Citrus fruit juices and citrus fruits, such as oranges, lemons, and limes.  Tomato-based foods, such as red sauce, chili, salsa, and pizza with red sauce.  Fried and fatty foods, such as donuts, french fries, potato chips, and high-fat dressings.  High-fat meats, such as hot dogs and fatty cuts of red and white meats, such as rib eye steak, sausage, ham, and bacon.  High-fat dairy items, such as whole milk, butter, and cream cheese.  Eat small, frequent meals instead of large meals.  Avoid drinking large amounts of liquid with your   meals.  Avoid eating meals during the 2-3 hours before bedtime.  Avoid lying down right after you eat.  Do not exercise right after you eat. General Instructions  Pay attention to any changes in your symptoms.  Take over-the-counter and prescription  medicines only as told by your health care provider. Do not take aspirin, ibuprofen, or other NSAIDs unless your health care provider told you to do so.  Do not use any tobacco products, including cigarettes, chewing tobacco, and e-cigarettes. If you need help quitting, ask your health care provider.  Wear loose-fitting clothing. Do not wear anything tight around your waist that causes pressure on your abdomen.  Raise (elevate) the head of your bed 6 inches (15cm).  Try to reduce your stress, such as with yoga or meditation. If you need help reducing stress, ask your health care provider.  If you are overweight, reduce your weight to an amount that is healthy for you. Ask your health care provider for guidance about a safe weight loss goal.  Keep all follow-up visits as told by your health care provider. This is important. SEEK MEDICAL CARE IF:  You have new symptoms.  You have unexplained weight loss.  You have difficulty swallowing, or it hurts to swallow.  You have wheezing or a persistent cough.  Your symptoms do not improve with treatment.  You have a hoarse voice. SEEK IMMEDIATE MEDICAL CARE IF:  You have pain in your arms, neck, jaw, teeth, or back.  You feel sweaty, dizzy, or light-headed.  You have chest pain or shortness of breath.  You vomit and your vomit looks like blood or coffee grounds.  You faint.  Your stool is bloody or black.  You cannot swallow, drink, or eat.   This information is not intended to replace advice given to you by your health care provider. Make sure you discuss any questions you have with your health care provider.   Document Released: 12/06/2004 Document Revised: 11/17/2014 Document Reviewed: 06/23/2014 Elsevier Interactive Patient Education 2016 Elsevier Inc. Food Choices for Gastroesophageal Reflux Disease, Adult When you have gastroesophageal reflux disease (GERD), the foods you eat and your eating habits are very important.  Choosing the right foods can help ease the discomfort of GERD. WHAT GENERAL GUIDELINES DO I NEED TO FOLLOW?  Choose fruits, vegetables, whole grains, low-fat dairy products, and low-fat meat, fish, and poultry.  Limit fats such as oils, salad dressings, butter, nuts, and avocado.  Keep a food diary to identify foods that cause symptoms.  Avoid foods that cause reflux. These may be different for different people.  Eat frequent small meals instead of three large meals each day.  Eat your meals slowly, in a relaxed setting.  Limit fried foods.  Cook foods using methods other than frying.  Avoid drinking alcohol.  Avoid drinking large amounts of liquids with your meals.  Avoid bending over or lying down until 2-3 hours after eating. WHAT FOODS ARE NOT RECOMMENDED? The following are some foods and drinks that may worsen your symptoms: Vegetables Tomatoes. Tomato juice. Tomato and spaghetti sauce. Chili peppers. Onion and garlic. Horseradish. Fruits Oranges, grapefruit, and lemon (fruit and juice). Meats High-fat meats, fish, and poultry. This includes hot dogs, ribs, ham, sausage, salami, and bacon. Dairy Whole milk and chocolate milk. Sour cream. Cream. Butter. Ice cream. Cream cheese.  Beverages Coffee and tea, with or without caffeine. Carbonated beverages or energy drinks. Condiments Hot sauce. Barbecue sauce.  Sweets/Desserts Chocolate and cocoa. Donuts. Peppermint and spearmint.   Fats and Oils High-fat foods, including French fries and potato chips. Other Vinegar. Strong spices, such as black pepper, white pepper, red pepper, cayenne, curry powder, cloves, ginger, and chili powder. The items listed above may not be a complete list of foods and beverages to avoid. Contact your dietitian for more information.   This information is not intended to replace advice given to you by your health care provider. Make sure you discuss any questions you have with your health care  provider.   Document Released: 02/26/2005 Document Revised: 03/19/2014 Document Reviewed: 12/31/2012 Elsevier Interactive Patient Education 2016 Elsevier Inc.  

## 2015-04-28 ENCOUNTER — Ambulatory Visit (INDEPENDENT_AMBULATORY_CARE_PROVIDER_SITE_OTHER): Payer: 59 | Admitting: Family

## 2015-04-28 ENCOUNTER — Encounter: Payer: Self-pay | Admitting: Family

## 2015-04-28 VITALS — BP 112/81 | HR 100 | Temp 98.0°F | Ht 66.0 in | Wt 245.0 lb

## 2015-04-28 DIAGNOSIS — J0100 Acute maxillary sinusitis, unspecified: Secondary | ICD-10-CM

## 2015-04-28 DIAGNOSIS — J029 Acute pharyngitis, unspecified: Secondary | ICD-10-CM

## 2015-04-28 LAB — POCT INFLUENZA A/B
INFLUENZA B, POC: NEGATIVE
Influenza A, POC: NEGATIVE

## 2015-04-28 LAB — POCT RAPID STREP A (OFFICE): Rapid Strep A Screen: NEGATIVE

## 2015-04-28 MED ORDER — LEVOFLOXACIN 500 MG PO TABS: 500.0000 mg | ORAL_TABLET | Freq: Every day | ORAL | Status: DC

## 2015-04-28 MED ORDER — FLUTICASONE PROPIONATE 50 MCG/ACT NA SUSP
2.0000 | Freq: Every day | NASAL | Status: DC
Start: 2015-04-28 — End: 2015-06-02

## 2015-04-28 NOTE — Progress Notes (Signed)
Subjective:    Patient ID: [name] [name], female    DOB: [birth year], 45 y.o.   MRN: [id number]  Sore Throat  This is a new problem. The current episode started in the past 7 days. The problem has been gradually worsening. The pain is at a severity of 6/10. The pain is moderate. Associated symptoms include congestion, ear pain, headaches, a hoarse voice, a plugged ear sensation and trouble swallowing. Pertinent negatives include no coughing, ear discharge, shortness of breath or vomiting. She has had no exposure to strep. She has tried acetaminophen and gargles for the symptoms. The treatment provided mild relief.  Ear Fullness  Associated symptoms include headaches. Pertinent negatives include no coughing, ear discharge or vomiting.  Headache  Associated symptoms include ear pain. Pertinent negatives include no coughing or vomiting.  Sinus Problem Associated symptoms include congestion, ear pain, headaches and a hoarse voice. Pertinent negatives include no coughing or shortness of breath.      Review of Systems  Constitutional: Negative.   HENT: Positive for congestion, ear pain, hoarse voice and trouble swallowing. Negative for ear discharge.   Eyes: Negative.   Respiratory: Negative.  Negative for cough and shortness of breath.   Cardiovascular: Negative.  Negative for palpitations.  Gastrointestinal: Negative.  Negative for vomiting.  Endocrine: Negative.   Genitourinary: Negative.   Musculoskeletal: Negative.   Neurological: Positive for headaches.  Hematological: Negative.   Psychiatric/Behavioral: Negative.   All other systems reviewed and are negative.      Objective:   Physical Exam  Constitutional: [name] oriented to person, place, and time. She appears well-developed and well-nourished. No distress.  HENT:  Head: Normocephalic and atraumatic.  Right Ear: External ear normal.  Left Ear: External ear normal.  Nose: Right sinus exhibits maxillary sinus tenderness and  frontal sinus tenderness. Left sinus exhibits maxillary sinus tenderness and frontal sinus tenderness.  Mouth/Throat: Oropharyngeal exudate present.  Nasal passage erythemas with mild swelling    Eyes: Pupils are equal, round, and reactive to light.  Neck: Normal range of motion. Neck supple. No thyromegaly present.  Cardiovascular: Normal rate, regular rhythm, normal heart sounds and intact distal pulses.   No murmur heard. Pulmonary/Chest: Effort normal and breath sounds normal. No respiratory distress. She has no wheezes.  Abdominal: Soft. Bowel sounds are normal. She exhibits no distension. There is no tenderness.  Musculoskeletal: Normal range of motion. She exhibits no edema or tenderness.  Neurological: [name] alert and oriented to person, place, and time. She has normal reflexes. No cranial nerve deficit.  Skin: Skin is warm and dry.  Psychiatric: She has a normal mood and affect. Her behavior is normal. Judgment and thought content normal.  Vitals reviewed.   Results for orders placed or performed in visit on 04/28/15  POCT Influenza A/B  Result Value Ref Range   Influenza A, POC Negative Negative   Influenza B, POC Negative Negative  POCT rapid strep A  Result Value Ref Range   Rapid Strep A Screen Negative Negative     BP 112/81 mmHg  Pulse 100  Temp(Src) 98 F (36.7 C) (Oral)  Ht 5\' 6"  (1.676 m)  Wt 245 lb (111.131 kg)  BMI 39.56 kg/m2     Assessment & Plan:  1. Sore throat - POCT Influenza A/B - POCT rapid strep A  2. Acute maxillary sinusitis, recurrence not specified -- Take meds as prescribed - Use a cool mist humidifier  -Use saline nose sprays frequently -Saline irrigations of the  nose can be very helpful if done frequently.  * 4X daily for 1 week*  * Use of a nettie pot can be helpful with this. Follow directions with this* -Force fluids -For any cough or congestion  Use plain Mucinex- regular strength or max strength is fine   * Children-  consult with Pharmacist for dosing -For fever or aces or pains- take tylenol or ibuprofen appropriate for age and weight.  * for fevers greater than 101 orally you may alternate ibuprofen and tylenol every  3 hours. -Throat lozenges if help -New toothbrush in 3 days -RTO prn - levofloxacin (LEVAQUIN) 500 MG tablet; Take 1 tablet (500 mg total) by mouth daily.  Dispense: 7 tablet; Refill: 0 - fluticasone (FLONASE) 50 MCG/ACT nasal spray; Place 2 sprays into both nostrils daily.  Dispense: 16 g; Refill: [place], FNP

## 2015-04-28 NOTE — Patient Instructions (Signed)
Sinusitis, Adult Sinusitis is redness, soreness, and inflammation of the paranasal sinuses. Paranasal sinuses are air pockets within the bones of your face. They are located beneath your eyes, in the middle of your forehead, and above your eyes. In healthy paranasal sinuses, mucus is able to drain out, and air is able to circulate through them by way of your nose. However, when your paranasal sinuses are inflamed, mucus and air can become trapped. This can allow bacteria and other germs to grow and cause infection. Sinusitis can develop quickly and last only a short time (acute) or continue over a long period (chronic). Sinusitis that lasts for more than 12 weeks is considered chronic. CAUSES Causes of sinusitis include:  Allergies.  Structural abnormalities, such as displacement of the cartilage that separates your nostrils (deviated septum), which can decrease the air flow through your nose and sinuses and affect sinus drainage.  Functional abnormalities, such as when the small hairs (cilia) that line your sinuses and help remove mucus do not work properly or are not present. SIGNS AND SYMPTOMS Symptoms of acute and chronic sinusitis are the same. The primary symptoms are pain and pressure around the affected sinuses. Other symptoms include:  Upper toothache.  Earache.  Headache.  Bad breath.  Decreased sense of smell and taste.  A cough, which worsens when you are lying flat.  Fatigue.  Fever.  Thick drainage from your nose, which often is green and may contain pus (purulent).  Swelling and warmth over the affected sinuses. DIAGNOSIS Your health care provider will perform a physical exam. During your exam, your health care provider may perform any of the following to help determine if you have acute sinusitis or chronic sinusitis:  Look in your nose for signs of abnormal growths in your nostrils (nasal polyps).  Tap over the affected sinus to check for signs of  infection.  View the inside of your sinuses using an imaging device that has a light attached (endoscope). If your health care provider suspects that you have chronic sinusitis, one or more of the following tests may be recommended:  Allergy tests.  Nasal culture. A sample of mucus is taken from your nose, sent to a lab, and screened for bacteria.  Nasal cytology. A sample of mucus is taken from your nose and examined by your health care provider to determine if your sinusitis is related to an allergy. TREATMENT Most cases of acute sinusitis are related to a viral infection and will resolve on their own within 10 days. Sometimes, medicines are prescribed to help relieve symptoms of both acute and chronic sinusitis. These may include pain medicines, decongestants, nasal steroid sprays, or saline sprays. However, for sinusitis related to a bacterial infection, your health care provider will prescribe antibiotic medicines. These are medicines that will help kill the bacteria causing the infection. Rarely, sinusitis is caused by a fungal infection. In these cases, your health care provider will prescribe antifungal medicine. For some cases of chronic sinusitis, surgery is needed. Generally, these are cases in which sinusitis recurs more than 3 times per year, despite other treatments. HOME CARE INSTRUCTIONS  Drink plenty of water. Water helps thin the mucus so your sinuses can drain more easily.  Use a humidifier.  Inhale steam 3-4 times a day (for example, sit in the bathroom with the shower running).  Apply a warm, moist washcloth to your face 3-4 times a day, or as directed by your health care provider.  Use saline nasal sprays to help   moisten and clean your sinuses.  Take medicines only as directed by your health care provider.  If you were prescribed either an antibiotic or antifungal medicine, finish it all even if you start to feel better. SEEK IMMEDIATE MEDICAL CARE IF:  You have  increasing pain or severe headaches.  You have nausea, vomiting, or drowsiness.  You have swelling around your face.  You have vision problems.  You have a stiff neck.  You have difficulty breathing.   This information is not intended to replace advice given to you by your health care provider. Make sure you discuss any questions you have with your health care provider.   Document Released: 02/26/2005 Document Revised: 03/19/2014 Document Reviewed: 03/13/2011 Elsevier Interactive Patient Education 2016 [employer]  - Take meds as prescribed - Use a cool mist humidifier  -Use saline nose sprays frequently -Saline irrigations of the nose can be very helpful if done frequently.  * 4X daily for 1 week*  * Use of a nettie pot can be helpful with this. Follow directions with this* -Force fluids -For any cough or congestion  Use plain Mucinex- regular strength or max strength is fine   * Children- consult with Pharmacist for dosing -For fever or aces or pains- take tylenol or ibuprofen appropriate for age and weight.  * for fevers greater than 101 orally you may alternate ibuprofen and tylenol every  3 hours. -Throat lozenges if help -New toothbrush in 3 days   Christy Hawks, FNP   

## 2015-05-09 ENCOUNTER — Ambulatory Visit (INDEPENDENT_AMBULATORY_CARE_PROVIDER_SITE_OTHER): Payer: 59 | Admitting: Adult Health

## 2015-05-09 ENCOUNTER — Encounter: Payer: Self-pay | Admitting: Adult Health

## 2015-05-09 VITALS — BP 130/80 | HR 95 | Temp 98.1°F | Ht 66.0 in | Wt 250.0 lb

## 2015-05-09 DIAGNOSIS — G4733 Obstructive sleep apnea (adult) (pediatric): Secondary | ICD-10-CM | POA: Diagnosis not present

## 2015-05-09 NOTE — Progress Notes (Signed)
Subjective:    Patient ID: [name] [name], female    DOB: 27-Apr-1970, 45 y.o.   MRN: [id number]  HPI 45 yo female with very severe OSA   TEST  [facility] 03/02/15 >> AHI 92.2, SaO2 low 47%.  05/09/2015 Follow up ; OSA Pt returns for 2 month follow up for Sleep apnea.  Recent evaluation with HST showing severe OSA with AHI 92.  She was started on CPAP . Says she loves it, cant live without it.  Feels so much better.  Uses CPAP on average 6-8 hours nightly, mask fits fine. Pt c/o leak in the tubes. Going by DME to have it checked today.  Feels rested.  Download shows excellent compliance , w/ avg usage at 7 hr . AHI 0.5, min leaks. On auto set     Past Medical History  Diagnosis Date  . Hypothyroidism   . Anxiety   . Palpitations   . Chronic headaches   . Colon polyps   . Hyperlipidemia   . Allergy     SEASONAL  . GERD (gastroesophageal reflux disease)    Current Outpatient Prescriptions on File Prior to Visit  Medication Sig Dispense Refill  . clonazePAM (KLONOPIN) 0.5 MG tablet Take 1 tablet (0.5 mg total) by mouth 2 (two) times daily. 60 tablet 1  . cyclobenzaprine (FLEXERIL) 10 MG tablet Take 1 tablet (10 mg total) by mouth 3 (three) times daily. (Patient taking differently: Take 10 mg by mouth at bedtime. ) 30 tablet 2  . fexofenadine (ALLEGRA) 180 MG tablet Take 180 mg by mouth daily.    . fluticasone (FLONASE) 50 MCG/ACT nasal spray Place 2 sprays into both nostrils daily. 16 g 6  . ibuprofen (ADVIL,MOTRIN) 800 MG tablet TAKE 1 TABLET BY MOUTH EVERY SIX HOURS AS NEEDED 90 tablet 0  . levofloxacin (LEVAQUIN) 500 MG tablet Take 1 tablet (500 mg total) by mouth daily. 7 tablet 0  . levothyroxine (SYNTHROID, LEVOTHROID) 112 MCG tablet TAKE 1 TABLET BY MOUTH EVERY DAY BEFORE BREAKFAST 30 tablet 0  . medroxyPROGESTERone (DEPO-PROVERA) 150 MG/ML injection     . metoCLOPramide (REGLAN) 10 MG tablet Take 10 mg by mouth daily.     Marland Kitchen oxybutynin (DITROPAN-XL) 5 MG 24 hr tablet     .  pantoprazole (PROTONIX) 40 MG tablet Take 1 tablet (40 mg total) by mouth daily. 30 min prior to breakfast 30 tablet 4  . propranolol (INDERAL) 80 MG tablet TAKE 1 TABLET BY MOUTH EVERY DAY 30 tablet 5  . ranitidine (ZANTAC) 150 MG tablet Take 150 mg by mouth daily.    . rifaximin (XIFAXAN) 550 MG TABS tablet Take 1 tablet (550 mg total) by mouth 3 (three) times daily. For 14 days 42 tablet 0  . hyoscyamine (LEVSIN SL) 0.125 MG SL tablet Place 1 tablet (0.125 mg total) under the tongue every 8 (eight) hours as needed for cramping. (Patient not taking: Reported on 05/09/2015) 90 tablet 2  . mometasone (NASONEX) 50 MCG/ACT nasal spray Place 2 sprays into the nose daily. (Patient not taking: Reported on 05/09/2015) 17 g 12   No current facility-administered medications on file prior to visit.     Review of Systems Constitutional:   No  weight loss, night sweats,  Fevers, chills, fatigue, or  lassitude.  HEENT:   No headaches,  Difficulty swallowing,  Tooth/dental problems, or  Sore throat,                No sneezing, itching, ear ache,  nasal congestion, post nasal drip,   CV:  No chest pain,  Orthopnea, PND, swelling in lower extremities, anasarca, dizziness, palpitations, syncope.   GI  No heartburn, indigestion, abdominal pain, nausea, vomiting, diarrhea, change in bowel habits, loss of appetite, bloody stools.   Resp: No shortness of breath with exertion or at rest.  No excess mucus, no productive cough,  No non-productive cough,  No coughing up of blood.  No change in color of mucus.  No wheezing.  No chest wall deformity  Skin: no rash or lesions.  GU: no dysuria, change in color of urine, no urgency or frequency.  No flank pain, no hematuria   MS:  No joint pain or swelling.  No decreased range of motion.  No back pain.  Psych:  No change in mood or affect. No depression or anxiety.  No memory loss.          Objective:   Physical Exam  Filed Vitals:   05/09/15 1609  BP:  130/80  Pulse: 95  Temp: 98.1 F (36.7 C)  TempSrc: Oral  Height: 5\' 6"  (1.676 m)  Weight: 250 lb (113.399 kg)  SpO2: 97%  Body mass index is 40.37 kg/(m^2).  GEN: A/Ox3; pleasant , NAD, obese   HEENT:  [place]/AT,  EACs-clear, TMs-wnl, NOSE-clear, THROAT-clear, no lesions, no postnasal drip or exudate noted. Class 2-3 MP Airway   NECK:  Supple w/ fair ROM; no JVD; normal carotid impulses w/o bruits; no thyromegaly or nodules palpated; no lymphadenopathy.  RESP  Clear  P & A; w/o, wheezes/ rales/ or rhonchi.no accessory muscle use, no dullness to percussion  CARD:  RRR, no m/r/g  , no peripheral edema, pulses intact, no cyanosis or clubbing.  GI:   Soft & nt; nml bowel sounds; no organomegaly or masses detected.  Musco: Warm bil, no deformities or joint swelling noted.   Neuro: alert, no focal deficits noted.    Skin: Warm, no lesions or rashes  Tammy Parrett NP-C  [facility]  05/13/2015         Assessment & Plan:

## 2015-05-09 NOTE — Patient Instructions (Signed)
Continue on CPAP At bedtime  .  Keep up great work.  Work on weight loss.  Do not drive if sleepy.  Follow up with Dr. Halford Chessman  In 4-6 months and As needed   Work on not smoking .

## 2015-05-10 ENCOUNTER — Telehealth: Payer: Self-pay | Admitting: Family

## 2015-05-13 DIAGNOSIS — G4733 Obstructive sleep apnea (adult) (pediatric): Secondary | ICD-10-CM | POA: Insufficient documentation

## 2015-05-13 NOTE — Assessment & Plan Note (Signed)
Excellent control on CPAP   Plan Continue on CPAP At bedtime  .  Keep up great work.  Work on weight loss.  Do not drive if sleepy.  Follow up with Dr. Halford Chessman  In 4-6 months and As needed   Work on not smoking .

## 2015-05-13 NOTE — Assessment & Plan Note (Signed)
Wt loss discussed 

## 2015-05-19 ENCOUNTER — Other Ambulatory Visit: Payer: Self-pay | Admitting: Nurse Practitioner

## 2015-05-19 NOTE — Telephone Encounter (Signed)
Last refill without being seen 

## 2015-05-19 NOTE — Telephone Encounter (Signed)
Left detailed mssg on VM per ROI that refill has been sent but pt NTBS before next refill

## 2015-05-19 NOTE — Telephone Encounter (Signed)
Last TSH 03/2014

## 2015-05-23 ENCOUNTER — Other Ambulatory Visit: Payer: Self-pay | Admitting: Family Medicine

## 2015-05-26 ENCOUNTER — Telehealth: Payer: Self-pay | Admitting: Nurse Practitioner

## 2015-05-26 NOTE — Telephone Encounter (Signed)
i do not know what is going on because I have not sen her lately.

## 2015-05-26 NOTE — Telephone Encounter (Signed)
No, patient can just keep chronic follow up appts

## 2015-05-26 NOTE — Telephone Encounter (Signed)
Pt aware.

## 2015-06-02 ENCOUNTER — Encounter: Payer: Self-pay | Admitting: Nurse Practitioner

## 2015-06-02 ENCOUNTER — Ambulatory Visit (INDEPENDENT_AMBULATORY_CARE_PROVIDER_SITE_OTHER): Payer: 59 | Admitting: Nurse Practitioner

## 2015-06-02 VITALS — BP 110/73 | HR 95 | Temp 97.7°F | Ht 66.0 in | Wt 248.0 lb

## 2015-06-02 DIAGNOSIS — G4733 Obstructive sleep apnea (adult) (pediatric): Secondary | ICD-10-CM | POA: Diagnosis not present

## 2015-06-02 DIAGNOSIS — E039 Hypothyroidism, unspecified: Secondary | ICD-10-CM | POA: Diagnosis not present

## 2015-06-02 DIAGNOSIS — L309 Dermatitis, unspecified: Secondary | ICD-10-CM

## 2015-06-02 DIAGNOSIS — K219 Gastro-esophageal reflux disease without esophagitis: Secondary | ICD-10-CM | POA: Diagnosis not present

## 2015-06-02 DIAGNOSIS — R002 Palpitations: Secondary | ICD-10-CM

## 2015-06-02 DIAGNOSIS — F410 Panic disorder [episodic paroxysmal anxiety] without agoraphobia: Secondary | ICD-10-CM | POA: Diagnosis not present

## 2015-06-02 DIAGNOSIS — G43901 Migraine, unspecified, not intractable, with status migrainosus: Secondary | ICD-10-CM | POA: Diagnosis not present

## 2015-06-02 MED ORDER — METOCLOPRAMIDE HCL 10 MG PO TABS: 10.0000 mg | ORAL_TABLET | Freq: Every day | ORAL | Status: DC

## 2015-06-02 MED ORDER — RANITIDINE HCL 150 MG PO TABS
150.0000 mg | ORAL_TABLET | Freq: Every day | ORAL | Status: DC
Start: 2015-06-02 — End: 2016-05-25

## 2015-06-02 MED ORDER — CLONAZEPAM 0.5 MG PO TABS: 0.5000 mg | ORAL_TABLET | Freq: Two times a day (BID) | ORAL | Status: DC

## 2015-06-02 MED ORDER — LEVOTHYROXINE SODIUM 112 MCG PO TABS
ORAL_TABLET | ORAL | Status: DC
Start: 2015-06-02 — End: 2015-12-14

## 2015-06-02 MED ORDER — PROPRANOLOL HCL 80 MG PO TABS: 80.0000 mg | ORAL_TABLET | Freq: Every day | ORAL | Status: DC

## 2015-06-02 MED ORDER — PANTOPRAZOLE SODIUM 40 MG PO TBEC: 40.0000 mg | DELAYED_RELEASE_TABLET | Freq: Every day | ORAL | Status: DC

## 2015-06-02 NOTE — Patient Instructions (Signed)
Health Maintenance, Female Adopting a healthy lifestyle and getting preventive care can go a long way to promote health and wellness. Talk with your health care provider about what schedule of regular examinations is right for you. This is a good chance for you to check in with your provider about disease prevention and staying healthy. In between checkups, there are plenty of things you can do on your own. Experts have done a lot of research about which lifestyle changes and preventive measures are most likely to keep you healthy. Ask your health care provider for more information. WEIGHT AND DIET  Eat a healthy diet  Be sure to include plenty of vegetables, fruits, low-fat dairy products, and lean protein.  Do not eat a lot of foods high in solid fats, added sugars, or salt.  Get regular exercise. This is one of the most important things you can do for your health.  Most adults should exercise for at least 150 minutes each week. The exercise should increase your heart rate and make you sweat (moderate-intensity exercise).  Most adults should also do strengthening exercises at least twice a week. This is in addition to the moderate-intensity exercise.  Maintain a healthy weight  Body mass index (BMI) is a measurement that can be used to identify possible weight problems. It estimates body fat based on height and weight. Your health care provider can help determine your BMI and help you achieve or maintain a healthy weight.  For females 20 years of age and older:   A BMI below 18.5 is considered underweight.  A BMI of 18.5 to 24.9 is normal.  A BMI of 25 to 29.9 is considered overweight.  A BMI of 30 and above is considered obese.  Watch levels of cholesterol and blood lipids  You should start having your blood tested for lipids and cholesterol at 44 years of age, then have this test every 5 years.  You may need to have your cholesterol levels checked more often if:  Your lipid  or cholesterol levels are high.  You are older than 44 years of age.  You are at high risk for heart disease.  CANCER SCREENING   Lung Cancer  Lung cancer screening is recommended for adults 55-80 years old who are at high risk for lung cancer because of a history of smoking.  A yearly low-dose CT scan of the lungs is recommended for people who:  Currently smoke.  Have quit within the past 15 years.  Have at least a 30-pack-year history of smoking. A pack year is smoking an average of one pack of cigarettes a day for 1 year.  Yearly screening should continue until it has been 15 years since you quit.  Yearly screening should stop if you develop a health problem that would prevent you from having lung cancer treatment.  Breast Cancer  Practice breast self-awareness. This means understanding how your breasts normally appear and feel.  It also means doing regular breast self-exams. Let your health care provider know about any changes, no matter how small.  If you are in your 20s or 30s, you should have a clinical breast exam (CBE) by a health care provider every 1-3 years as part of a regular health exam.  If you are 40 or older, have a CBE every year. Also consider having a breast X-ray (mammogram) every year.  If you have a family history of breast cancer, talk to your health care provider about genetic screening.  If you   are at high risk for breast cancer, talk to your health care provider about having an MRI and a mammogram every year.  Breast cancer gene (BRCA) assessment is recommended for women who have family members with BRCA-related cancers. BRCA-related cancers include:  Breast.  Ovarian.  Tubal.  Peritoneal cancers.  Results of the assessment will determine the need for genetic counseling and BRCA1 and BRCA2 testing. Cervical Cancer Your health care provider may recommend that you be screened regularly for cancer of the pelvic organs (ovaries, uterus, and  vagina). This screening involves a pelvic examination, including checking for microscopic changes to the surface of your cervix (Pap test). You may be encouraged to have this screening done every 3 years, beginning at age 21.  For women ages 30-65, health care providers may recommend pelvic exams and Pap testing every 3 years, or they may recommend the Pap and pelvic exam, combined with testing for human papilloma virus (HPV), every 5 years. Some types of HPV increase your risk of cervical cancer. Testing for HPV may also be done on women of any age with unclear Pap test results.  Other health care providers may not recommend any screening for nonpregnant women who are considered low risk for pelvic cancer and who do not have symptoms. Ask your health care provider if a screening pelvic exam is right for you.  If you have had past treatment for cervical cancer or a condition that could lead to cancer, you need Pap tests and screening for cancer for at least 20 years after your treatment. If Pap tests have been discontinued, your risk factors (such as having a new sexual partner) need to be reassessed to determine if screening should resume. Some women have medical problems that increase the chance of getting cervical cancer. In these cases, your health care provider may recommend more frequent screening and Pap tests. Colorectal Cancer  This type of cancer can be detected and often prevented.  Routine colorectal cancer screening usually begins at 44 years of age and continues through 44 years of age.  Your health care provider may recommend screening at an earlier age if you have risk factors for colon cancer.  Your health care provider may also recommend using home test kits to check for hidden blood in the stool.  A small camera at the end of a tube can be used to examine your colon directly (sigmoidoscopy or colonoscopy). This is done to check for the earliest forms of colorectal  cancer.  Routine screening usually begins at age 50.  Direct examination of the colon should be repeated every 5-10 years through 44 years of age. However, you may need to be screened more often if early forms of precancerous polyps or small growths are found. Skin Cancer  Check your skin from head to toe regularly.  Tell your health care provider about any new moles or changes in moles, especially if there is a change in a mole's shape or color.  Also tell your health care provider if you have a mole that is larger than the size of a pencil eraser.  Always use sunscreen. Apply sunscreen liberally and repeatedly throughout the day.  Protect yourself by wearing long sleeves, pants, a wide-brimmed hat, and sunglasses whenever you are outside. HEART DISEASE, DIABETES, AND HIGH BLOOD PRESSURE   High blood pressure causes heart disease and increases the risk of stroke. High blood pressure is more likely to develop in:  People who have blood pressure in the high end   of the normal range (130-139/85-89 mm Hg).  People who are overweight or obese.  People who are African American.  If you are 38-23 years of age, have your blood pressure checked every 3-5 years. If you are 61 years of age or older, have your blood pressure checked every year. You should have your blood pressure measured twice--once when you are at a hospital or clinic, and once when you are not at a hospital or clinic. Record the average of the two measurements. To check your blood pressure when you are not at a hospital or clinic, you can use:  An automated blood pressure machine at a pharmacy.  A home blood pressure monitor.  If you are between 45 years and 39 years old, ask your health care provider if you should take aspirin to prevent strokes.  Have regular diabetes screenings. This involves taking a blood sample to check your fasting blood sugar level.  If you are at a normal weight and have a low risk for diabetes,  have this test once every three years after 44 years of age.  If you are overweight and have a high risk for diabetes, consider being tested at a younger age or more often. PREVENTING INFECTION  Hepatitis B  If you have a higher risk for hepatitis B, you should be screened for this virus. You are considered at high risk for hepatitis B if:  You were born in a country where hepatitis B is common. Ask your health care provider which countries are considered high risk.  Your parents were born in a high-risk country, and you have not been immunized against hepatitis B (hepatitis B vaccine).  You have HIV or AIDS.  You use needles to inject street drugs.  You live with someone who has hepatitis B.  You have had sex with someone who has hepatitis B.  You get hemodialysis treatment.  You take certain medicines for conditions, including cancer, organ transplantation, and autoimmune conditions. Hepatitis C  Blood testing is recommended for:  Everyone born from [birth year] through 1964.  Anyone with known risk factors for hepatitis C. Sexually transmitted infections (STIs)  You should be screened for sexually transmitted infections (STIs) including gonorrhea and chlamydia if:  You are sexually active and are younger than 44 years of age.  You are older than 44 years of age and your health care provider tells you that you are at risk for this type of infection.  Your sexual activity has changed since you were last screened and you are at an increased risk for chlamydia or gonorrhea. Ask your health care provider if you are at risk.  If you do not have HIV, but are at risk, it may be recommended that you take a prescription medicine daily to prevent HIV infection. This is called pre-exposure prophylaxis (PrEP). You are considered at risk if:  You are sexually active and do not regularly use condoms or know the HIV status of your partner(s).  You take drugs by injection.  You are sexually  active with a partner who has HIV. Talk with your health care provider about whether you are at high risk of being infected with HIV. If you choose to begin PrEP, you should first be tested for HIV. You should then be tested every 3 months for as long as you are taking PrEP.  PREGNANCY   If you are premenopausal and you may become pregnant, ask your health care provider about preconception counseling.  If you may  become pregnant, take 400 to 800 micrograms (mcg) of folic acid every day.  If you want to prevent pregnancy, talk to your health care provider about birth control (contraception). OSTEOPOROSIS AND MENOPAUSE   Osteoporosis is a disease in which the bones lose minerals and strength with aging. This can result in serious bone fractures. Your risk for osteoporosis can be identified using a bone density scan.  If you are 61 years of age or older, or if you are at risk for osteoporosis and fractures, ask your health care provider if you should be screened.  Ask your health care provider whether you should take a calcium or vitamin D supplement to lower your risk for osteoporosis.  Menopause may have certain physical symptoms and risks.  Hormone replacement therapy may reduce some of these symptoms and risks. Talk to your health care provider about whether hormone replacement therapy is right for you.  HOME CARE INSTRUCTIONS   Schedule regular health, dental, and eye exams.  Stay current with your immunizations.   Do not use any tobacco products including cigarettes, chewing tobacco, or electronic cigarettes.  If you are pregnant, do not drink alcohol.  If you are breastfeeding, limit how much and how often you drink alcohol.  Limit alcohol intake to no more than 1 drink per day for nonpregnant women. One drink equals 12 ounces of beer, 5 ounces of wine, or 1 ounces of hard liquor.  Do not use street drugs.  Do not share needles.  Ask your health care provider for help if  you need support or information about quitting drugs.  Tell your health care provider if you often feel depressed.  Tell your health care provider if you have ever been abused or do not feel safe at home.   This information is not intended to replace advice given to you by your health care provider. Make sure you discuss any questions you have with your health care provider.   Document Released: 09/11/2010 Document Revised: 03/19/2014 Document Reviewed: 01/28/2013 Elsevier Interactive Patient Education [employer].

## 2015-06-02 NOTE — Progress Notes (Signed)
   Subjective:    Patient ID: [name] [name], female    DOB: December 10, 1970, 45 y.o.   MRN: [id number]  HPI  Pt in today for follow up on thyroid meds Started using CPAP machine at night and is feeling much better. Has signed up for a nutrition plan at chiropractors office. Has some white dry patches on ankles that she is concerned about  Review of Systems  Constitutional: Negative.   HENT: Negative.   Eyes: Negative.   Respiratory: Negative.   Cardiovascular: Negative.   Gastrointestinal: Negative.   Endocrine: Negative.   Genitourinary: Negative.   Musculoskeletal: Negative.   Skin: Negative.   Allergic/Immunologic: Negative.   Neurological: Negative.   Hematological: Negative.   Psychiatric/Behavioral: Negative.         Objective:   Physical Exam  Constitutional: [name] oriented to person, place, and time. Vital signs are normal. She appears well-developed and well-nourished.  HENT:  Right Ear: Hearing, tympanic membrane, external ear and ear canal normal.  Left Ear: Hearing, tympanic membrane, external ear and ear canal normal.  Nose: Nose normal.  Mouth/Throat: Uvula is midline, oropharynx is clear and moist and mucous membranes are normal.  Eyes: Conjunctivae are normal. Pupils are equal, round, and reactive to light.  Neck: Normal range of motion.  Cardiovascular: Normal rate, regular rhythm, normal heart sounds and intact distal pulses.   Pulmonary/Chest: Effort normal and breath sounds normal.  Abdominal: Soft. Bowel sounds are normal.  Musculoskeletal: Normal range of motion.  Neurological: [name] alert and oriented to person, place, and time.  Skin: Skin is warm and dry.  White patches on both feet and ankles  Psychiatric: She has a normal mood and affect. Her behavior is normal. Judgment and thought content normal.  BP 110/73 mmHg  Pulse 95  Temp(Src) 97.7 F (36.5 C) (Oral)  Ht '5\' 6"'$  (1.676 m)  Wt 248 lb (112.492 kg)  BMI 40.05 kg/m2       Assessment &  Plan:  1. Dermatitis - Ambulatory referral to Dermatology  2. OSA (obstructive sleep apnea) Cont to use CPAP  3. Hypothyroidism, unspecified hypothyroidism type - levothyroxine (SYNTHROID, LEVOTHROID) 112 MCG tablet; TAKE 1 TABLET BY MOUTH EVERY DAY BEFORE BREAKFAST  Dispense: 30 tablet; Refill: 5 - CMP14+EGFR - Lipid panel - Thyroid Panel With TSH  4. Palpitations  5. Morbid obesity, unspecified obesity type ([place]) Low fat diet. Keep appt with nutritionist  6. Migraine with status migrainosus, not intractable, unspecified migraine type - propranolol (INDERAL) 80 MG tablet; Take 1 tablet (80 mg total) by mouth daily.  Dispense: 30 tablet; Refill: 5  7. Panic attacks Stress management - clonazePAM (KLONOPIN) 0.5 MG tablet; Take 1 tablet (0.5 mg total) by mouth 2 (two) times daily.  Dispense: 60 tablet; Refill: 1  8. Gastroesophageal reflux disease without esophagitis No meals 2 hours before bedtime. No spicy foods - metoCLOPramide (REGLAN) 10 MG tablet; Take 1 tablet (10 mg total) by mouth daily.  Dispense: 30 tablet; Refill: 5 - ranitidine (ZANTAC) 150 MG tablet; Take 1 tablet (150 mg total) by mouth daily.  Dispense: 30 tablet; Refill: 5 - pantoprazole (PROTONIX) 40 MG tablet; Take 1 tablet (40 mg total) by mouth daily. 30 min prior to breakfast  Dispense: 30 tablet; Refill: 4  Return next week for PAP Continue all meds Labs pending Health Maintenance reviewed Diet and exercise encouraged RTO 6 Months Rosalio Loud FNP Student Mary-Margaret Hassell Done, FNP

## 2015-06-03 LAB — CMP14+EGFR
ALT: 39 IU/L — ABNORMAL HIGH (ref 0–32)
AST: 31 IU/L (ref 0–40)
Albumin/Globulin Ratio: 1.4 (ref 1.2–2.2)
Albumin: 4.3 g/dL (ref 3.5–5.5)
Alkaline Phosphatase: 89 IU/L (ref 39–117)
BUN/Creatinine Ratio: 18 (ref 9–23)
BUN: 14 mg/dL (ref 6–24)
Bilirubin Total: 0.2 mg/dL (ref 0.0–1.2)
CALCIUM: 9.9 mg/dL (ref 8.7–10.2)
CO2: 22 mmol/L (ref 18–29)
CREATININE: 0.79 mg/dL (ref 0.57–1.00)
Chloride: 102 mmol/L (ref 96–106)
GFR calc Af Amer: 106 mL/min/{1.73_m2} (ref >59)
GFR, EST NON AFRICAN AMERICAN: 92 mL/min/{1.73_m2} (ref >59)
GLOBULIN, TOTAL: 3.1 g/dL (ref 1.5–4.5)
Glucose: 86 mg/dL (ref 65–99)
Potassium: 4.5 mmol/L (ref 3.5–5.2)
SODIUM: 143 mmol/L (ref 134–144)
TOTAL PROTEIN: 7.4 g/dL (ref 6.0–8.5)

## 2015-06-03 LAB — LIPID PANEL
CHOL/HDL RATIO: 6.4 ratio — AB (ref 0.0–4.4)
Cholesterol, Total: 160 mg/dL (ref 100–199)
HDL: 25 mg/dL — AB (ref >39)
LDL CALC: 63 mg/dL (ref 0–99)
TRIGLYCERIDES: 359 mg/dL — AB (ref 0–149)
VLDL Cholesterol Cal: 72 mg/dL — ABNORMAL HIGH (ref 5–40)

## 2015-06-03 LAB — THYROID PANEL WITH TSH
Free Thyroxine Index: 2.3 (ref 1.2–4.9)
T3 UPTAKE RATIO: 24 % (ref 24–39)
T4, Total: 9.4 ug/dL (ref 4.5–12.0)
TSH: 2.22 u[IU]/mL (ref 0.450–4.500)

## 2015-06-10 ENCOUNTER — Encounter: Payer: Self-pay | Admitting: Nurse Practitioner

## 2015-06-10 ENCOUNTER — Ambulatory Visit (INDEPENDENT_AMBULATORY_CARE_PROVIDER_SITE_OTHER): Payer: 59 | Admitting: Nurse Practitioner

## 2015-06-10 VITALS — BP 109/74 | HR 96 | Temp 97.8°F | Ht 66.0 in | Wt 244.0 lb

## 2015-06-10 DIAGNOSIS — Z0000 Encounter for general adult medical examination without abnormal findings: Secondary | ICD-10-CM

## 2015-06-10 DIAGNOSIS — Z01419 Encounter for gynecological examination (general) (routine) without abnormal findings: Secondary | ICD-10-CM | POA: Diagnosis not present

## 2015-06-10 LAB — URINALYSIS, COMPLETE
Bilirubin, UA: NEGATIVE
Glucose, UA: NEGATIVE
KETONES UA: NEGATIVE
Leukocytes, UA: NEGATIVE
NITRITE UA: NEGATIVE
Protein, UA: NEGATIVE
RBC, UA: NEGATIVE
SPEC GRAV UA: 1.025 (ref 1.005–1.030)
Urobilinogen, Ur: 0.2 mg/dL (ref 0.2–1.0)
pH, UA: 5.5 (ref 5.0–7.5)

## 2015-06-10 LAB — MICROSCOPIC EXAMINATION

## 2015-06-10 NOTE — Progress Notes (Signed)
   Subjective:    Patient ID: [name] [name], female    DOB: 1971/03/02, 45 y.o.   MRN: [id number]  HPI Patient was seen last week for follow up of chronic medical problems. [name] here today for PAP and breast exam.    Review of Systems  Constitutional: Negative.   HENT: Negative.   Respiratory: Negative.   Cardiovascular: Negative.   Genitourinary: Negative.   Neurological: Negative.   Psychiatric/Behavioral: Negative.   All other systems reviewed and are negative.      Objective:   Physical Exam  Constitutional: [name] oriented to person, place, and time. She appears well-developed and well-nourished.  HENT:  Head: Normocephalic.  Right Ear: Hearing, tympanic membrane, external ear and ear canal normal.  Left Ear: Hearing, tympanic membrane, external ear and ear canal normal.  Nose: Nose normal.  Mouth/Throat: Uvula is midline and oropharynx is clear and moist.  Eyes: Conjunctivae and EOM are normal. Pupils are equal, round, and reactive to light.  Neck: Normal range of motion and full passive range of motion without pain. Neck supple. No JVD present. Carotid bruit is not present. No thyroid mass and no thyromegaly present.  Cardiovascular: Normal rate, normal heart sounds and intact distal pulses.   No murmur heard. Pulmonary/Chest: Effort normal and breath sounds normal. Right breast exhibits no inverted nipple, no mass, no nipple discharge, no skin change and no tenderness. Left breast exhibits no inverted nipple, no mass, no nipple discharge, no skin change and no tenderness.  Abdominal: Soft. Bowel sounds are normal. She exhibits no mass. There is no tenderness.  Genitourinary: Vagina normal and uterus normal. No breast swelling, tenderness, discharge or bleeding.  bimanual exam-No adnexal masses or tenderness. Cervix parous and pink - no discharge  Musculoskeletal: Normal range of motion.  Lymphadenopathy:    She has no cervical adenopathy.  Neurological: [name] alert  and oriented to person, place, and time.  Skin: Skin is warm and dry.  Psychiatric: She has a normal mood and affect. Her behavior is normal. Judgment and thought content normal.    BP 109/74 mmHg  Pulse 96  Temp(Src) 97.8 F (36.6 C) (Oral)  Ht 5\' 6"  (1.676 m)  Wt 244 lb (110.678 kg)  BMI 39.40 kg/m2     Assessment & Plan:  1. Annual physical exam - Urinalysis, Complete  2. Encounter for routine gynecological examination - Pap IG w/ reflex to HPV when ASC-U Follow up in 6 month- routine follow up  [place], FNP'

## 2015-06-13 ENCOUNTER — Encounter: Payer: Self-pay | Admitting: Family

## 2015-06-14 LAB — PAP IG W/ RFLX HPV ASCU: PAP SMEAR COMMENT: 0

## 2015-06-15 ENCOUNTER — Encounter: Payer: Self-pay | Admitting: *Deleted

## 2015-08-17 ENCOUNTER — Other Ambulatory Visit: Payer: Self-pay | Admitting: Nurse Practitioner

## 2015-08-18 NOTE — Telephone Encounter (Signed)
Last seen 06/10/15  MMM   If approved route to nurse to call into [facility]   [phone]

## 2015-08-18 NOTE — Telephone Encounter (Signed)
Please call in klonopin with 1 refills 

## 2015-08-23 ENCOUNTER — Other Ambulatory Visit: Payer: Self-pay | Admitting: Family

## 2015-08-23 ENCOUNTER — Telehealth: Payer: Self-pay | Admitting: Nurse Practitioner

## 2015-08-26 ENCOUNTER — Encounter: Payer: Self-pay | Admitting: Family Medicine

## 2015-08-26 ENCOUNTER — Ambulatory Visit (INDEPENDENT_AMBULATORY_CARE_PROVIDER_SITE_OTHER): Payer: 59 | Admitting: Family Medicine

## 2015-08-26 VITALS — BP 133/87 | HR 82 | Temp 98.7°F | Ht 66.0 in | Wt 244.8 lb

## 2015-08-26 DIAGNOSIS — J309 Allergic rhinitis, unspecified: Secondary | ICD-10-CM

## 2015-08-26 MED ORDER — PREDNISONE 20 MG PO TABS
ORAL_TABLET | ORAL | Status: DC
Start: 2015-08-26 — End: 2015-09-02

## 2015-08-26 NOTE — Telephone Encounter (Signed)
Pt seen today. Referral has been placed.

## 2015-08-26 NOTE — Progress Notes (Signed)
BP 133/87 mmHg  Pulse 82  Temp(Src) 98.7 F (37.1 C) (Oral)  Ht 5\' 6"  (1.676 m)  Wt 244 lb 12.8 oz (111.041 kg)  BMI 39.53 kg/m2   Subjective:    Patient ID: [name] [name], female    DOB: [birth year], 45 y.o.   MRN: [id number]  HPI: [name] [name] is a 45 y.o. female presenting on 08/26/2015 for Sinusitis and ENT Referral   HPI Sinus congestion and ear congestion Patient has sinus congestion and postnasal drainage and sneezing and coughing. Her cough is productive of yellow sputum. She denies any fevers or chills or shortness of breath or wheezing. She also has congestion in her left ear from chronic allergies. The congestion in her left ear started affect her hearing and she actually failed a hearing test at work. She has been using Flonase and an antihistamine and Mucinex to no avail.  Relevant past medical, surgical, family and social history reviewed and updated as indicated. Interim medical history since our last visit reviewed. Allergies and medications reviewed and updated.  Review of Systems  Constitutional: Negative for fever and chills.  HENT: Positive for congestion, ear pain, hearing loss, postnasal drip, rhinorrhea, sinus pressure, sneezing and sore throat. Negative for ear discharge.   Eyes: Negative for pain, redness and visual disturbance.  Respiratory: Positive for cough. Negative for chest tightness and shortness of breath.   Cardiovascular: Negative for chest pain and leg swelling.  Genitourinary: Negative for dysuria and difficulty urinating.  Musculoskeletal: Negative for back pain and gait problem.  Skin: Negative for rash.  Neurological: Negative for light-headedness and headaches.  Psychiatric/Behavioral: Negative for behavioral problems and agitation.  All other systems reviewed and are negative.   Per HPI unless specifically indicated above     Medication List       This list is accurate as of: 08/26/15  3:20 PM.  Always use your most recent med list.                 clonazePAM 0.5 MG tablet  Commonly known as:  KLONOPIN  TAKE 1 TABLET BY MOUTH TWICE DAILY     cyclobenzaprine 10 MG tablet  Commonly known as:  FLEXERIL  TAKE 1 TABLET BY MOUTH THREE TIMES DAILY     fexofenadine 180 MG tablet  Commonly known as:  ALLEGRA  Take 180 mg by mouth daily.     FLONASE 50 MCG/ACT nasal spray  Generic drug:  fluticasone  Place 2 sprays into both nostrils daily.     levothyroxine 112 MCG tablet  Commonly known as:  SYNTHROID, LEVOTHROID  TAKE 1 TABLET BY MOUTH EVERY DAY BEFORE BREAKFAST     metoCLOPramide 10 MG tablet  Commonly known as:  REGLAN  Take 1 tablet (10 mg total) by mouth daily.     oxybutynin 5 MG 24 hr tablet  Commonly known as:  DITROPAN-XL     pantoprazole 40 MG tablet  Commonly known as:  PROTONIX  Take 1 tablet (40 mg total) by mouth daily. 30 min prior to breakfast     predniSONE 20 MG tablet  Commonly known as:  DELTASONE  2 po at same time daily for 5 days     propranolol 80 MG tablet  Commonly known as:  INDERAL  Take 1 tablet (80 mg total) by mouth daily.     ranitidine 150 MG tablet  Commonly known as:  ZANTAC  Take 1 tablet (150 mg total) by mouth daily.  Objective:    BP 133/87 mmHg  Pulse 82  Temp(Src) 98.7 F (37.1 C) (Oral)  Ht 5\' 6"  (1.676 m)  Wt 244 lb 12.8 oz (111.041 kg)  BMI 39.53 kg/m2  Wt Readings from Last 3 Encounters:  08/26/15 244 lb 12.8 oz (111.041 kg)  06/10/15 244 lb (110.678 kg)  06/02/15 248 lb (112.492 kg)    Physical Exam  Constitutional: [name] oriented to person, place, and time. She appears well-developed and well-nourished. No distress.  HENT:  Right Ear: Tympanic membrane, external ear and ear canal normal.  Left Ear: Tympanic membrane, external ear and ear canal normal.  Nose: Mucosal edema and rhinorrhea present. No epistaxis. Right sinus exhibits no maxillary sinus tenderness and no frontal sinus tenderness. Left sinus exhibits no maxillary  sinus tenderness and no frontal sinus tenderness.  Mouth/Throat: Uvula is midline and mucous membranes are normal. Posterior oropharyngeal edema and posterior oropharyngeal erythema present. No oropharyngeal exudate or tonsillar abscesses.  Eyes: Conjunctivae and EOM are normal.  Cardiovascular: Normal rate, regular rhythm, normal heart sounds and intact distal pulses.   No murmur heard. Pulmonary/Chest: Effort normal and breath sounds normal. No respiratory distress. She has no wheezes.  Musculoskeletal: Normal range of motion. She exhibits no edema or tenderness.  Neurological: [name] alert and oriented to person, place, and time. Coordination normal.  Skin: Skin is warm and dry. No rash noted. [name] not diaphoretic.  Psychiatric: She has a normal mood and affect. Her behavior is normal.  Vitals reviewed.     Assessment & Plan:       Problem List Items Addressed This Visit    None    Visit Diagnoses    Allergic rhinitis, unspecified allergic rhinitis type    -  Primary    Relevant Medications    predniSONE (DELTASONE) 20 MG tablet    Other Relevant Orders    Ambulatory referral to ENT        Follow up plan: Return if symptoms worsen or fail to improve.  Counseling provided for all of the vaccine components Orders Placed This Encounter  Procedures  . Ambulatory referral to ENT    Caryl Pina, MD [facility] 08/26/2015, 3:20 PM

## 2015-08-29 ENCOUNTER — Telehealth: Payer: Self-pay | Admitting: Family Medicine

## 2015-08-29 MED ORDER — AZITHROMYCIN 250 MG PO TABS: ORAL_TABLET | ORAL | Status: DC

## 2015-08-29 NOTE — Telephone Encounter (Signed)
We have already done the referral to ENT. If she wants we can try azithromycin or Z-Pak

## 2015-08-29 NOTE — Telephone Encounter (Signed)
Patient aware that zpak has been sent to the pharmacy.

## 2015-08-29 NOTE — Telephone Encounter (Signed)
Patient states that she is still stopped up in her head/nose. She just does not feel like she is getting much better. Please advise

## 2015-09-02 ENCOUNTER — Ambulatory Visit (INDEPENDENT_AMBULATORY_CARE_PROVIDER_SITE_OTHER): Payer: 59 | Admitting: Pulmonary Disease

## 2015-09-02 ENCOUNTER — Encounter: Payer: Self-pay | Admitting: Pulmonary Disease

## 2015-09-02 VITALS — BP 104/78 | HR 90 | Ht 66.0 in | Wt 244.6 lb

## 2015-09-02 DIAGNOSIS — G4733 Obstructive sleep apnea (adult) (pediatric): Secondary | ICD-10-CM | POA: Diagnosis not present

## 2015-09-02 NOTE — Progress Notes (Signed)
Current Outpatient Prescriptions on File Prior to Visit  Medication Sig  . clonazePAM (KLONOPIN) 0.5 MG tablet TAKE 1 TABLET BY MOUTH TWICE DAILY  . cyclobenzaprine (FLEXERIL) 10 MG tablet TAKE 1 TABLET BY MOUTH THREE TIMES DAILY  . fexofenadine (ALLEGRA) 180 MG tablet Take 180 mg by mouth daily.  . fluticasone (FLONASE) 50 MCG/ACT nasal spray Place 2 sprays into both nostrils daily.  Marland Kitchen levothyroxine (SYNTHROID, LEVOTHROID) 112 MCG tablet TAKE 1 TABLET BY MOUTH EVERY DAY BEFORE BREAKFAST  . metoCLOPramide (REGLAN) 10 MG tablet Take 1 tablet (10 mg total) by mouth daily.  Marland Kitchen oxybutynin (DITROPAN-XL) 5 MG 24 hr tablet   . pantoprazole (PROTONIX) 40 MG tablet Take 1 tablet (40 mg total) by mouth daily. 30 min prior to breakfast  . propranolol (INDERAL) 80 MG tablet Take 1 tablet (80 mg total) by mouth daily.  . ranitidine (ZANTAC) 150 MG tablet Take 1 tablet (150 mg total) by mouth daily.   No current facility-administered medications on file prior to visit.     Chief Complaint  Patient presents with  . Follow-up    Wears CPAP nightly. Denies problems with mask or pressure. DME: [facility]     Tests HST 03/02/15 >> AHI 92.2, SaO2 low 47%. Auto CPAP 08/02/15 to 08/31/15 >> used on 30 of 30 nights with average 8 hrs 15 min.  Average AHI 0.5 with median CPAP 11 and 95 th percentile CPAP 13 cm H2O  Past medical hx Hypothyroidism, Anxiety, Palpitations, HA, HLD, Allergies, GERD  Past surgical hx, Allergies, Family hx, Social hx all reviewed.  Vital Signs BP 104/78 mmHg  Pulse 90  Ht 5\' 6"  (1.676 m)  Wt 244 lb 9.6 oz (110.95 kg)  BMI 39.50 kg/m2  SpO2 97%  History of Present Illness [name] [name] is a 45 y.o. female with obstructive sleep apnea.  She feels so much better since starting CPAP.  [name] no longer needing naps.  She uses nasal pillows >> no issue with mask fit.  She stopped smoking earlier this month.  [name] working on her weight.    Physical  Exam  General - No distress ENT - No sinus tenderness, no oral exudate, no LAN, MP 3, scalloped tongue Cardiac - s1s2 regular, no murmur Chest - No wheeze/rales/dullness Back - No focal tenderness Abd - Soft, non-tender Ext - No edema Neuro - Normal strength Skin - No rashes Psych - normal mood, and behavior   Assessment/Plan  Obstructive sleep apnea. - continue auto CPAP  Obesity. - encouraged her to keep up with her weight loss efforts   Patient Instructions  Follow up in 1 year     Chesley Mires, MD [facility] Pager:  [phone] 09/02/2015, 4:48 PM

## 2015-09-02 NOTE — Patient Instructions (Signed)
Follow up in 1 year.

## 2015-09-05 ENCOUNTER — Telehealth: Payer: Self-pay | Admitting: Nurse Practitioner

## 2015-09-05 DIAGNOSIS — Z0289 Encounter for other administrative examinations: Secondary | ICD-10-CM

## 2015-09-06 NOTE — Telephone Encounter (Signed)
Yes we have, pt aware

## 2015-09-20 ENCOUNTER — Other Ambulatory Visit: Payer: Self-pay | Admitting: Nurse Practitioner

## 2015-09-26 ENCOUNTER — Ambulatory Visit (INDEPENDENT_AMBULATORY_CARE_PROVIDER_SITE_OTHER): Payer: 59 | Admitting: Otolaryngology

## 2015-09-26 DIAGNOSIS — H6522 Chronic serous otitis media, left ear: Secondary | ICD-10-CM

## 2015-09-26 DIAGNOSIS — H6982 Other specified disorders of Eustachian tube, left ear: Secondary | ICD-10-CM

## 2015-09-26 DIAGNOSIS — H9012 Conductive hearing loss, unilateral, left ear, with unrestricted hearing on the contralateral side: Secondary | ICD-10-CM

## 2015-09-26 DIAGNOSIS — J343 Hypertrophy of nasal turbinates: Secondary | ICD-10-CM | POA: Diagnosis not present

## 2015-10-17 ENCOUNTER — Other Ambulatory Visit: Payer: Self-pay | Admitting: Nurse Practitioner

## 2015-10-17 NOTE — Telephone Encounter (Signed)
Please call in klonopin with 1 refills 

## 2015-10-17 NOTE — Telephone Encounter (Signed)
Last filled 09/20/15, last seen 06/10/15. Call in

## 2015-11-07 ENCOUNTER — Ambulatory Visit (INDEPENDENT_AMBULATORY_CARE_PROVIDER_SITE_OTHER): Payer: 59 | Admitting: Otolaryngology

## 2015-11-17 ENCOUNTER — Other Ambulatory Visit: Payer: Self-pay | Admitting: Nurse Practitioner

## 2015-11-17 DIAGNOSIS — Z1231 Encounter for screening mammogram for malignant neoplasm of breast: Secondary | ICD-10-CM

## 2015-11-18 ENCOUNTER — Ambulatory Visit (HOSPITAL_COMMUNITY)
Admission: RE | Admit: 2015-11-18 | Discharge: 2015-11-18 | Disposition: A | Discharge status: Home / Self Care | Payer: 59 | Source: Ambulatory Visit | Attending: Nurse Practitioner | Admitting: Nurse Practitioner

## 2015-11-18 DIAGNOSIS — Z1231 Encounter for screening mammogram for malignant neoplasm of breast: Secondary | ICD-10-CM | POA: Insufficient documentation

## 2015-11-19 ENCOUNTER — Other Ambulatory Visit: Payer: Self-pay | Admitting: Family Medicine

## 2015-12-14 ENCOUNTER — Other Ambulatory Visit: Payer: Self-pay | Admitting: Nurse Practitioner

## 2015-12-14 DIAGNOSIS — E039 Hypothyroidism, unspecified: Secondary | ICD-10-CM

## 2015-12-15 ENCOUNTER — Ambulatory Visit (INDEPENDENT_AMBULATORY_CARE_PROVIDER_SITE_OTHER): Payer: 59 | Admitting: Otolaryngology

## 2015-12-15 DIAGNOSIS — H7202 Central perforation of tympanic membrane, left ear: Secondary | ICD-10-CM | POA: Diagnosis not present

## 2015-12-15 DIAGNOSIS — H6983 Other specified disorders of Eustachian tube, bilateral: Secondary | ICD-10-CM

## 2015-12-15 NOTE — Telephone Encounter (Signed)
Please call in clonazepam with 1 refills 

## 2015-12-15 NOTE — Telephone Encounter (Signed)
Refill called to [facility] 

## 2015-12-19 ENCOUNTER — Other Ambulatory Visit: Payer: Self-pay | Admitting: Family Medicine

## 2016-01-16 ENCOUNTER — Other Ambulatory Visit: Payer: Self-pay | Admitting: Nurse Practitioner

## 2016-01-16 DIAGNOSIS — G43901 Migraine, unspecified, not intractable, with status migrainosus: Secondary | ICD-10-CM

## 2016-01-20 ENCOUNTER — Other Ambulatory Visit: Payer: Self-pay | Admitting: Nurse Practitioner

## 2016-01-31 ENCOUNTER — Other Ambulatory Visit: Payer: Self-pay | Admitting: *Deleted

## 2016-01-31 MED ORDER — CLONAZEPAM 0.5 MG PO TABS: 0.5000 mg | ORAL_TABLET | Freq: Two times a day (BID) | ORAL | 1 refills | Status: DC

## 2016-01-31 NOTE — Telephone Encounter (Signed)
Prescription called in

## 2016-01-31 NOTE — Telephone Encounter (Signed)
Please call in klonopin with 1 refills 

## 2016-01-31 NOTE — Telephone Encounter (Signed)
Pt is requesting refills on her xanax, if approved please route to Nurse Pool B for nurse to call in.

## 2016-02-16 ENCOUNTER — Other Ambulatory Visit: Payer: Self-pay | Admitting: Nurse Practitioner

## 2016-03-14 ENCOUNTER — Other Ambulatory Visit: Payer: Self-pay | Admitting: Nurse Practitioner

## 2016-03-14 DIAGNOSIS — G43901 Migraine, unspecified, not intractable, with status migrainosus: Secondary | ICD-10-CM

## 2016-03-17 ENCOUNTER — Other Ambulatory Visit: Payer: Self-pay | Admitting: Nurse Practitioner

## 2016-03-19 NOTE — Telephone Encounter (Signed)
last given 02/20/16 Last seen in march by you for cpe

## 2016-03-27 DIAGNOSIS — R3915 Urgency of urination: Secondary | ICD-10-CM | POA: Diagnosis not present

## 2016-04-11 DIAGNOSIS — G4733 Obstructive sleep apnea (adult) (pediatric): Secondary | ICD-10-CM | POA: Diagnosis not present

## 2016-04-13 ENCOUNTER — Other Ambulatory Visit: Payer: Self-pay | Admitting: Nurse Practitioner

## 2016-04-13 NOTE — Telephone Encounter (Signed)
CPE with MMM 06/10/15  - have nurse call in to [facility]

## 2016-04-17 ENCOUNTER — Other Ambulatory Visit: Payer: Self-pay | Admitting: Nurse Practitioner

## 2016-04-17 DIAGNOSIS — G43901 Migraine, unspecified, not intractable, with status migrainosus: Secondary | ICD-10-CM

## 2016-04-18 NOTE — Telephone Encounter (Signed)
Forwarding to PCP/MMM 

## 2016-04-21 ENCOUNTER — Other Ambulatory Visit: Payer: Self-pay | Admitting: Nurse Practitioner

## 2016-04-21 DIAGNOSIS — K219 Gastro-esophageal reflux disease without esophagitis: Secondary | ICD-10-CM

## 2016-04-21 MED ORDER — PANTOPRAZOLE SODIUM 40 MG PO TBEC: 40.0000 mg | DELAYED_RELEASE_TABLET | Freq: Every day | ORAL | 0 refills | Status: DC

## 2016-04-21 NOTE — Telephone Encounter (Signed)
Refill was sent to the pharmacy for any further refills

## 2016-05-08 ENCOUNTER — Telehealth: Payer: Self-pay | Admitting: Nurse Practitioner

## 2016-05-08 DIAGNOSIS — K219 Gastro-esophageal reflux disease without esophagitis: Secondary | ICD-10-CM

## 2016-05-08 MED ORDER — PANTOPRAZOLE SODIUM 40 MG PO TBEC
40.0000 mg | DELAYED_RELEASE_TABLET | Freq: Two times a day (BID) | ORAL | 1 refills | Status: DC
Start: 2016-05-08 — End: 2016-05-25

## 2016-05-08 NOTE — Telephone Encounter (Signed)
protonix rx corrected

## 2016-05-08 NOTE — Telephone Encounter (Signed)
Lm- corrected rx was sent to the pharmacy, call back with any questions.

## 2016-05-08 NOTE — Telephone Encounter (Signed)
Patient said her Protonix was sent in incorrectly.  She said she normally takes it BID but it was sent in as taking once daily.  Please advise.

## 2016-05-09 DIAGNOSIS — G4733 Obstructive sleep apnea (adult) (pediatric): Secondary | ICD-10-CM | POA: Diagnosis not present

## 2016-05-14 ENCOUNTER — Other Ambulatory Visit: Payer: Self-pay | Admitting: Nurse Practitioner

## 2016-05-14 DIAGNOSIS — E039 Hypothyroidism, unspecified: Secondary | ICD-10-CM

## 2016-05-14 DIAGNOSIS — G43901 Migraine, unspecified, not intractable, with status migrainosus: Secondary | ICD-10-CM

## 2016-05-16 ENCOUNTER — Other Ambulatory Visit: Payer: Self-pay | Admitting: Nurse Practitioner

## 2016-05-21 ENCOUNTER — Ambulatory Visit: Payer: 59 | Admitting: Nurse Practitioner

## 2016-05-24 ENCOUNTER — Ambulatory Visit: Payer: 59 | Admitting: Nurse Practitioner

## 2016-05-25 ENCOUNTER — Ambulatory Visit (INDEPENDENT_AMBULATORY_CARE_PROVIDER_SITE_OTHER): Payer: 59 | Admitting: Nurse Practitioner

## 2016-05-25 ENCOUNTER — Encounter: Payer: Self-pay | Admitting: Nurse Practitioner

## 2016-05-25 VITALS — BP 117/68 | HR 74 | Temp 97.7°F | Ht 66.0 in | Wt 256.0 lb

## 2016-05-25 DIAGNOSIS — E039 Hypothyroidism, unspecified: Secondary | ICD-10-CM | POA: Diagnosis not present

## 2016-05-25 DIAGNOSIS — G43901 Migraine, unspecified, not intractable, with status migrainosus: Secondary | ICD-10-CM | POA: Diagnosis not present

## 2016-05-25 DIAGNOSIS — F410 Panic disorder [episodic paroxysmal anxiety] without agoraphobia: Secondary | ICD-10-CM | POA: Diagnosis not present

## 2016-05-25 DIAGNOSIS — K219 Gastro-esophageal reflux disease without esophagitis: Secondary | ICD-10-CM

## 2016-05-25 MED ORDER — RANITIDINE HCL 150 MG PO TABS: 150.0000 mg | ORAL_TABLET | Freq: Every day | ORAL | 5 refills | Status: DC

## 2016-05-25 MED ORDER — PANTOPRAZOLE SODIUM 40 MG PO TBEC: 40.0000 mg | DELAYED_RELEASE_TABLET | Freq: Two times a day (BID) | ORAL | 1 refills | Status: DC

## 2016-05-25 MED ORDER — PROPRANOLOL HCL 80 MG PO TABS: 80.0000 mg | ORAL_TABLET | Freq: Every day | ORAL | 5 refills | Status: DC

## 2016-05-25 MED ORDER — LEVOTHYROXINE SODIUM 112 MCG PO TABS: 112.0000 ug | ORAL_TABLET | Freq: Every day | ORAL | 5 refills | Status: DC

## 2016-05-25 MED ORDER — CLONAZEPAM 0.5 MG PO TABS: 0.5000 mg | ORAL_TABLET | Freq: Two times a day (BID) | ORAL | 2 refills | Status: DC | PRN

## 2016-05-25 MED ORDER — METOCLOPRAMIDE HCL 10 MG PO TABS
10.0000 mg | ORAL_TABLET | Freq: Every day | ORAL | 5 refills | Status: DC
Start: 2016-05-25 — End: 2016-11-30

## 2016-05-25 NOTE — Progress Notes (Signed)
Subjective:    Patient ID: [name] [name], female    DOB: [birth year], 46 y.o.   MRN: [id number]  HPI Patient comes in today for follow up of chronic medical problems.  -Panic attacks- takes klonopin 0.'5mg'$  bid- helps keep occurrences under control. -GERD- reglan, zantac and protonix daily- helps with her symptoms -hypothyroidism- currently on levothyroxine 125mg daily- no symptoms - urinary urgency- takes ditropan daily- helps with symptoms- still has occasional urgency but not daily. - SVT- on inderal daily to keep heart rate down- no recent palpitations  Review of Systems  Constitutional: Negative for diaphoresis.  Eyes: Negative for pain.  Respiratory: Negative for shortness of breath.   Cardiovascular: Negative for chest pain, palpitations and leg swelling.  Gastrointestinal: Negative for abdominal pain.  Endocrine: Negative for polydipsia.  Skin: Negative for rash.  Neurological: Negative for dizziness, weakness and headaches.  Hematological: Does not bruise/bleed easily.       Objective:   Physical Exam  Constitutional: [name] oriented to person, place, and time. She appears well-developed and well-nourished.  HENT:  Nose: Nose normal.  Mouth/Throat: Oropharynx is clear and moist.  Eyes: EOM are normal.  Neck: Trachea normal, normal range of motion and full passive range of motion without pain. Neck supple. No JVD present. Carotid bruit is not present. No thyromegaly present.  Cardiovascular: Normal rate, regular rhythm, normal heart sounds and intact distal pulses.  Exam reveals no gallop and no friction rub.   No murmur heard. Pulmonary/Chest: Effort normal and breath sounds normal.  Abdominal: Soft. Bowel sounds are normal. She exhibits no distension and no mass. There is no tenderness.  Musculoskeletal: Normal range of motion.  Lymphadenopathy:    She has no cervical adenopathy.  Neurological: [name] alert and oriented to person, place, and time. She has normal  reflexes.  Skin: Skin is warm and dry.  Psychiatric: She has a normal mood and affect. Her behavior is normal. Judgment and thought content normal.   BP 117/68   Pulse 74   Temp 97.7 F (36.5 C) (Oral)   Ht '5\' 6"'$  (1.676 m)   Wt 256 lb (116.1 kg)   BMI 41.32 kg/m    ASSESSMENT and PLAN 1. Migraine with status migrainosus, not intractable, unspecified migraine type Avoid caffeine - propranolol (INDERAL) 80 MG tablet; Take 1 tablet (80 mg total) by mouth daily.  Dispense: 30 tablet; Refill: 5  2. Hypothyroidism, unspecified type - CMP14+EGFR - Lipid panel - Thyroid Panel With TSH  3. [place] Discussed diet and exercise for person with BMI >25 Will recheck weight in 3-6 months  4. Panic attacks Stress manmagement  5. Gastroesophageal reflux disease without esophagitis Avoid spicy foods Do not eat 2 hours prior to bedtime - ranitidine (ZANTAC) 150 MG tablet; Take 1 tablet (150 mg total) by mouth daily.  Dispense: 30 tablet; Refill: 5 - metoCLOPramide (REGLAN) 10 MG tablet; Take 1 tablet (10 mg total) by mouth daily.  Dispense: 30 tablet; Refill: 5 - pantoprazole (PROTONIX) 40 MG tablet; Take 1 tablet (40 mg total) by mouth 2 (two) times daily. 30 min prior to breakfast  Dispense: 60 tablet; Refill: 1  6. Acquired hypothyroidism - levothyroxine (SYNTHROID, LEVOTHROID) 112 MCG tablet; Take 1 tablet (112 mcg total) by mouth daily before breakfast.  Dispense: 30 tablet; Refill: 5    Labs pending Health maintenance reviewed Diet and exercise encouraged Continue all meds Follow up  In 6 months   [place] FNP

## 2016-05-25 NOTE — Patient Instructions (Signed)
Health Maintenance, Female Adopting a healthy lifestyle and getting preventive care can go a long way to promote health and wellness. Talk with your health care provider about what schedule of regular examinations is right for you. This is a good chance for you to check in with your provider about disease prevention and staying healthy. In between checkups, there are plenty of things you can do on your own. Experts have done a lot of research about which lifestyle changes and preventive measures are most likely to keep you healthy. Ask your health care provider for more information. Weight and diet Eat a healthy diet  Be sure to include plenty of vegetables, fruits, low-fat dairy products, and lean protein.  Do not eat a lot of foods high in solid fats, added sugars, or salt.  Get regular exercise. This is one of the most important things you can do for your health.  Most adults should exercise for at least 150 minutes each week. The exercise should increase your heart rate and make you sweat (moderate-intensity exercise).  Most adults should also do strengthening exercises at least twice a week. This is in addition to the moderate-intensity exercise. Maintain a healthy weight  Body mass index (BMI) is a measurement that can be used to identify possible weight problems. It estimates body fat based on height and weight. Your health care provider can help determine your BMI and help you achieve or maintain a healthy weight.  For females 45 years of age and older:  A BMI below 18.5 is considered underweight.  A BMI of 18.5 to 24.9 is normal.  A BMI of 25 to 29.9 is considered overweight.  A BMI of 30 and above is considered obese. Watch levels of cholesterol and blood lipids  You should start having your blood tested for lipids and cholesterol at 45 years of age, then have this test every 5 years.  You may need to have your cholesterol levels checked more often if:  Your lipid or  cholesterol levels are high.  You are older than 45 years of age.  You are at high risk for heart disease. Cancer screening Lung Cancer  Lung cancer screening is recommended for adults 64-42 years old who are at high risk for lung cancer because of a history of smoking.  A yearly low-dose CT scan of the lungs is recommended for people who:  Currently smoke.  Have quit within the past 15 years.  Have at least a 30-pack-year history of smoking. A pack year is smoking an average of one pack of cigarettes a day for 1 year.  Yearly screening should continue until it has been 15 years since you quit.  Yearly screening should stop if you develop a health problem that would prevent you from having lung cancer treatment. Breast Cancer  Practice breast self-awareness. This means understanding how your breasts normally appear and feel.  It also means doing regular breast self-exams. Let your health care provider know about any changes, no matter how small.  If you are in your 20s or 30s, you should have a clinical breast exam (CBE) by a health care provider every 1-3 years as part of a regular health exam.  If you are 34 or older, have a CBE every year. Also consider having a breast X-ray (mammogram) every year.  If you have a family history of breast cancer, talk to your health care provider about genetic screening.  If you are at high risk for breast cancer, talk  to your health care provider about having an MRI and a mammogram every year.  Breast cancer gene (BRCA) assessment is recommended for women who have family members with BRCA-related cancers. BRCA-related cancers include:  Breast.  Ovarian.  Tubal.  Peritoneal cancers.  Results of the assessment will determine the need for genetic counseling and BRCA1 and BRCA2 testing. Cervical Cancer  Your health care provider may recommend that you be screened regularly for cancer of the pelvic organs (ovaries, uterus, and vagina).  This screening involves a pelvic examination, including checking for microscopic changes to the surface of your cervix (Pap test). You may be encouraged to have this screening done every 3 years, beginning at age 24.  For women ages 66-65, health care providers may recommend pelvic exams and Pap testing every 3 years, or they may recommend the Pap and pelvic exam, combined with testing for human papilloma virus (HPV), every 5 years. Some types of HPV increase your risk of cervical cancer. Testing for HPV may also be done on women of any age with unclear Pap test results.  Other health care providers may not recommend any screening for nonpregnant women who are considered low risk for pelvic cancer and who do not have symptoms. Ask your health care provider if a screening pelvic exam is right for you.  If you have had past treatment for cervical cancer or a condition that could lead to cancer, you need Pap tests and screening for cancer for at least 20 years after your treatment. If Pap tests have been discontinued, your risk factors (such as having a new sexual partner) need to be reassessed to determine if screening should resume. Some women have medical problems that increase the chance of getting cervical cancer. In these cases, your health care provider may recommend more frequent screening and Pap tests. Colorectal Cancer  This type of cancer can be detected and often prevented.  Routine colorectal cancer screening usually begins at 45 years of age and continues through 45 years of age.  Your health care provider may recommend screening at an earlier age if you have risk factors for colon cancer.  Your health care provider may also recommend using home test kits to check for hidden blood in the stool.  A small camera at the end of a tube can be used to examine your colon directly (sigmoidoscopy or colonoscopy). This is done to check for the earliest forms of colorectal cancer.  Routine  screening usually begins at age 41.  Direct examination of the colon should be repeated every 5-10 years through 45 years of age. However, you may need to be screened more often if early forms of precancerous polyps or small growths are found. Skin Cancer  Check your skin from head to toe regularly.  Tell your health care provider about any new moles or changes in moles, especially if there is a change in a mole's shape or color.  Also tell your health care provider if you have a mole that is larger than the size of a pencil eraser.  Always use sunscreen. Apply sunscreen liberally and repeatedly throughout the day.  Protect yourself by wearing long sleeves, pants, a wide-brimmed hat, and sunglasses whenever you are outside. Heart disease, diabetes, and high blood pressure  High blood pressure causes heart disease and increases the risk of stroke. High blood pressure is more likely to develop in:  People who have blood pressure in the high end of the normal range (130-139/85-89 mm Hg).  People who are overweight or obese.  People who are African American.  If you are 59-24 years of age, have your blood pressure checked every 3-5 years. If you are 34 years of age or older, have your blood pressure checked every year. You should have your blood pressure measured twice-once when you are at a hospital or clinic, and once when you are not at a hospital or clinic. Record the average of the two measurements. To check your blood pressure when you are not at a hospital or clinic, you can use:  An automated blood pressure machine at a pharmacy.  A home blood pressure monitor.  If you are between 29 years and 60 years old, ask your health care provider if you should take aspirin to prevent strokes.  Have regular diabetes screenings. This involves taking a blood sample to check your fasting blood sugar level.  If you are at a normal weight and have a low risk for diabetes, have this test once  every three years after 45 years of age.  If you are overweight and have a high risk for diabetes, consider being tested at a younger age or more often. Preventing infection Hepatitis B  If you have a higher risk for hepatitis B, you should be screened for this virus. You are considered at high risk for hepatitis B if:  You were born in a country where hepatitis B is common. Ask your health care provider which countries are considered high risk.  Your parents were born in a high-risk country, and you have not been immunized against hepatitis B (hepatitis B vaccine).  You have HIV or AIDS.  You use needles to inject street drugs.  You live with someone who has hepatitis B.  You have had sex with someone who has hepatitis B.  You get hemodialysis treatment.  You take certain medicines for conditions, including cancer, organ transplantation, and autoimmune conditions. Hepatitis C  Blood testing is recommended for:  Everyone born from [birth year] through 1964.  Anyone with known risk factors for hepatitis C. Sexually transmitted infections (STIs)  You should be screened for sexually transmitted infections (STIs) including gonorrhea and chlamydia if:  You are sexually active and are younger than 45 years of age.  You are older than 45 years of age and your health care provider tells you that you are at risk for this type of infection.  Your sexual activity has changed since you were last screened and you are at an increased risk for chlamydia or gonorrhea. Ask your health care provider if you are at risk.  If you do not have HIV, but are at risk, it may be recommended that you take a prescription medicine daily to prevent HIV infection. This is called pre-exposure prophylaxis (PrEP). You are considered at risk if:  You are sexually active and do not regularly use condoms or know the HIV status of your partner(s).  You take drugs by injection.  You are sexually active with a partner  who has HIV. Talk with your health care provider about whether you are at high risk of being infected with HIV. If you choose to begin PrEP, you should first be tested for HIV. You should then be tested every 3 months for as long as you are taking PrEP. Pregnancy  If you are premenopausal and you may become pregnant, ask your health care provider about preconception counseling.  If you may become pregnant, take 400 to 800 micrograms (mcg) of folic acid  every day.  If you want to prevent pregnancy, talk to your health care provider about birth control (contraception). Osteoporosis and menopause  Osteoporosis is a disease in which the bones lose minerals and strength with aging. This can result in serious bone fractures. Your risk for osteoporosis can be identified using a bone density scan.  If you are 4 years of age or older, or if you are at risk for osteoporosis and fractures, ask your health care provider if you should be screened.  Ask your health care provider whether you should take a calcium or vitamin D supplement to lower your risk for osteoporosis.  Menopause may have certain physical symptoms and risks.  Hormone replacement therapy may reduce some of these symptoms and risks. Talk to your health care provider about whether hormone replacement therapy is right for you. Follow these instructions at home:  Schedule regular health, dental, and eye exams.  Stay current with your immunizations.  Do not use any tobacco products including cigarettes, chewing tobacco, or electronic cigarettes.  If you are pregnant, do not drink alcohol.  If you are breastfeeding, limit how much and how often you drink alcohol.  Limit alcohol intake to no more than 1 drink per day for nonpregnant women. One drink equals 12 ounces of beer, 5 ounces of wine, or 1 ounces of hard liquor.  Do not use street drugs.  Do not share needles.  Ask your health care provider for help if you need support  or information about quitting drugs.  Tell your health care provider if you often feel depressed.  Tell your health care provider if you have ever been abused or do not feel safe at home. This information is not intended to replace advice given to you by your health care provider. Make sure you discuss any questions you have with your health care provider. Document Released: 09/11/2010 Document Revised: 08/04/2015 Document Reviewed: 11/30/2014 Elsevier Interactive Patient Education  2017 [employer].

## 2016-05-26 LAB — CMP14+EGFR
ALT: 37 IU/L — AB (ref 0–32)
AST: 38 IU/L (ref 0–40)
Albumin/Globulin Ratio: 1.4 (ref 1.2–2.2)
Albumin: 4.1 g/dL (ref 3.5–5.5)
Alkaline Phosphatase: 88 IU/L (ref 39–117)
BILIRUBIN TOTAL: 0.3 mg/dL (ref 0.0–1.2)
BUN/Creatinine Ratio: 13 (ref 9–23)
BUN: 11 mg/dL (ref 6–24)
CALCIUM: 9 mg/dL (ref 8.7–10.2)
CHLORIDE: 103 mmol/L (ref 96–106)
CO2: 23 mmol/L (ref 18–29)
Creatinine, Ser: 0.87 mg/dL (ref 0.57–1.00)
GFR calc non Af Amer: 81 mL/min/{1.73_m2} (ref >59)
GFR, EST AFRICAN AMERICAN: 94 mL/min/{1.73_m2} (ref >59)
GLUCOSE: 93 mg/dL (ref 65–99)
Globulin, Total: 2.9 g/dL (ref 1.5–4.5)
Potassium: 4.2 mmol/L (ref 3.5–5.2)
Sodium: 143 mmol/L (ref 134–144)
TOTAL PROTEIN: 7 g/dL (ref 6.0–8.5)

## 2016-05-26 LAB — THYROID PANEL WITH TSH
Free Thyroxine Index: 1.8 (ref 1.2–4.9)
T3 Uptake Ratio: 25 % (ref 24–39)
T4, Total: 7.2 ug/dL (ref 4.5–12.0)
TSH: 1.91 u[IU]/mL (ref 0.450–4.500)

## 2016-05-26 LAB — LIPID PANEL
Chol/HDL Ratio: 4.1 ratio units (ref 0.0–4.4)
Cholesterol, Total: 145 mg/dL (ref 100–199)
HDL: 35 mg/dL — AB (ref >39)
LDL Calculated: 59 mg/dL (ref 0–99)
TRIGLYCERIDES: 257 mg/dL — AB (ref 0–149)
VLDL CHOLESTEROL CAL: 51 mg/dL — AB (ref 5–40)

## 2016-06-06 ENCOUNTER — Encounter: Payer: Self-pay | Admitting: Internal Medicine

## 2016-06-09 DIAGNOSIS — G4733 Obstructive sleep apnea (adult) (pediatric): Secondary | ICD-10-CM | POA: Diagnosis not present

## 2016-06-12 ENCOUNTER — Other Ambulatory Visit: Payer: Self-pay | Admitting: Family

## 2016-06-12 NOTE — Telephone Encounter (Signed)
Seen twice in FEB 2018 - [facility]  Please address and route to nurse for phone in

## 2016-06-14 ENCOUNTER — Ambulatory Visit (INDEPENDENT_AMBULATORY_CARE_PROVIDER_SITE_OTHER): Payer: 59 | Admitting: Otolaryngology

## 2016-06-14 NOTE — Telephone Encounter (Signed)
Please call in klonopin with 1 refills 

## 2016-06-14 NOTE — Telephone Encounter (Signed)
rx called into pharmacy

## 2016-06-18 ENCOUNTER — Other Ambulatory Visit: Payer: Self-pay | Admitting: Nurse Practitioner

## 2016-06-18 ENCOUNTER — Ambulatory Visit (INDEPENDENT_AMBULATORY_CARE_PROVIDER_SITE_OTHER): Payer: 59 | Admitting: Otolaryngology

## 2016-07-09 DIAGNOSIS — G4733 Obstructive sleep apnea (adult) (pediatric): Secondary | ICD-10-CM | POA: Diagnosis not present

## 2016-07-16 ENCOUNTER — Other Ambulatory Visit: Payer: Self-pay | Admitting: Nurse Practitioner

## 2016-07-19 ENCOUNTER — Ambulatory Visit (INDEPENDENT_AMBULATORY_CARE_PROVIDER_SITE_OTHER): Payer: 59 | Admitting: Otolaryngology

## 2016-07-19 DIAGNOSIS — H7202 Central perforation of tympanic membrane, left ear: Secondary | ICD-10-CM | POA: Diagnosis not present

## 2016-07-19 DIAGNOSIS — H6983 Other specified disorders of Eustachian tube, bilateral: Secondary | ICD-10-CM | POA: Diagnosis not present

## 2016-07-19 DIAGNOSIS — H6123 Impacted cerumen, bilateral: Secondary | ICD-10-CM | POA: Diagnosis not present

## 2016-07-25 ENCOUNTER — Encounter: Payer: Self-pay | Admitting: Physician Assistant

## 2016-07-25 ENCOUNTER — Ambulatory Visit (INDEPENDENT_AMBULATORY_CARE_PROVIDER_SITE_OTHER): Payer: 59 | Admitting: Physician Assistant

## 2016-07-25 VITALS — BP 104/66 | HR 68 | Temp 97.5°F | Ht 66.0 in | Wt 250.2 lb

## 2016-07-25 DIAGNOSIS — R42 Dizziness and giddiness: Secondary | ICD-10-CM

## 2016-07-25 MED ORDER — MECLIZINE HCL 25 MG PO TABS: 25.0000 mg | ORAL_TABLET | Freq: Three times a day (TID) | ORAL | 0 refills | Status: DC | PRN

## 2016-07-25 NOTE — Patient Instructions (Signed)
vertigo Labyrinthitis Labyrinthitis is an infection of the inner ear. Your inner ear is a system of tubes and canals (labyrinth). These are filled with fluid. Nerve cells in your inner ear send signals for hearing and balance to your brain. When tiny germs get inside the tubes and canals, they harm the cells that send messages to the brain. This can cause changes in hearing and balance. Follow these instructions at home:  Take medicines only as told by your doctor.  If you were prescribed an antibiotic medicine, finish all of it even if you start to feel better.  Rest as much as possible.  Avoid loud noises and bright lights.  Do not make sudden movements until any dizziness goes away.  Do not drive until your doctor says that you can.  Drink enough fluid to keep your pee (urine) clear or pale yellow.  Work with a physical therapist if you still feel dizzy after several weeks. A therapist can teach you exercises to help you deal with your dizziness.  Keep all follow-up visits as told by your doctor. This is important. Contact a doctor if:  Your symptoms do not get better with medicines.  You do not get better after two weeks.  You have a fever. Get help right away if:  You are very dizzy.  You keep throwing up (vomiting) or keep feeling sick to your stomach (nauseous).  Your hearing gets a lot worse very quickly. This information is not intended to replace advice given to you by your health care provider. Make sure you discuss any questions you have with your health care provider. Document Released: 02/26/2005 Document Revised: 08/04/2015 Document Reviewed: 12/08/2013 Elsevier Interactive Patient Education  2017 [employer]  Benign Positional Vertigo Vertigo is the feeling that you or your surroundings are moving when they are not. Benign positional vertigo is the most common form of vertigo. The cause of this condition is not serious (is benign). This condition is  triggered by certain movements and positions (is positional). This condition can be dangerous if it occurs while you are doing something that could endanger you or others, such as driving. What are the causes? In many cases, the cause of this condition is not known. It may be caused by a disturbance in an area of the inner ear that helps your brain to sense movement and balance. This disturbance can be caused by a viral infection (labyrinthitis), head injury, or repetitive motion. What increases the risk? This condition is more likely to develop in:  Women.  People who are 51 years of age or older. What are the signs or symptoms? Symptoms of this condition usually happen when you move your head or your eyes in different directions. Symptoms may start suddenly, and they usually last for less than a minute. Symptoms may include:  Loss of balance and falling.  Feeling like you are spinning or moving.  Feeling like your surroundings are spinning or moving.  Nausea and vomiting.  Blurred vision.  Dizziness.  Involuntary eye movement (nystagmus). Symptoms can be mild and cause only slight annoyance, or they can be severe and interfere with daily life. Episodes of benign positional vertigo may return (recur) over time, and they may be triggered by certain movements. Symptoms may improve over time. How is this diagnosed? This condition is usually diagnosed by medical history and a physical exam of the head, neck, and ears. You may be referred to a health care provider who specializes in ear, nose, and  throat (ENT) problems (otolaryngologist) or a provider who specializes in disorders of the nervous system (neurologist). You may have additional testing, including:  MRI.  A CT scan.  Eye movement tests. Your health care provider may ask you to change positions quickly while he or she watches you for symptoms of benign positional vertigo, such as nystagmus. Eye movement may be tested with an  electronystagmogram (ENG), caloric stimulation, the Dix-Hallpike test, or the roll test.  An electroencephalogram (EEG). This records electrical activity in your brain.  Hearing tests. How is this treated? Usually, your health care provider will treat this by moving your head in specific positions to adjust your inner ear back to normal. Surgery may be needed in severe cases, but this is rare. In some cases, benign positional vertigo may resolve on its own in 2-4 weeks. Follow these instructions at home: Safety   Move slowly.Avoid sudden body or head movements.  Avoid driving.  Avoid operating heavy machinery.  Avoid doing any tasks that would be dangerous to you or others if a vertigo episode would occur.  If you have trouble walking or keeping your balance, try using a cane for stability. If you feel dizzy or unstable, sit down right away.  Return to your normal activities as told by your health care provider. Ask your health care provider what activities are safe for you. General instructions   Take over-the-counter and prescription medicines only as told by your health care provider.  Avoid certain positions or movements as told by your health care provider.  Drink enough fluid to keep your urine clear or pale yellow.  Keep all follow-up visits as told by your health care provider. This is important. Contact a health care provider if:  You have a fever.  Your condition gets worse or you develop new symptoms.  Your family or friends notice any behavioral changes.  Your nausea or vomiting gets worse.  You have numbness or a "pins and needles" sensation. Get help right away if:  You have difficulty speaking or moving.  You are always dizzy.  You faint.  You develop severe headaches.  You have weakness in your legs or arms.  You have changes in your hearing or vision.  You develop a stiff neck.  You develop sensitivity to light. This information is not  intended to replace advice given to you by your health care provider. Make sure you discuss any questions you have with your health care provider. Document Released: 12/04/2005 Document Revised: 08/04/2015 Document Reviewed: 06/21/2014 Elsevier Interactive Patient Education  2017 [employer].

## 2016-07-26 NOTE — Progress Notes (Signed)
BP 104/66   Pulse 68   Temp 97.5 F (36.4 C) (Oral)   Ht 5\' 6"  (1.676 m)   Wt 250 lb 3.2 oz (113.5 kg)   BMI 40.38 kg/m    Subjective:    Patient ID: [name] [name], female    DOB: March 03, 1971, 46 y.o.   MRN: [id number]  HPI: [name] [name] is a 46 y.o. female presenting on 07/25/2016 for Dizziness (x 3 days); Nausea; and Headache  She comes in today for 3 days of dizziness that is worsening. She reports headache and nausea associated with the bad episodes of dizziness.  Had slight cold and congestion lest week. Denies fever or chills.  Worsened with movement and head turning.  Relevant past medical, surgical, family and social history reviewed and updated as indicated. Allergies and medications reviewed and updated.  Past Medical History:  Diagnosis Date  . Allergy    SEASONAL  . Anxiety   . Chronic headaches   . Colon polyps   . GERD (gastroesophageal reflux disease)   . Hyperlipidemia   . Hypothyroidism   . Palpitations     Past Surgical History:  Procedure Laterality Date  . CESAREAN SECTION     1194  . COLONOSCOPY    . LITHOTRIPSY  12/2014    Review of Systems  Constitutional: Negative.  Negative for activity change, fatigue and fever.  HENT: Negative.   Eyes: Negative.   Respiratory: Negative.  Negative for cough.   Cardiovascular: Negative.  Negative for chest pain.  Gastrointestinal: Negative.  Negative for abdominal pain.  Endocrine: Negative.   Genitourinary: Negative.  Negative for dysuria.  Musculoskeletal: Negative.  Negative for arthralgias and back pain.  Skin: Negative.   Neurological: Positive for dizziness and light-headedness. Negative for syncope, speech difficulty, weakness, numbness and headaches.    Allergies as of 07/25/2016   No Known Allergies     Medication List       Accurate as of 07/25/16 11:59 PM. Always use your most recent med list.          clonazePAM 0.5 MG tablet Commonly known as:  KLONOPIN TAKE 1 TABLET BY MOUTH TWICE  DAILY AS NEEDED   cyclobenzaprine 10 MG tablet Commonly known as:  FLEXERIL TAKE 1 TABLET BY MOUTH THREE TIMES DAILY   fexofenadine 180 MG tablet Commonly known as:  ALLEGRA Take 180 mg by mouth daily.   FLONASE 50 MCG/ACT nasal spray Generic drug:  fluticasone Place 2 sprays into both nostrils daily.   levothyroxine 112 MCG tablet Commonly known as:  SYNTHROID, LEVOTHROID Take 1 tablet (112 mcg total) by mouth daily before breakfast.   meclizine 25 MG tablet Commonly known as:  ANTIVERT Take 1 tablet (25 mg total) by mouth 3 (three) times daily as needed for dizziness.   metoCLOPramide 10 MG tablet Commonly known as:  REGLAN Take 1 tablet (10 mg total) by mouth daily.   oxybutynin 5 MG 24 hr tablet Commonly known as:  DITROPAN-XL   pantoprazole 40 MG tablet Commonly known as:  PROTONIX Take 1 tablet (40 mg total) by mouth 2 (two) times daily. 30 min prior to breakfast   propranolol 80 MG tablet Commonly known as:  INDERAL Take 1 tablet (80 mg total) by mouth daily.   ranitidine 150 MG tablet Commonly known as:  ZANTAC Take 1 tablet (150 mg total) by mouth daily.          Objective:    BP 104/66   Pulse 68  Temp 97.5 F (36.4 C) (Oral)   Ht 5\' 6"  (1.676 m)   Wt 250 lb 3.2 oz (113.5 kg)   BMI 40.38 kg/m   No Known Allergies  Physical Exam  Constitutional: [name] oriented to person, place, and time. She appears well-developed and well-nourished.  HENT:  Head: Normocephalic and atraumatic.  Right Ear: Tympanic membrane, external ear and ear canal normal.  Left Ear: Tympanic membrane, external ear and ear canal normal.  Nose: Nose normal. No rhinorrhea.  Mouth/Throat: Oropharynx is clear and moist and mucous membranes are normal. No oropharyngeal exudate or posterior oropharyngeal erythema.  Eyes: Conjunctivae and EOM are normal. Pupils are equal, round, and reactive to light.  Neck: Normal range of motion. Neck supple.  Cardiovascular: Normal rate,  regular rhythm, normal heart sounds and intact distal pulses.   Pulmonary/Chest: Effort normal and breath sounds normal.  Abdominal: Soft. Bowel sounds are normal.  Neurological: [name] alert and oriented to person, place, and time. She has normal reflexes.  Positive rhomberg  Skin: Skin is warm and dry. No rash noted.  Psychiatric: She has a normal mood and affect. Her behavior is normal. Judgment and thought content normal.  Nursing note and vitals reviewed.       Assessment & Plan:   1. Vertigo - meclizine (ANTIVERT) 25 MG tablet; Take 1 tablet (25 mg total) by mouth 3 (three) times daily as needed for dizziness.  Dispense: 40 tablet; Refill: 0   Current Outpatient Prescriptions:  .  clonazePAM (KLONOPIN) 0.5 MG tablet, TAKE 1 TABLET BY MOUTH TWICE DAILY AS NEEDED, Disp: 60 tablet, Rfl: 1 .  cyclobenzaprine (FLEXERIL) 10 MG tablet, TAKE 1 TABLET BY MOUTH THREE TIMES DAILY, Disp: 30 tablet, Rfl: 1 .  fexofenadine (ALLEGRA) 180 MG tablet, Take 180 mg by mouth daily., Disp: , Rfl:  .  fluticasone (FLONASE) 50 MCG/ACT nasal spray, Place 2 sprays into both nostrils daily., Disp: , Rfl:  .  levothyroxine (SYNTHROID, LEVOTHROID) 112 MCG tablet, Take 1 tablet (112 mcg total) by mouth daily before breakfast., Disp: 30 tablet, Rfl: 5 .  metoCLOPramide (REGLAN) 10 MG tablet, Take 1 tablet (10 mg total) by mouth daily., Disp: 30 tablet, Rfl: 5 .  oxybutynin (DITROPAN-XL) 5 MG 24 hr tablet, , Disp: , Rfl:  .  pantoprazole (PROTONIX) 40 MG tablet, Take 1 tablet (40 mg total) by mouth 2 (two) times daily. 30 min prior to breakfast, Disp: 60 tablet, Rfl: 1 .  propranolol (INDERAL) 80 MG tablet, Take 1 tablet (80 mg total) by mouth daily., Disp: 30 tablet, Rfl: 5 .  ranitidine (ZANTAC) 150 MG tablet, Take 1 tablet (150 mg total) by mouth daily., Disp: 30 tablet, Rfl: 5 .  meclizine (ANTIVERT) 25 MG tablet, Take 1 tablet (25 mg total) by mouth 3 (three) times daily as needed for dizziness., Disp: 40  tablet, Rfl: 0  Continue all other maintenance medications as listed above.  Follow up plan: Return if symptoms worsen or fail to improve.  Educational handout given for vertigo  Terald Sleeper PA-C [facility] [address]  [place],  [address] [phone]   07/26/2016, 8:46 AM

## 2016-07-30 DIAGNOSIS — Z0289 Encounter for other administrative examinations: Secondary | ICD-10-CM

## 2016-08-05 ENCOUNTER — Other Ambulatory Visit: Payer: Self-pay | Admitting: Nurse Practitioner

## 2016-08-05 DIAGNOSIS — K219 Gastro-esophageal reflux disease without esophagitis: Secondary | ICD-10-CM

## 2016-08-08 DIAGNOSIS — G4733 Obstructive sleep apnea (adult) (pediatric): Secondary | ICD-10-CM | POA: Diagnosis not present

## 2016-08-08 NOTE — Telephone Encounter (Signed)
Forward to MMM/PCP

## 2016-08-09 DIAGNOSIS — G4733 Obstructive sleep apnea (adult) (pediatric): Secondary | ICD-10-CM | POA: Diagnosis not present

## 2016-08-16 ENCOUNTER — Other Ambulatory Visit: Payer: Self-pay | Admitting: Nurse Practitioner

## 2016-08-31 ENCOUNTER — Encounter: Payer: Self-pay | Admitting: *Deleted

## 2016-08-31 ENCOUNTER — Telehealth: Payer: Self-pay | Admitting: Pulmonary Disease

## 2016-08-31 NOTE — Telephone Encounter (Signed)
Pt returned phone call..ert ° ° °

## 2016-08-31 NOTE — Telephone Encounter (Signed)
Patient returning call - pt would like call back on her work number at [phone] as she doesn't get cell phone signal at work - she also gets off of work at 3:30pm and can be reached then -pr

## 2016-08-31 NOTE — Telephone Encounter (Signed)
Sounds like she will need to leave the cpap off if can't tolerate it and stay off back or even sleep in a recliner until comes in for f/u

## 2016-08-31 NOTE — Telephone Encounter (Signed)
Called and spoke with pt and she stated that she has been using her cpap every night.  She stated that she has had this for almost 2 yrs.  [name] using the nasal pillows and recently she has been having issues with her cpap and she has been to [facility] about her machine.  She stated that her nasal pillows are getting wet at night, the water is building up in the tube and causing her to get choked at night, giving her a headache,sinus issues,  cough and waking her up at night. This has been going on for several months. She has bought new tubing but this did not help either. She stated that she has turned her humidity down from 4 to 2 and now it is drying her out but still getting the water in her nose.  Wanting to know any recs that she can try.  She has pending appt with VS on 6/26.  Please advise since VS is not in the office. Thanks

## 2016-08-31 NOTE — Telephone Encounter (Signed)
LMOM TCB x2 Pt is also active in MyChart, will send message to see if we can get pt taken care of

## 2016-08-31 NOTE — Telephone Encounter (Signed)
Spoke with pt, aware of recs.  Nothing further needed at this time.  

## 2016-08-31 NOTE — Telephone Encounter (Signed)
lmomtcb x 1 for the pt 

## 2016-09-04 ENCOUNTER — Ambulatory Visit (INDEPENDENT_AMBULATORY_CARE_PROVIDER_SITE_OTHER): Payer: 59 | Admitting: Pulmonary Disease

## 2016-09-04 ENCOUNTER — Encounter: Payer: Self-pay | Admitting: Pulmonary Disease

## 2016-09-04 VITALS — BP 116/74 | HR 88 | Ht 66.0 in | Wt 255.6 lb

## 2016-09-04 DIAGNOSIS — G4733 Obstructive sleep apnea (adult) (pediatric): Secondary | ICD-10-CM | POA: Diagnosis not present

## 2016-09-04 DIAGNOSIS — Z6841 Body Mass Index (BMI) 40.0 and over, adult: Secondary | ICD-10-CM | POA: Diagnosis not present

## 2016-09-04 NOTE — Patient Instructions (Signed)
Try insulating your CPAP tube and putting your machine lower than your bed  Follow up in 1 year

## 2016-09-04 NOTE — Progress Notes (Signed)
Current Outpatient Prescriptions on File Prior to Visit  Medication Sig  . clonazePAM (KLONOPIN) 0.5 MG tablet TAKE ONE TABLET BY MOUTH TWICE DAILY AS NEEDED  . cyclobenzaprine (FLEXERIL) 10 MG tablet TAKE 1 TABLET BY MOUTH THREE TIMES DAILY  . fexofenadine (ALLEGRA) 180 MG tablet Take 180 mg by mouth daily.  . fluticasone (FLONASE) 50 MCG/ACT nasal spray Place 2 sprays into both nostrils daily.  Marland Kitchen levothyroxine (SYNTHROID, LEVOTHROID) 112 MCG tablet Take 1 tablet (112 mcg total) by mouth daily before breakfast.  . meclizine (ANTIVERT) 25 MG tablet Take 1 tablet (25 mg total) by mouth 3 (three) times daily as needed for dizziness.  . metoCLOPramide (REGLAN) 10 MG tablet Take 1 tablet (10 mg total) by mouth daily.  Marland Kitchen oxybutynin (DITROPAN-XL) 5 MG 24 hr tablet   . pantoprazole (PROTONIX) 40 MG tablet TAKE ONE TABLET BY MOUTH TWICE DAILY - 30 MINUTES PRIOR TO BREAKFAST  . pantoprazole (PROTONIX) 40 MG tablet TAKE 1 TABLET BY MOUTH TWICE DAILY , 30 MINUTES PRIOR TO BREAKFAST ( PT WILL NEED TO BE SEEN FOR ANY FURTHER REFILLS )  . propranolol (INDERAL) 80 MG tablet Take 1 tablet (80 mg total) by mouth daily.  . ranitidine (ZANTAC) 150 MG tablet Take 1 tablet (150 mg total) by mouth daily.   No current facility-administered medications on file prior to visit.      Chief Complaint  Patient presents with  . Follow-up    pt has follow up for OSA and CPAP DL. Pt states that water is coming in the mask at night and cannot sleep. Been to the [facility] in [place] regarding the CPAP machine, mask fitting, and tried other options nothing is helping this issue. Pt is sleeping better, using the machine each night daily more than 7 hours each night. Pt is stuffed up today with allergies and sinus issues.      Sleep tests HST 03/02/15 >> AHI 92.2, SaO2 low 47%. Auto CPAP 08/10/16 to 09/02/16 >> used on 24 of 24 nights with average 9 hrs 37 min.  Average AHI 0.5 with median CPAP 11 and 95 th  percentile CPAP 14 cm H2O  Past medical history Hypothyroidism, Anxiety, Palpitations, HA, HLD, Allergies, GERD  Past surgical history, Family history, Social history, Allergies reviewed  Vital Signs BP 116/74 (BP Location: Left Arm, Cuff Size: Normal)   Pulse 88   Ht 5\' 6"  (1.676 m)   Wt 255 lb 9.6 oz (115.9 kg)   SpO2 96%   BMI 41.25 kg/m   History of Present Illness [name] [name] is a 46 y.o. female with obstructive sleep apnea.  [name] getting water build up in her mask.  She has tried changing humidifier setting.  Her mask fits well otherwise, and no issue with pressure setting.  She can't sleep w/o CPAP.  [name] adjusting her diet and walking more.   Physical Exam  General - pleasant Eyes - pupils reactive ENT - no sinus tenderness, no oral exudate, no LAN, MP 4 Cardiac - regular, no murmur Chest - no wheeze, rales Abd - soft, non tender Ext - no edema Skin - no rashes Neuro - normal strength Psych - normal mood  Assessment/Plan  Obstructive sleep apnea. - she is compliant with CPAP and reports benefit - continue auto CPAP - discussed techniques to avoid rain out  Obesity. - discussed options to assist with weight loss   Patient Instructions  Try insulating your CPAP tube and putting your machine  lower than your bed  Follow up in 1 year    Chesley Mires, MD [facility] Pager:  [phone] 09/04/2016, 4:40 PM

## 2016-09-08 DIAGNOSIS — G4733 Obstructive sleep apnea (adult) (pediatric): Secondary | ICD-10-CM | POA: Diagnosis not present

## 2016-09-24 DIAGNOSIS — S39012A Strain of muscle, fascia and tendon of lower back, initial encounter: Secondary | ICD-10-CM | POA: Diagnosis not present

## 2016-09-24 DIAGNOSIS — G441 Vascular headache, not elsewhere classified: Secondary | ICD-10-CM | POA: Diagnosis not present

## 2016-09-24 DIAGNOSIS — M546 Pain in thoracic spine: Secondary | ICD-10-CM | POA: Diagnosis not present

## 2016-09-25 DIAGNOSIS — S39012A Strain of muscle, fascia and tendon of lower back, initial encounter: Secondary | ICD-10-CM | POA: Diagnosis not present

## 2016-09-25 DIAGNOSIS — M546 Pain in thoracic spine: Secondary | ICD-10-CM | POA: Diagnosis not present

## 2016-09-25 DIAGNOSIS — G441 Vascular headache, not elsewhere classified: Secondary | ICD-10-CM | POA: Diagnosis not present

## 2016-09-26 DIAGNOSIS — G441 Vascular headache, not elsewhere classified: Secondary | ICD-10-CM | POA: Diagnosis not present

## 2016-09-26 DIAGNOSIS — M546 Pain in thoracic spine: Secondary | ICD-10-CM | POA: Diagnosis not present

## 2016-09-26 DIAGNOSIS — S39012A Strain of muscle, fascia and tendon of lower back, initial encounter: Secondary | ICD-10-CM | POA: Diagnosis not present

## 2016-09-28 DIAGNOSIS — M546 Pain in thoracic spine: Secondary | ICD-10-CM | POA: Diagnosis not present

## 2016-09-28 DIAGNOSIS — S39012A Strain of muscle, fascia and tendon of lower back, initial encounter: Secondary | ICD-10-CM | POA: Diagnosis not present

## 2016-09-28 DIAGNOSIS — G441 Vascular headache, not elsewhere classified: Secondary | ICD-10-CM | POA: Diagnosis not present

## 2016-10-01 ENCOUNTER — Ambulatory Visit (INDEPENDENT_AMBULATORY_CARE_PROVIDER_SITE_OTHER): Payer: 59 | Admitting: Family

## 2016-10-01 ENCOUNTER — Encounter: Payer: Self-pay | Admitting: Family

## 2016-10-01 VITALS — BP 118/74 | HR 72 | Temp 97.3°F | Ht 66.0 in | Wt 254.4 lb

## 2016-10-01 DIAGNOSIS — L0291 Cutaneous abscess, unspecified: Secondary | ICD-10-CM | POA: Diagnosis not present

## 2016-10-01 DIAGNOSIS — M545 Low back pain, unspecified: Secondary | ICD-10-CM

## 2016-10-01 DIAGNOSIS — L732 Hidradenitis suppurativa: Secondary | ICD-10-CM

## 2016-10-01 MED ORDER — CLINDAMYCIN PHOSPHATE 1 % EX SOLN: Freq: Two times a day (BID) | CUTANEOUS | 2 refills | Status: DC

## 2016-10-01 MED ORDER — PREDNISONE 10 MG (21) PO TBPK: ORAL_TABLET | ORAL | 0 refills | Status: DC

## 2016-10-01 MED ORDER — CYCLOBENZAPRINE HCL 10 MG PO TABS: 10.0000 mg | ORAL_TABLET | Freq: Three times a day (TID) | ORAL | 1 refills | Status: DC

## 2016-10-01 MED ORDER — DOXYCYCLINE HYCLATE 100 MG PO TABS: 100.0000 mg | ORAL_TABLET | Freq: Two times a day (BID) | ORAL | 0 refills | Status: DC

## 2016-10-01 NOTE — Progress Notes (Signed)
   Subjective:    Patient ID: [name] [name], female    DOB: 1971/01/05, 46 y.o.   MRN: [id number]  Pt presents to the office today with abscess on gluteal cleft that she noticed Thursday. PT has tried soaking in hot bath with epsom salt and hot compression and motrin with no relief. PT states the pain constant throbbing pain of 8 out 10 that is worse if she has to sit or has pressure on it.  Back Pain  This is a recurrent problem. The current episode started 1 to 4 weeks ago. The problem occurs intermittently. The pain is present in the lumbar spine. The quality of the pain is described as aching. The pain is moderate. She has tried ice, NSAIDs and bed rest for the symptoms. The treatment provided mild relief.     Review of Systems  Musculoskeletal: Positive for back pain.  All other systems reviewed and are negative.      Objective:   Physical Exam  Constitutional: [name] oriented to person, place, and time. She appears well-developed and well-nourished. No distress.  HENT:  Head: Normocephalic and atraumatic.  Right Ear: External ear normal.  Mouth/Throat: Oropharynx is clear and moist.  Eyes: Pupils are equal, round, and reactive to light.  Neck: Normal range of motion. Neck supple. No thyromegaly present.  Cardiovascular: Normal rate, regular rhythm, normal heart sounds and intact distal pulses.   No murmur heard. Pulmonary/Chest: Effort normal and breath sounds normal. No respiratory distress. She has no wheezes.  Abdominal: Soft. Bowel sounds are normal. She exhibits no distension. There is no tenderness.  Musculoskeletal: Normal range of motion. She exhibits no edema or tenderness.  Negative SLR, tenderness in lower back with bending   Neurological: [name] alert and oriented to person, place, and time.  Skin: Skin is warm and dry. Rash noted. Rash is pustular.  Multiple abscess under left axillary-hard, tender, and erythemas , right axillary, groin- purulent white discharge, and  left gluteal cleft-Hard, erythemas, very tender  Psychiatric: She has a normal mood and affect. Her behavior is normal. Judgment and thought content normal.  Vitals reviewed.    BP 118/74   Pulse 72   Temp (!) 97.3 F (36.3 C) (Oral)   Ht 5\' 6"  (1.676 m)   Wt 254 lb 6.4 oz (115.4 kg)   BMI 41.06 kg/m      Assessment & Plan:  1. Hidradenitis suppurativa - doxycycline (VIBRA-TABS) 100 MG tablet; Take 1 tablet (100 mg total) by mouth 2 (two) times daily.  Dispense: 20 tablet; Refill: 0 - clindamycin (CLEOCIN-T) 1 % external solution; Apply topically 2 (two) times daily.  Dispense: 60 mL; Refill: 2  2. Abscess Warm compresses Continue soaking in hot water Do not pick or squeeze  Get chlorhexidine OTC and use daily - doxycycline (VIBRA-TABS) 100 MG tablet; Take 1 tablet (100 mg total) by mouth 2 (two) times daily.  Dispense: 20 tablet; Refill: 0 - clindamycin (CLEOCIN-T) 1 % external solution; Apply topically 2 (two) times daily.  Dispense: 60 mL; Refill: 2  3. Acute bilateral low back pain without sciatica Rest Ice and heat ROM exercises discussed - predniSONE (STERAPRED UNI-PAK 21 TAB) 10 MG (21) TBPK tablet; Use as directed  Dispense: 21 tablet; Refill: 0 - cyclobenzaprine (FLEXERIL) 10 MG tablet; Take 1 tablet (10 mg total) by mouth 3 (three) times daily.  Dispense: 60 tablet; Refill: [place], FNP

## 2016-10-01 NOTE — Patient Instructions (Signed)
Hidradenitis Suppurativa Hidradenitis suppurativa is a long-term (chronic) skin disease that starts with blocked sweat glands or hair follicles. Bacteria may grow in these blocked openings of your skin. Hidradenitis suppurativa is like a severe form of acne that develops in areas of your body where acne would be unusual. It is most likely to affect the areas of your body where skin rubs against skin and becomes moist. This includes your:  Underarms.  Groin.  Genital areas.  Buttocks.  Upper thighs.  Breasts.  Hidradenitis suppurativa may start out with small pimples. The pimples can develop into deep sores that break open (rupture) and drain pus. Over time your skin may thicken and become scarred. Hidradenitis suppurativa cannot be passed from person to person. What are the causes? The exact cause of hidradenitis suppurativa is not known. This condition may be due to:  Female and female hormones. The condition is rare before and after puberty.  An overactive body defense system (immune system). Your immune system may overreact to the blocked hair follicles or sweat glands and cause swelling and pus-filled sores.  What increases the risk? You may have a higher risk of hidradenitis suppurativa if you:  Are a woman.  Are between ages 11 and 55.  Have a family history of hidradenitis suppurativa.  Have a personal history of acne.  Are overweight.  Smoke.  Take the drug lithium.  What are the signs or symptoms? The first signs of an outbreak are usually painful skin bumps that look like pimples. As the condition progresses:  Skin bumps may get bigger and grow deeper into the skin.  Bumps under the skin may rupture and drain smelly pus.  Skin may become itchy and infected.  Skin may thicken and scar.  Drainage may continue through tunnels under the skin (fistulas).  Walking and moving your arms can become painful.  How is this diagnosed? Your health care provider may  diagnose hidradenitis suppurativa based on your medical history and your signs and symptoms. A physical exam will also be done. You may need to see a health care provider who specializes in skin diseases (dermatologist). You may also have tests done to confirm the diagnosis. These can include:  Swabbing a sample of pus or drainage from your skin so it can be sent to the lab and tested for infection.  Blood tests to check for infection.  How is this treated? The same treatment will not work for everybody with hidradenitis suppurativa. Your treatment will depend on how severe your symptoms are. You may need to try several treatments to find what works best for you. Part of your treatment may include cleaning and bandaging (dressing) your wounds. You may also have to take medicines, such as the following:  Antibiotics.  Acne medicines.  Medicines to block or suppress the immune system.  A diabetes medicine (metformin) is sometimes used to treat this condition.  For women, birth control pills can sometimes help relieve symptoms.  You may need surgery if you have a severe case of hidradenitis suppurativa that does not respond to medicine. Surgery may involve:  Using a laser to clear the skin and remove hair follicles.  Opening and draining deep sores.  Removing the areas of skin that are diseased and scarred.  Follow these instructions at home:  Learn as much as you can about your disease, and work closely with your health care providers.  Take medicines only as directed by your health care provider.  If you were prescribed   an antibiotic medicine, finish it all even if you start to feel better.  If you are overweight, losing weight may be very helpful. Try to reach and maintain a healthy weight.  Do not use any tobacco products, including cigarettes, chewing tobacco, or electronic cigarettes. If you need help quitting, ask your health care provider.  Do not shave the areas where you  get hidradenitis suppurativa.  Do not wear deodorant.  Wear loose-fitting clothes.  Try not to overheat and get sweaty.  Take a daily bleach bath as directed by your health care provider. ? Fill your bathtub halfway with water. ? Pour in  cup of unscented household bleach. ? Soak for 5-10 minutes.  Cover sore areas with a warm, clean washcloth (compress) for 5-10 minutes. Contact a health care provider if:  You have a flare-up of hidradenitis suppurativa.  You have chills or a fever.  You are having trouble controlling your symptoms at home. This information is not intended to replace advice given to you by your health care provider. Make sure you discuss any questions you have with your health care provider. Document Released: 10/11/2003 Document Revised: 08/04/2015 Document Reviewed: 05/29/2013 Elsevier Interactive Patient Education  2018 [employer]  

## 2016-10-03 ENCOUNTER — Other Ambulatory Visit: Payer: Self-pay | Admitting: Nurse Practitioner

## 2016-10-03 DIAGNOSIS — E039 Hypothyroidism, unspecified: Secondary | ICD-10-CM

## 2016-10-08 ENCOUNTER — Telehealth: Payer: Self-pay | Admitting: Nurse Practitioner

## 2016-10-09 DIAGNOSIS — G4733 Obstructive sleep apnea (adult) (pediatric): Secondary | ICD-10-CM | POA: Diagnosis not present

## 2016-10-09 NOTE — Telephone Encounter (Signed)
Last office note 10-01-16 faxed to [facility] [phone].

## 2016-10-10 DIAGNOSIS — S39012A Strain of muscle, fascia and tendon of lower back, initial encounter: Secondary | ICD-10-CM | POA: Diagnosis not present

## 2016-10-10 DIAGNOSIS — M546 Pain in thoracic spine: Secondary | ICD-10-CM | POA: Diagnosis not present

## 2016-10-10 DIAGNOSIS — G441 Vascular headache, not elsewhere classified: Secondary | ICD-10-CM | POA: Diagnosis not present

## 2016-10-11 ENCOUNTER — Ambulatory Visit (INDEPENDENT_AMBULATORY_CARE_PROVIDER_SITE_OTHER): Payer: 59 | Admitting: Family

## 2016-10-11 ENCOUNTER — Encounter: Payer: Self-pay | Admitting: Family

## 2016-10-11 VITALS — BP 105/75 | HR 77 | Temp 98.0°F | Ht 66.0 in | Wt 256.0 lb

## 2016-10-11 DIAGNOSIS — M5441 Lumbago with sciatica, right side: Secondary | ICD-10-CM

## 2016-10-11 MED ORDER — KETOROLAC TROMETHAMINE 60 MG/2ML IM SOLN
60.0000 mg | Freq: Once | INTRAMUSCULAR | Status: AC
  Administered 2016-10-11: 60 mg via INTRAMUSCULAR

## 2016-10-11 MED ORDER — METHYLPREDNISOLONE ACETATE 80 MG/ML IJ SUSP
80.0000 mg | Freq: Once | INTRAMUSCULAR | Status: AC
  Administered 2016-10-11: 80 mg via INTRAMUSCULAR

## 2016-10-11 MED ORDER — NAPROXEN 500 MG PO TABS: 500.0000 mg | ORAL_TABLET | Freq: Two times a day (BID) | ORAL | 1 refills | Status: DC

## 2016-10-11 NOTE — Patient Instructions (Signed)
Spondylolisthesis Rehab  Ask your health care provider which exercises are safe for you. Do exercises exactly as told by your health care provider and adjust them as directed. It is normal to feel mild stretching, pulling, tightness, or discomfort as you do these exercises, but you should stop right away if you feel sudden pain or your pain gets worse. Do not begin these exercises until told by your health care provider.  Stretching and range of motion exercises  These exercises warm up your muscles and joints and improve the movement and flexibility of your hips and your back. These exercises may also help to relieve pain, numbness, and tingling.  Exercise A: Single knee to chest    1. Lie on your back on a firm surface with both legs straight.  2. Bend one of your knees. Use your hands to move your knee up toward your chest until you feel a gentle stretch in your lower back and buttock.  ? Hold your leg in this position by holding onto the front of your knee.  ? Keep your other leg as straight as possible.  3. Hold for __________ seconds.  4. Slowly return to the starting position.  5. Repeat this exercise with your other leg.  Repeat __________ times. Complete this exercise __________ times a day.  Exercise B: Double knee to chest    1. Lie on your back on a firm surface with both legs straight.  2. Bend one of your knees and move it toward your chest until you feel a gentle stretch in your lower back and buttock.  3. Tense your abdominal muscles and repeat the previous step with your other leg.  4. Hold both of your legs in this position by holding onto the backs of your thighs or the fronts of your knees.  5. Hold for __________ seconds.  6. Tense your abdominal muscles and slowly move your legs back to the floor, one leg at a time.  Repeat __________ times. Complete this exercise __________ times a day.  Strengthening exercises  These exercises build strength and endurance in your back. Endurance is the  ability to use your muscles for a long time, even after they get tired.  Exercise C: Pelvic tilt  1. Lie on your back on a firm bed or the floor. Bend your knees and keep your feet flat.  2. Tense your abdominal muscles. Tip your pelvis up toward the ceiling and flatten your lower back into the floor.  ? To help with this exercise, you may place a small towel under your lower back and try to push your back into the towel.  3. Hold for __________ seconds.  4. Let your muscles relax completely before you repeat this exercise.  Repeat __________ times. Complete this exercise __________ times a day.  Exercise D: Abdominal crunch    1. Lie on your back on a firm surface. Bend your knees and keep your feet flat. Cross your arms over your chest.  2. Tuck your chin down toward your chest, without bending your neck.  3. Use your abdominal muscles to lift your upper body off of the ground, straight up into the air.  ? Try to lift yourself until your shoulder blades are off the ground. You may need to work up to this.  ? Keep your lower back on the ground while you crunch upward.  ? Do not hold your breath.  4. Slowly lower yourself down. Keep your abdominal muscles tense until   you are back to the starting position.  Repeat __________ times. Complete this exercise __________ times a day.  Exercise E: Alternating arm and leg raises    1. Get on your hands and knees on a firm surface. If you are on a hard floor, you may want to use padding to cushion your knees, such as an exercise mat.  2. Line up your arms and legs. Your hands should be below your shoulders, and your knees should be below your hips.  3. Lift your left leg behind you. At the same time, raise your right arm and straighten it in front of you.  ? Do not lift your leg higher than your hip.  ? Do not lift your arm higher than your shoulder.  ? Keep your abdominal and back muscles tight.  ? Keep your hips facing the ground.  ? Do not arch your back.  ? Keep your  balance carefully, and do not hold your breath.  4. Hold for __________ seconds.  5. Slowly return to the starting position and repeat with your right leg and your left arm.  Repeat __________ times. Complete this exercise __________ times a day.  Posture and body mechanics    Body mechanics refers to the movements and positions of your body while you do your daily activities. Posture is part of body mechanics. Good posture and healthy body mechanics can help to relieve stress in your body's tissues and joints. Good posture means that your spine is in its natural S-curve position (your spine is neutral), your shoulders are pulled back slightly, and your head is not tipped forward. The following are general guidelines for applying improved posture and body mechanics to your everyday activities.  Standing     When standing, keep your spine neutral and your feet about hip-width apart. Keep a slight bend in your knees. Your ears, shoulders, and hips should line up.   When you do a task in which you stand in one place for a long time, place one foot up on a stable object that is 2-4 inches (5-10 cm) high, such as a footstool. This helps keep your spine neutral.  Sitting     When sitting, keep your spine neutral and keep your feet flat on the floor. Use a footrest, if necessary, and keep your thighs parallel to the floor. Avoid rounding your shoulders, and avoid tilting your head forward.   When working at a desk or a computer, keep your desk at a height where your hands are slightly lower than your elbows. Slide your chair under your desk so you are close enough to maintain good posture.   When working at a computer, place your monitor at a height where you are looking straight ahead and you do not have to tilt your head forward or downward to look at the screen.  Resting    When lying down and resting, avoid positions that are most painful for you.   If you have pain with activities such as sitting, bending,  stooping, or squatting (flexion-based activities), lie in a position in which your body does not bend very much. For example, avoid curling up on your side with your arms and knees near your chest (fetal position).   If you have pain with activities such as standing for a long time or reaching with your arms (extension-based activities), lie with your spine in a neutral position and bend your knees slightly. Try the following positions:  ? Lying on   your side with a pillow between your knees.  ? Lying on your back with a pillow under your knees.    Lifting     When lifting objects, keep your feet at least shoulder-width apart and tighten your abdominal muscles.   Bend your knees and hips and keep your spine neutral. It is important to lift using the strength of your legs, not your back. Do not lock your knees straight out.   Always ask for help to lift heavy or awkward objects.  This information is not intended to replace advice given to you by your health care provider. Make sure you discuss any questions you have with your health care provider.  Document Released: 02/26/2005 Document Revised: 11/03/2015 Document Reviewed: 12/07/2014  Elsevier Interactive Patient Education  2018 [employer]

## 2016-10-11 NOTE — Progress Notes (Signed)
   Subjective:    Patient ID: [name] [name], female    DOB: Jul 25, 1970, 46 y.o.   MRN: [id number]  Back Pain  This is a recurrent problem. The current episode started more than 1 month ago. The problem occurs constantly. The problem has been gradually worsening since onset. The pain is present in the lumbar spine. The quality of the pain is described as aching. The pain radiates to the right thigh. The pain is at a severity of 8/10. The pain is moderate. The symptoms are aggravated by bending and standing. Associated symptoms include leg pain and tingling. Pertinent negatives include no bladder incontinence, bowel incontinence or dysuria. Risk factors include sedentary lifestyle and obesity. She has tried bed rest, muscle relaxant and NSAIDs for the symptoms. The treatment provided mild relief.      Review of Systems  Gastrointestinal: Negative for bowel incontinence.  Genitourinary: Negative for bladder incontinence and dysuria.  Musculoskeletal: Positive for back pain.  Neurological: Positive for tingling.  All other systems reviewed and are negative.      Objective:   Physical Exam  Constitutional: [name] oriented to person, place, and time. She appears well-developed and well-nourished. No distress.  HENT:  Head: Normocephalic.  Eyes: Pupils are equal, round, and reactive to light.  Cardiovascular: Normal rate, regular rhythm, normal heart sounds and intact distal pulses.   No murmur heard. Pulmonary/Chest: Effort normal and breath sounds normal. No respiratory distress. She has no wheezes.  Abdominal: Soft. Bowel sounds are normal. She exhibits no distension. There is no tenderness.  Musculoskeletal: Normal range of motion. She exhibits tenderness. She exhibits no edema.  Full ROM of back, tenderness in lower back with flexion, extension, negative SLR  Neurological: [name] alert and oriented to person, place, and time.  Skin: Skin is warm and dry.  Psychiatric: She has a normal  mood and affect. Her behavior is normal. Judgment and thought content normal.  Vitals reviewed.    BP 105/75   Pulse 77   Temp 98 F (36.7 C) (Oral)   Ht 5\' 6"  (1.676 m)   Wt 256 lb (116.1 kg)   BMI 41.32 kg/m      Assessment & Plan:  1. Acute midline low back pain with right-sided sciatica Rest Ice and heat as needed ROM exercises encouraged- Handout given Keep Chiropractor appt RTO prn  - methylPREDNISolone acetate (DEPO-MEDROL) injection 80 mg; Inject 1 mL (80 mg total) into the muscle once. - ketorolac (TORADOL) injection 60 mg; Inject 2 mLs (60 mg total) into the muscle once. - naproxen (NAPROSYN) 500 MG tablet; Take 1 tablet (500 mg total) by mouth 2 (two) times daily with a meal.  Dispense: 60 tablet; Refill: [place], FNP

## 2016-10-17 DIAGNOSIS — G441 Vascular headache, not elsewhere classified: Secondary | ICD-10-CM | POA: Diagnosis not present

## 2016-10-17 DIAGNOSIS — S39012A Strain of muscle, fascia and tendon of lower back, initial encounter: Secondary | ICD-10-CM | POA: Diagnosis not present

## 2016-10-17 DIAGNOSIS — M546 Pain in thoracic spine: Secondary | ICD-10-CM | POA: Diagnosis not present

## 2016-10-22 ENCOUNTER — Other Ambulatory Visit: Payer: Self-pay | Admitting: Family

## 2016-10-22 DIAGNOSIS — M545 Low back pain, unspecified: Secondary | ICD-10-CM

## 2016-11-01 ENCOUNTER — Other Ambulatory Visit: Payer: Self-pay | Admitting: Nurse Practitioner

## 2016-11-01 NOTE — Telephone Encounter (Signed)
Please call in klonopin with 2 refills

## 2016-11-02 NOTE — Telephone Encounter (Signed)
Called to [facility].

## 2016-11-09 DIAGNOSIS — G4733 Obstructive sleep apnea (adult) (pediatric): Secondary | ICD-10-CM | POA: Diagnosis not present

## 2016-11-27 ENCOUNTER — Ambulatory Visit: Payer: 59 | Admitting: Nurse Practitioner

## 2016-11-30 ENCOUNTER — Other Ambulatory Visit: Payer: Self-pay | Admitting: Nurse Practitioner

## 2016-11-30 DIAGNOSIS — G43901 Migraine, unspecified, not intractable, with status migrainosus: Secondary | ICD-10-CM

## 2016-11-30 DIAGNOSIS — K219 Gastro-esophageal reflux disease without esophagitis: Secondary | ICD-10-CM

## 2016-12-09 DIAGNOSIS — G4733 Obstructive sleep apnea (adult) (pediatric): Secondary | ICD-10-CM | POA: Diagnosis not present

## 2016-12-11 ENCOUNTER — Other Ambulatory Visit: Payer: Self-pay | Admitting: Nurse Practitioner

## 2016-12-11 NOTE — Telephone Encounter (Signed)
Last seen 10/11/16 Alyse Low MMM PCP  If approved route to [place]  [phone]

## 2016-12-11 NOTE — Telephone Encounter (Signed)
Rx called in to pharmacy. 

## 2016-12-18 DIAGNOSIS — Z01419 Encounter for gynecological examination (general) (routine) without abnormal findings: Secondary | ICD-10-CM | POA: Diagnosis not present

## 2017-01-07 ENCOUNTER — Encounter: Payer: Self-pay | Admitting: Internal Medicine

## 2017-01-08 ENCOUNTER — Ambulatory Visit: Payer: 59 | Admitting: Nurse Practitioner

## 2017-01-08 DIAGNOSIS — G4733 Obstructive sleep apnea (adult) (pediatric): Secondary | ICD-10-CM | POA: Diagnosis not present

## 2017-01-09 DIAGNOSIS — G4733 Obstructive sleep apnea (adult) (pediatric): Secondary | ICD-10-CM | POA: Diagnosis not present

## 2017-01-10 DIAGNOSIS — Z1231 Encounter for screening mammogram for malignant neoplasm of breast: Secondary | ICD-10-CM | POA: Diagnosis not present

## 2017-01-21 ENCOUNTER — Ambulatory Visit (INDEPENDENT_AMBULATORY_CARE_PROVIDER_SITE_OTHER): Payer: 59 | Admitting: Otolaryngology

## 2017-01-21 DIAGNOSIS — H6123 Impacted cerumen, bilateral: Secondary | ICD-10-CM | POA: Diagnosis not present

## 2017-01-21 DIAGNOSIS — H900 Conductive hearing loss, bilateral: Secondary | ICD-10-CM | POA: Diagnosis not present

## 2017-01-31 ENCOUNTER — Other Ambulatory Visit: Payer: Self-pay | Admitting: Nurse Practitioner

## 2017-01-31 DIAGNOSIS — K219 Gastro-esophageal reflux disease without esophagitis: Secondary | ICD-10-CM

## 2017-02-08 DIAGNOSIS — G4733 Obstructive sleep apnea (adult) (pediatric): Secondary | ICD-10-CM | POA: Diagnosis not present

## 2017-02-11 ENCOUNTER — Encounter: Payer: Self-pay | Admitting: Family Medicine

## 2017-02-11 ENCOUNTER — Other Ambulatory Visit: Payer: Self-pay | Admitting: Family

## 2017-02-11 ENCOUNTER — Ambulatory Visit: Payer: 59 | Admitting: Family Medicine

## 2017-02-11 VITALS — BP 108/65 | HR 86 | Temp 99.2°F | Ht 66.0 in | Wt 256.0 lb

## 2017-02-11 DIAGNOSIS — J0100 Acute maxillary sinusitis, unspecified: Secondary | ICD-10-CM | POA: Diagnosis not present

## 2017-02-11 DIAGNOSIS — M545 Low back pain, unspecified: Secondary | ICD-10-CM

## 2017-02-11 DIAGNOSIS — J029 Acute pharyngitis, unspecified: Secondary | ICD-10-CM

## 2017-02-11 LAB — CULTURE, GROUP A STREP

## 2017-02-11 LAB — RAPID STREP SCREEN (MED CTR MEBANE ONLY): STREP GP A AG, IA W/REFLEX: NEGATIVE

## 2017-02-11 MED ORDER — AMOXICILLIN-POT CLAVULANATE 875-125 MG PO TABS: 1.0000 | ORAL_TABLET | Freq: Two times a day (BID) | ORAL | 0 refills | Status: DC

## 2017-02-11 MED ORDER — FLUCONAZOLE 150 MG PO TABS: 150.0000 mg | ORAL_TABLET | Freq: Once | ORAL | 0 refills | Status: AC

## 2017-02-11 NOTE — Patient Instructions (Signed)
I have sent you in a prescription for Augmentin for you to take by mouth twice a day for the next 10 days.  As we discussed, the most common side effect of this medication is diarrhea.  The free to use a probiotic of your choice or yogurt if needed.  I have also sent in Diflucan to use once by mouth if needed for yeast infection.  If your symptoms persist or worsen, please seek reevaluation.  - Get plenty of rest and drink plenty of fluids. - Try to breathe moist air. Use a cold mist humidifier. - Consume warm fluids (soup or tea) to provide relief for a stuffy nose and to loosen phlegm. - For nasal stuffiness, try saline nasal spray or a Neti Pot. Afrin nasal spray can also be used but this product should not be used longer than 3 days or it will cause rebound nasal stuffiness (worsening nasal congestion). - For sore throat pain relief: suck on throat lozenges, hard candy or popsicles; gargle with warm salt water (1/4 tsp. salt per 8 oz. of water); and eat soft, bland foods. - Eat a well-balanced diet. If you cannot, ensure you are getting enough nutrients by taking a daily multivitamin. - Avoid dairy products, as they can thicken phlegm. - Avoid alcohol, as it impairs your body's immune system.  CONTACT YOUR DOCTOR IF YOU EXPERIENCE ANY OF THE FOLLOWING: - High fever - Ear pain - Sinus-type headache - Unusually severe cold symptoms - Cough that gets worse while other cold symptoms improve - Flare up of any chronic lung problem, such as asthma - Your symptoms persist longer than 2 weeks

## 2017-02-11 NOTE — Progress Notes (Signed)
Subjective: CC: sore throat HPI: [name] [name] is a 46 y.o. female presenting to clinic today for:  1. Sore throat  Patient reports sore throat, frontal headache, sinus drainage, dry cough that started Thursday.  She notes that the right side of her face hurts.  She reports dental pain.  She has multiple sick contacts.  Denies hemoptysis, SOB, dizziness, rash, nausea, vomiting, diarrhea, fevers, chills, myalgia, recent travel.  Patient has used Allegra-D, Flonase and ibuprofen with minimal relief of symptoms.  Denies history of COPD or asthma.  Denies tobacco use/ exposure.  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  For this reason, she came in today for evaluation.  ROS: Per HPI  No Known Allergies  Past Medical History:  Diagnosis Date  . Allergy    SEASONAL  . Anxiety   . Chronic headaches   . Colon polyps   . GERD (gastroesophageal reflux disease)   . Hyperlipidemia   . Hypothyroidism   . Palpitations     Current Outpatient Medications:  .  clindamycin (CLEOCIN-T) 1 % external solution, Apply topically 2 (two) times daily., Disp: 60 mL, Rfl: 2 .  clonazePAM (KLONOPIN) 0.5 MG tablet, TAKE ONE TABLET BY MOUTH TWICE DAILY AS NEEDED, Disp: 60 tablet, Rfl: 2 .  cyclobenzaprine (FLEXERIL) 10 MG tablet, TAKE ONE TABLET BY MOUTH THREE TIMES DAILY, Disp: 60 tablet, Rfl: 0 .  doxycycline (VIBRA-TABS) 100 MG tablet, Take 1 tablet (100 mg total) by mouth 2 (two) times daily., Disp: 20 tablet, Rfl: 0 .  fexofenadine (ALLEGRA) 180 MG tablet, Take 180 mg by mouth daily., Disp: , Rfl:  .  fluticasone (FLONASE) 50 MCG/ACT nasal spray, Place 2 sprays into both nostrils daily., Disp: , Rfl:  .  levothyroxine (SYNTHROID, LEVOTHROID) 112 MCG tablet, TAKE 1 TABLET BY MOUTH EVERY DAY BEFORE BREAKFAST, Disp: 30 tablet, Rfl: 7 .  meclizine (ANTIVERT) 25 MG tablet, Take 1 tablet (25 mg total) by mouth 3 (three) times  daily as needed for dizziness., Disp: 40 tablet, Rfl: 0 .  medroxyPROGESTERone (DEPO-PROVERA) 150 MG/ML injection, INJECT 1 ML INTRAMUSCULAR EVERY 90 DAYS, Disp: , Rfl: 3 .  metoCLOPramide (REGLAN) 10 MG tablet, TAKE 1 TABLET BY MOUTH DAILY, Disp: 30 tablet, Rfl: 4 .  naproxen (NAPROSYN) 500 MG tablet, Take 1 tablet (500 mg total) by mouth 2 (two) times daily with a meal., Disp: 60 tablet, Rfl: 1 .  oxybutynin (DITROPAN-XL) 5 MG 24 hr tablet, , Disp: , Rfl:  .  pantoprazole (PROTONIX) 40 MG tablet, TAKE ONE TABLET BY MOUTH TWICE DAILY - 30 MINUTES PRIOR TO BREAKFAST, Disp: 60 tablet, Rfl: 5 .  pantoprazole (PROTONIX) 40 MG tablet, TAKE 1 TABLET BY MOUTH TWICE DAILY, 30 MINUTES PRIOR TO BREAKFAST (PT WILL NEED TO BE SEEN FOR ANY FURTHER REFILLS), Disp: 60 tablet, Rfl: 1 .  propranolol (INDERAL) 80 MG tablet, TAKE 1 TABLET BY MOUTH DAILY, Disp: 30 tablet, Rfl: 4 .  ranitidine (ZANTAC) 150 MG tablet, TAKE 1 TABLET BY MOUTH DAILY, Disp: 30 tablet, Rfl: 4  Social History   Socioeconomic History  . Marital status: Divorced    Spouse name: Not on file  . Number of children: 1  . Years of education: Not on file  . Highest education level: Not on file  Social Needs  . Financial resource strain: Not on file  . Food insecurity - worry: Not on file  . Food insecurity -  inability: Not on file  . Transportation needs - medical: Not on file  . Transportation needs - non-medical: Not on file  Occupational History    Employer: [employer]  Tobacco Use  . Smoking status: Former Smoker    Packs/day: 0.50    Years: 7.00    Pack years: 3.50    Types: Cigarettes    Start date: 04/21/2008    Last attempt to quit: 08/29/2015    Years since quitting: 1.4  . Smokeless tobacco: Current User  . Tobacco comment: patient has patches but has not began yet, has program through her employer  Substance and Sexual Activity  . Alcohol use: Yes    Alcohol/week: 0.0 oz    Comment: rare  . Drug use: No  .  Sexual activity: Not on file  Other Topics Concern  . Not on file  Social History Narrative  . Not on file   Objective: Office vital signs reviewed. BP 108/65   Pulse 86   Temp 99.2 F (37.3 C) (Oral)   Ht 5\' 6"  (1.676 m)   Wt 256 lb (116.1 kg)   BMI 41.32 kg/m   Physical Examination:  General: Awake, alert, well nourished, nontoxic, No acute distress HEENT: Normal    Neck: No masses palpated. No lymphadenopathy; no nuchal rigidity    Ears: Tympanic membranes intact, normal light reflex, no erythema, no bulging    Eyes: PERRLA, extraocular movement in tact, sclera white, no ocular discharge    Nose: nasal turbinates moist, opaque nasal discharge appreciated on the left    Throat: moist mucus membranes, mild oropharyngeal erythema, no tonsillar exudate.  Airway is patent Cardio: regular rate and rhythm, S1S2 heard, no murmurs appreciated Pulm: clear to auscultation bilaterally, no wheezes, rhonchi or rales; normal work of breathing on room air GI: soft, non-tender, non-distended, bowel sounds present x4, no hepatomegaly, no splenomegaly, no masses  Assessment/ Plan: 46 y.o. female   1. Acute non-recurrent maxillary sinusitis Patient is a low-grade fever here to 99.2 F.  Signs and symptoms suggestive of acute sinusitis.  She does appear to have opaque nasal discharge, particularly appreciated on the left.  Will treat as an acute bacterial sinusitis with Augmentin twice daily for 10 days.  Diflucan 150 mg p.o. x1 as needed yeast infection was also sent into the pharmacy.  Home care instructions were reviewed with the patient.  She voiced good understanding.  Work note provided excusing for the next several days.  Strict return precautions and reasons for emergent evaluation in the emergency department review with patient.  She voiced understanding and will follow-up as needed.  2. Sore throat Negative rapid strep. - Rapid Strep Screen (Not at [facility])   Orders Placed This Encounter   Procedures  . Rapid Strep Screen (Not at [facility])   Meds ordered this encounter  Medications  . amoxicillin-clavulanate (AUGMENTIN) 875-125 MG tablet    Sig: Take 1 tablet by mouth 2 (two) times daily.    Dispense:  20 tablet    Refill:  0  . fluconazole (DIFLUCAN) 150 MG tablet    Sig: Take 1 tablet (150 mg total) by mouth once for 1 dose.    Dispense:  1 tablet    Refill:  [place], DO [place] [id number]

## 2017-02-15 DIAGNOSIS — Z0289 Encounter for other administrative examinations: Secondary | ICD-10-CM

## 2017-02-20 ENCOUNTER — Encounter: Payer: Self-pay | Admitting: Family Medicine

## 2017-02-20 ENCOUNTER — Ambulatory Visit: Payer: 59 | Admitting: Family Medicine

## 2017-02-20 VITALS — BP 136/81 | HR 103 | Temp 97.4°F | Ht 66.0 in | Wt 259.0 lb

## 2017-02-20 DIAGNOSIS — R42 Dizziness and giddiness: Secondary | ICD-10-CM | POA: Diagnosis not present

## 2017-02-20 DIAGNOSIS — E039 Hypothyroidism, unspecified: Secondary | ICD-10-CM | POA: Diagnosis not present

## 2017-02-20 MED ORDER — MECLIZINE HCL 25 MG PO TABS: 25.0000 mg | ORAL_TABLET | Freq: Three times a day (TID) | ORAL | 0 refills | Status: DC | PRN

## 2017-02-20 MED ORDER — METHYLPREDNISOLONE ACETATE 80 MG/ML IJ SUSP
80.0000 mg | Freq: Once | INTRAMUSCULAR | Status: AC
  Administered 2017-02-20: 80 mg via INTRAMUSCULAR

## 2017-02-20 MED ORDER — ONDANSETRON HCL 8 MG PO TABS: 8.0000 mg | ORAL_TABLET | Freq: Three times a day (TID) | ORAL | 0 refills | Status: DC | PRN

## 2017-02-20 NOTE — Progress Notes (Signed)
Subjective:  Patient ID: [name] [name], female    DOB: 10/07/1970  Age: 46 y.o. MRN: [id number]  CC: Dizziness (pt here today c/o "room spinning" nausea and feels like vertigo that she had in May)   HPI [name] presents for awakenin yesterday with a sensation of the room spinning that has persisted until now. It is intermittent. It is accompanied by nausea. There has been no injury. No earache. She has nausea and her head is pounding over the crown, bilaterally. It is worsened by sudden movement of the head. She notes sinus congestion last week.   Depression screen [facility] 2/9 02/20/2017 02/11/2017 10/11/2016  Decreased Interest 0 0 0  Down, Depressed, Hopeless 0 0 0  PHQ - 2 Score 0 0 0  Altered sleeping - - -  Tired, decreased energy - - -  Change in appetite - - -  Feeling bad or failure about yourself  - - -  Trouble concentrating - - -  Moving slowly or fidgety/restless - - -  Suicidal thoughts - - -  PHQ-9 Score - - -    History [name] has a past medical history of Allergy, Anxiety, Chronic headaches, Colon polyps, GERD (gastroesophageal reflux disease), Hyperlipidemia, Hypothyroidism, and Palpitations.   She has a past surgical history that includes Cesarean section; Colonoscopy; and Lithotripsy (12/2014).   Her family history includes Breast cancer in her paternal grandmother; Heart disease in her maternal grandfather and maternal grandmother; Hypertension in her mother; Lung cancer in her father.She reports that she quit smoking about 17 months ago. Her smoking use included cigarettes. She started smoking about 8 years ago. She has a 3.50 pack-year smoking history. She uses smokeless tobacco. She reports that she drinks alcohol. She reports that she does not use drugs.    ROS Review of Systems  Constitutional: Negative for activity change, appetite change and fever.  HENT: Negative for congestion, rhinorrhea and sore throat.   Eyes: Negative for visual disturbance.    Respiratory: Negative for cough and shortness of breath.   Cardiovascular: Negative for chest pain and palpitations.  Gastrointestinal: Negative for abdominal pain, diarrhea and nausea.  Genitourinary: Negative for dysuria.  Musculoskeletal: Negative for arthralgias and myalgias.  Neurological: Positive for dizziness.    Objective:  BP 136/81   Pulse (!) 103   Temp (!) 97.4 F (36.3 C) (Oral)   Ht 5\' 6"  (1.676 m)   Wt 259 lb (117.5 kg)   BMI 41.80 kg/m   BP Readings from Last 3 Encounters:  02/20/17 136/81  02/11/17 108/65  10/11/16 105/75    Wt Readings from Last 3 Encounters:  02/20/17 259 lb (117.5 kg)  02/11/17 256 lb (116.1 kg)  10/11/16 256 lb (116.1 kg)     Physical Exam  Constitutional: [name] oriented to person, place, and time. She appears well-developed and well-nourished. No distress.  HENT:  Head: Normocephalic and atraumatic.  Right Ear: External ear normal.  Left Ear: External ear normal.  Nose: Nose normal.  Mouth/Throat: Oropharynx is clear and moist.  Eyes: Conjunctivae and EOM are normal. Pupils are equal, round, and reactive to light.  Neck: Normal range of motion. Neck supple. No thyromegaly present.  Cardiovascular: Normal rate, regular rhythm and normal heart sounds.  No murmur heard. Pulmonary/Chest: Effort normal and breath sounds normal. No respiratory distress. She has no wheezes. She has no rales.  Abdominal: Soft. Bowel sounds are normal. She exhibits no distension. There is no tenderness.  Lymphadenopathy:    She  has no cervical adenopathy.  Neurological: [name] alert and oriented to person, place, and time. She has normal reflexes.  Skin: Skin is warm and dry.  Psychiatric: She has a normal mood and affect. Her behavior is normal. Judgment and thought content normal.      Assessment & Plan:   [name] was seen today for dizziness.  Diagnoses and all orders for this visit:  Hypothyroidism, unspecified type  Vertigo -      meclizine (ANTIVERT) 25 MG tablet; Take 1 tablet (25 mg total) by mouth 3 (three) times daily as needed for dizziness. -     methylPREDNISolone acetate (DEPO-MEDROL) injection 80 mg  Other orders -     ondansetron (ZOFRAN) 8 MG tablet; Take 1 tablet (8 mg total) by mouth every 8 (eight) hours as needed for nausea or vomiting.       I have discontinued Maicie Derusha's doxycycline and amoxicillin-clavulanate. I am also having her start on ondansetron. Additionally, I am having her maintain her fexofenadine, oxybutynin, fluticasone, pantoprazole, clindamycin, levothyroxine, medroxyPROGESTERone, naproxen, metoCLOPramide, ranitidine, propranolol, clonazePAM, cyclobenzaprine, and meclizine. We administered methylPREDNISolone acetate.  Allergies as of 02/20/2017   No Known Allergies     Medication List        Accurate as of 02/20/17 11:59 PM. Always use your most recent med list.          clindamycin 1 % external solution Commonly known as:  CLEOCIN-T Apply topically 2 (two) times daily.   clonazePAM 0.5 MG tablet Commonly known as:  KLONOPIN TAKE ONE TABLET BY MOUTH TWICE DAILY AS NEEDED   cyclobenzaprine 10 MG tablet Commonly known as:  FLEXERIL TAKE ONE TABLET BY MOUTH THREE TIMES DAILY   fexofenadine 180 MG tablet Commonly known as:  ALLEGRA Take 180 mg by mouth daily.   FLONASE 50 MCG/ACT nasal spray Generic drug:  fluticasone Place 2 sprays into both nostrils daily.   levothyroxine 112 MCG tablet Commonly known as:  SYNTHROID, LEVOTHROID TAKE 1 TABLET BY MOUTH EVERY DAY BEFORE BREAKFAST   meclizine 25 MG tablet Commonly known as:  ANTIVERT Take 1 tablet (25 mg total) by mouth 3 (three) times daily as needed for dizziness.   medroxyPROGESTERone 150 MG/ML injection Commonly known as:  DEPO-PROVERA INJECT 1 ML INTRAMUSCULAR EVERY 90 DAYS   metoCLOPramide 10 MG tablet Commonly known as:  REGLAN TAKE 1 TABLET BY MOUTH DAILY   naproxen 500 MG tablet Commonly known  as:  NAPROSYN Take 1 tablet (500 mg total) by mouth 2 (two) times daily with a meal.   ondansetron 8 MG tablet Commonly known as:  ZOFRAN Take 1 tablet (8 mg total) by mouth every 8 (eight) hours as needed for nausea or vomiting.   oxybutynin 5 MG 24 hr tablet Commonly known as:  DITROPAN-XL   pantoprazole 40 MG tablet Commonly known as:  PROTONIX TAKE ONE TABLET BY MOUTH TWICE DAILY - 30 MINUTES PRIOR TO BREAKFAST   propranolol 80 MG tablet Commonly known as:  INDERAL TAKE 1 TABLET BY MOUTH DAILY   ranitidine 150 MG tablet Commonly known as:  ZANTAC TAKE 1 TABLET BY MOUTH DAILY        Follow-up: Return in about 2 weeks (around 03/06/2017), or if symptoms worsen or fail to improve.  Claretta Fraise, M.D.

## 2017-02-24 ENCOUNTER — Encounter: Payer: Self-pay | Admitting: Family Medicine

## 2017-03-02 ENCOUNTER — Other Ambulatory Visit: Payer: Self-pay | Admitting: Physician Assistant

## 2017-03-06 NOTE — Telephone Encounter (Signed)
Last seen 02/20/17  MMM  IF approved route to nurse to call into [facility]   [phone]

## 2017-03-13 ENCOUNTER — Other Ambulatory Visit: Payer: Self-pay | Admitting: Nurse Practitioner

## 2017-03-13 DIAGNOSIS — M545 Low back pain, unspecified: Secondary | ICD-10-CM

## 2017-03-14 ENCOUNTER — Other Ambulatory Visit: Payer: Self-pay

## 2017-03-14 ENCOUNTER — Ambulatory Visit (AMBULATORY_SURGERY_CENTER): Payer: Self-pay | Admitting: *Deleted

## 2017-03-14 VITALS — Ht 66.0 in | Wt 257.0 lb

## 2017-03-14 DIAGNOSIS — Z1211 Encounter for screening for malignant neoplasm of colon: Secondary | ICD-10-CM

## 2017-03-14 MED ORDER — NA SULFATE-K SULFATE-MG SULF 17.5-3.13-1.6 GM/177ML PO SOLN: 1.0000 | Freq: Once | ORAL | 0 refills | Status: AC

## 2017-03-14 NOTE — Progress Notes (Signed)
Denies allergies to eggs or soy products. Denies complications with sedation or anesthesia. Denies O2 use. Denies use of diet or weight loss medications.  Emmi instructions given for colonoscopy.  

## 2017-03-23 IMAGING — RF DG ESOPHAGUS
14 series · 14 of 14 positions shown · non-contrast
Comparison: None.

CLINICAL DATA: Reflux, choking, evaluate for esophageal stricture

EXAM:
ESOPHOGRAM / BARIUM SWALLOW / BARIUM TABLET STUDY
TECHNIQUE: Combined double contrast and single contrast examination performed
using effervescent crystals, thick barium liquid, and thin barium
liquid. The patient was observed with fluoroscopy swallowing a 13 mm
barium sulphate tablet.
FLUOROSCOPY TIME:  Fluoroscopy Time:  1 minutes 28 seconds
Number of Acquired Images:  11

[Series 1: run · 1 of 1 slices shown (1 of 14)]
[im 1/1]
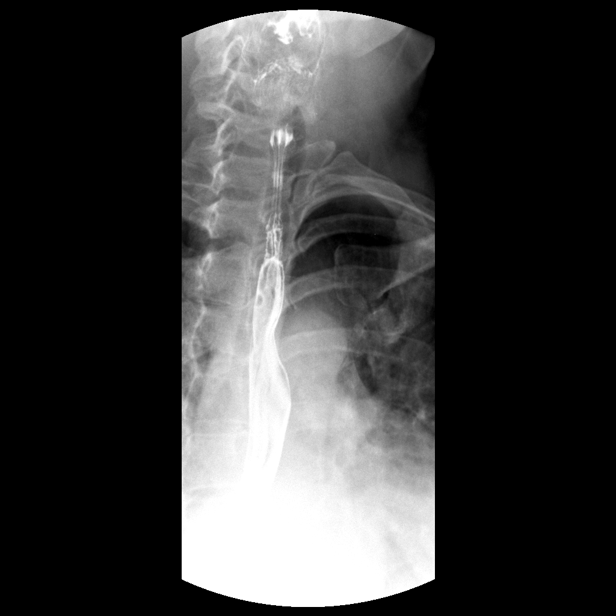

[Series 2: run · 1 of 1 slices shown (2 of 14)]
[im 1/1]
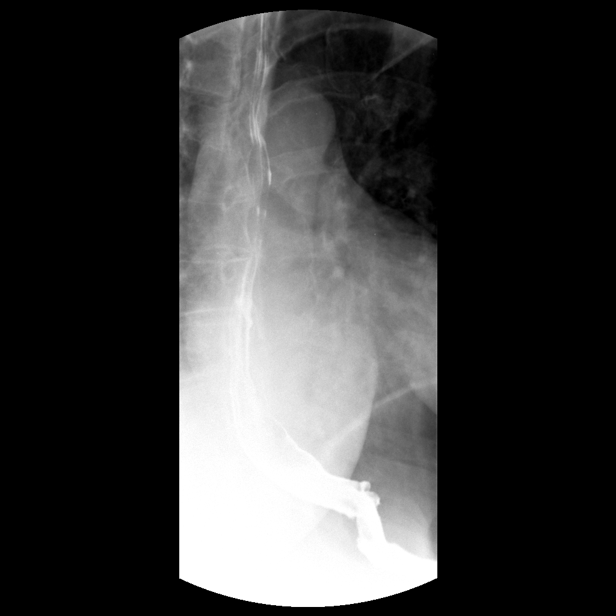

[Series 3: run · 1 of 1 slices shown (3 of 14)]
[im 1/1]
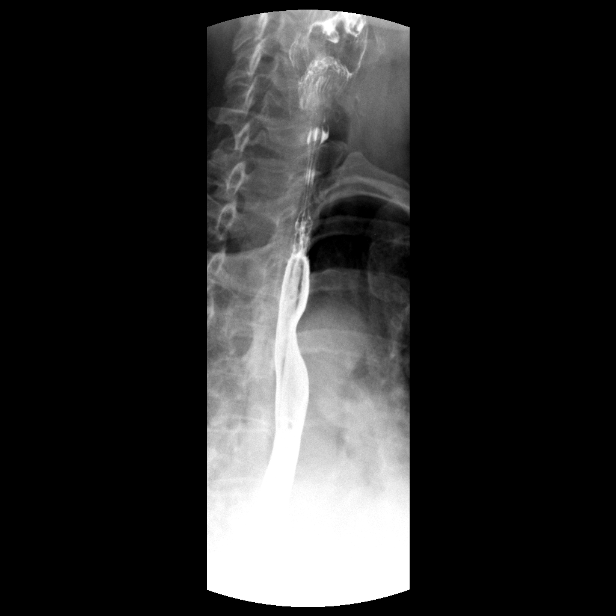

[Series 4: run · 1 of 1 slices shown (4 of 14)]
[im 1/1]
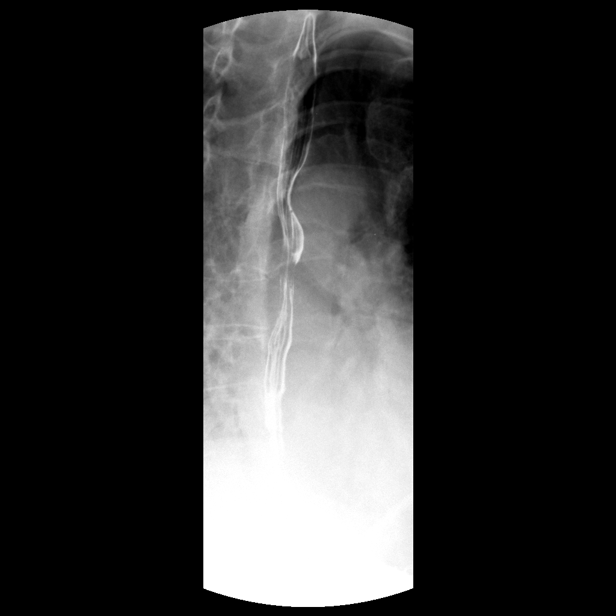

[Series 5: run · 1 of 1 slices shown (5 of 14)]
[im 1/1]
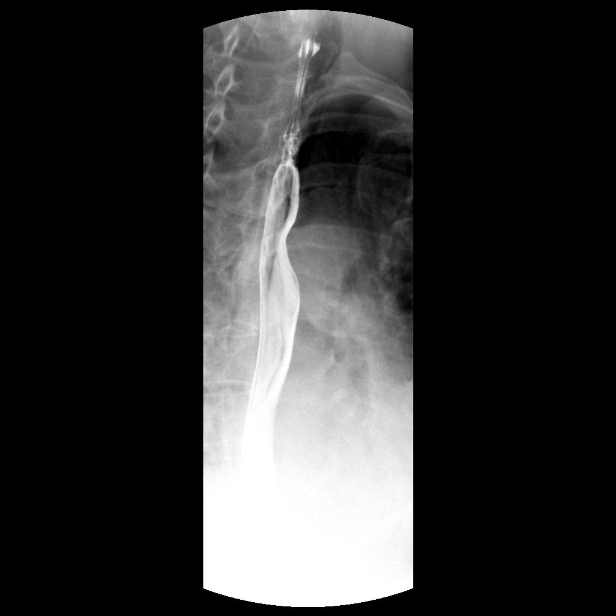

[Series 6: run · 1 of 1 slices shown (6 of 14)]
[im 1/1]
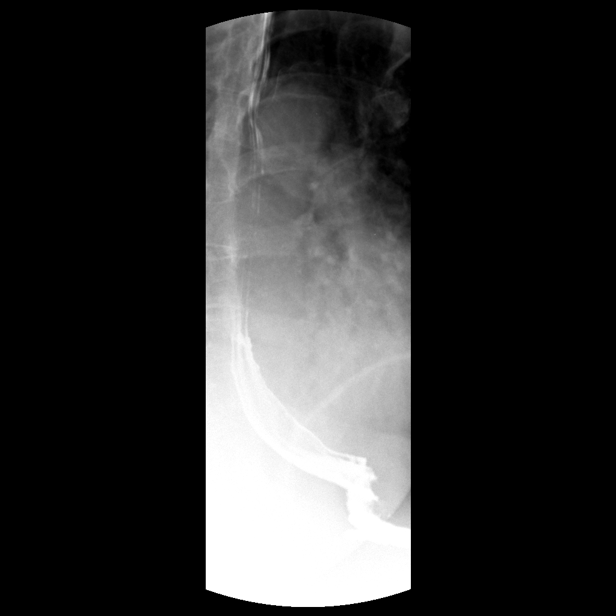

[Series 7: run · 1 of 1 slices shown (7 of 14)]
[im 1/1]
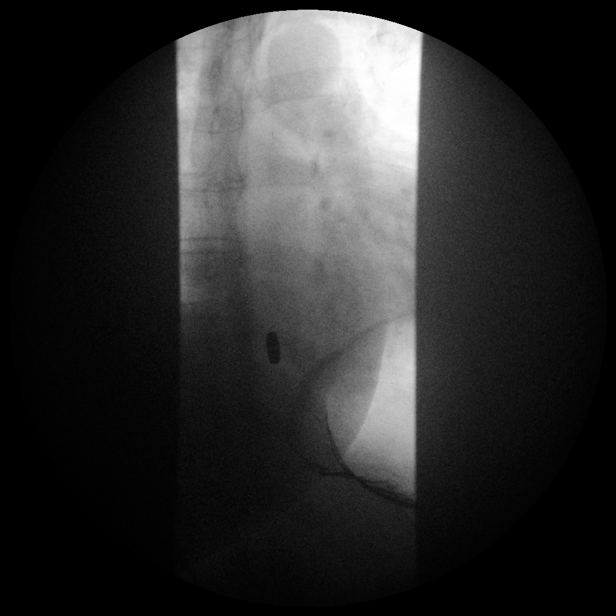

[Series 8: run · 1 of 1 slices shown (8 of 14)]
[im 1/1]
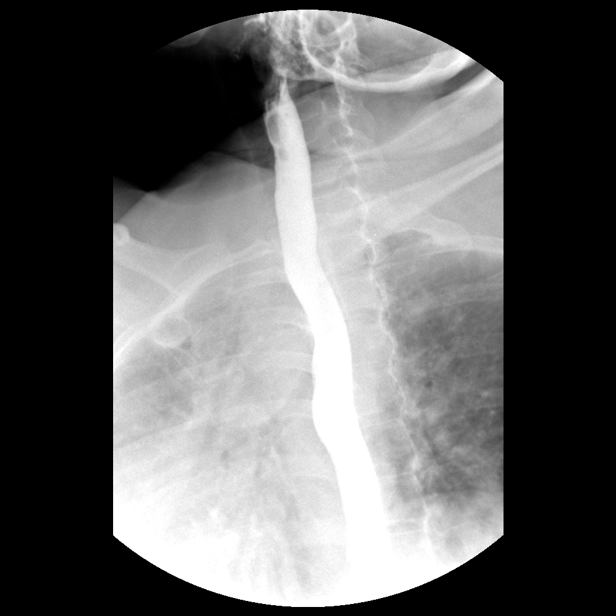

[Series 9: run · 1 of 1 slices shown (9 of 14)]
[im 1/1]
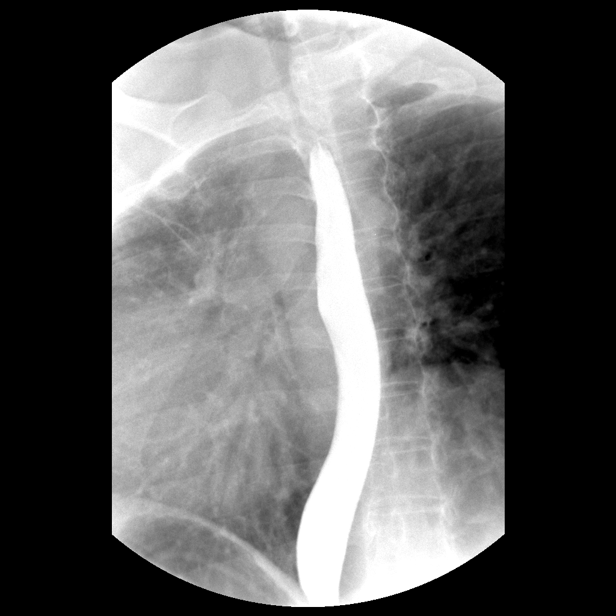

[Series 10: run · 1 of 1 slices shown (10 of 14)]
[im 1/1]
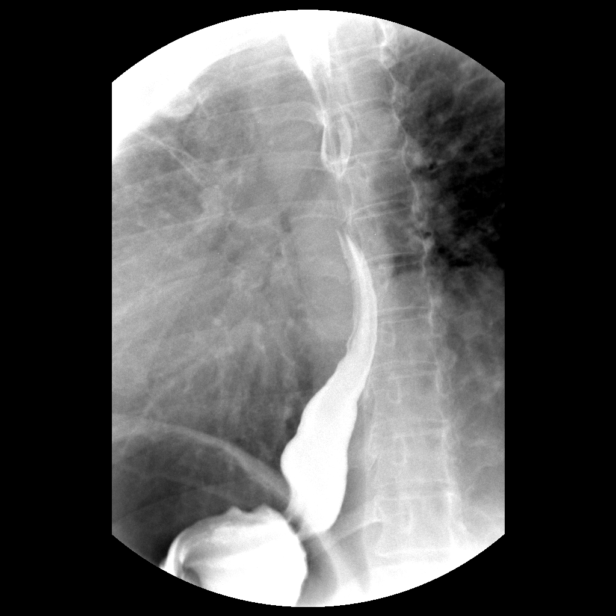

[Series 11: run · 1 of 1 slices shown (11 of 14)]
[im 1/1]
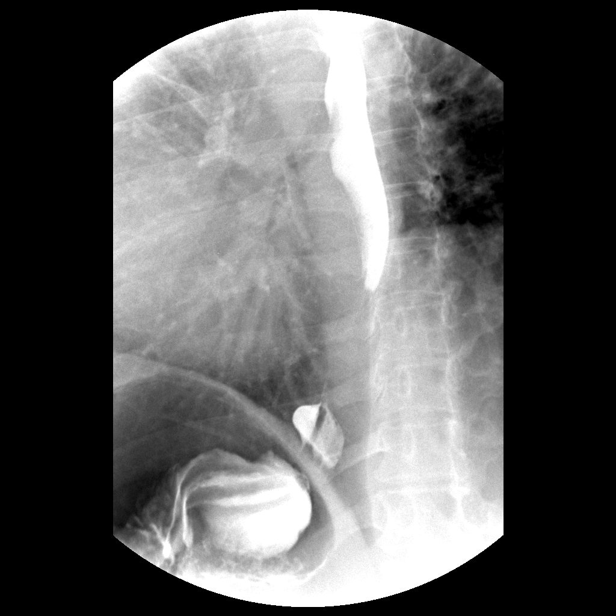

[Series 12: run · 1 of 1 slices shown (12 of 14)]
[im 1/1]
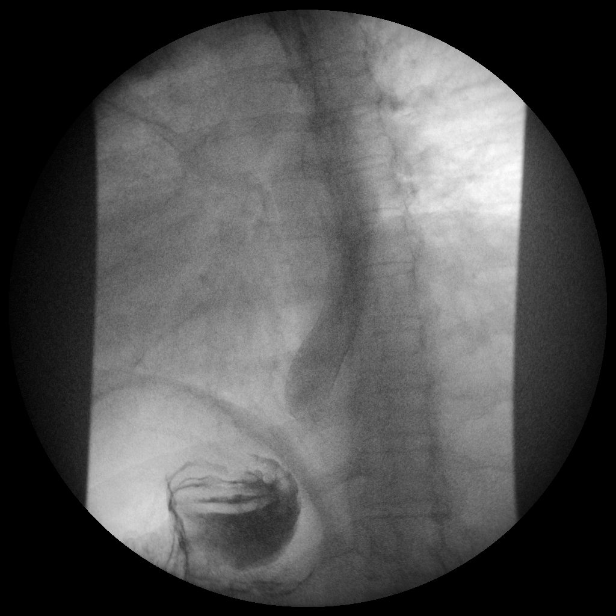

[Series 13: run · 1 of 1 slices shown (13 of 14)]
[im 1/1]
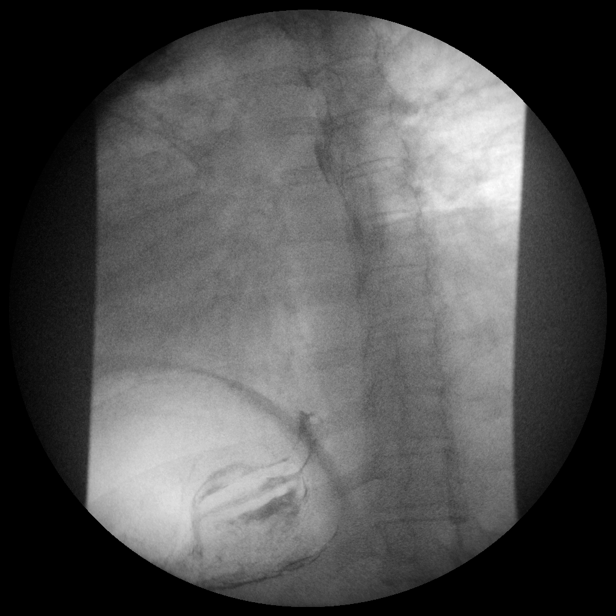

[Series 14: run · 1 of 1 slices shown (14 of 14)]
[im 1/1]
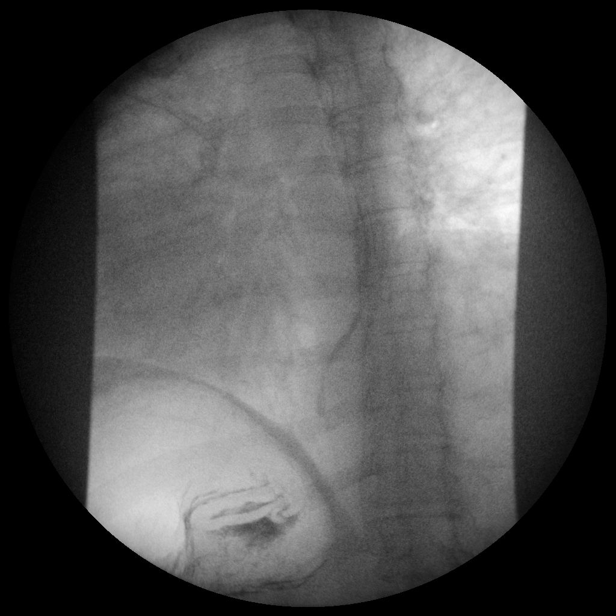

[14 of 14 positions shown; findings below may reference images not displayed]

FINDINGS: Essentially normal esophageal peristalsis. Intermittent esophageal
dysmotility/tertiary contractions on a single swallow is likely
within normal limits.

Small hiatal hernia in the prone position.  Mild esophageal reflux.

No evidence of esophageal stricture. A 13 mm barium tablet passed
into the stomach with only transient delay.
IMPRESSION: No evidence of esophageal stricture.

Small sliding hiatal hernia with mild esophageal reflux.

## 2017-03-28 ENCOUNTER — Ambulatory Visit (INDEPENDENT_AMBULATORY_CARE_PROVIDER_SITE_OTHER): Payer: 59 | Admitting: Otolaryngology

## 2017-03-28 ENCOUNTER — Encounter: Payer: 59 | Admitting: Internal Medicine

## 2017-03-28 DIAGNOSIS — H9012 Conductive hearing loss, unilateral, left ear, with unrestricted hearing on the contralateral side: Secondary | ICD-10-CM | POA: Diagnosis not present

## 2017-03-28 DIAGNOSIS — H6982 Other specified disorders of Eustachian tube, left ear: Secondary | ICD-10-CM | POA: Diagnosis not present

## 2017-03-28 DIAGNOSIS — J310 Chronic rhinitis: Secondary | ICD-10-CM | POA: Diagnosis not present

## 2017-04-02 ENCOUNTER — Other Ambulatory Visit: Payer: Self-pay | Admitting: Nurse Practitioner

## 2017-04-02 DIAGNOSIS — K219 Gastro-esophageal reflux disease without esophagitis: Secondary | ICD-10-CM

## 2017-04-04 ENCOUNTER — Encounter: Payer: Self-pay | Admitting: Internal Medicine

## 2017-04-09 ENCOUNTER — Telehealth: Payer: Self-pay

## 2017-04-09 ENCOUNTER — Ambulatory Visit: Payer: 59 | Admitting: Family Medicine

## 2017-04-09 ENCOUNTER — Encounter: Payer: Self-pay | Admitting: Family Medicine

## 2017-04-09 VITALS — BP 106/65 | HR 62 | Temp 98.8°F | Ht 66.0 in | Wt 258.0 lb

## 2017-04-09 DIAGNOSIS — J40 Bronchitis, not specified as acute or chronic: Secondary | ICD-10-CM | POA: Diagnosis not present

## 2017-04-09 DIAGNOSIS — J329 Chronic sinusitis, unspecified: Secondary | ICD-10-CM | POA: Diagnosis not present

## 2017-04-09 MED ORDER — PSEUDOEPHEDRINE-GUAIFENESIN ER 120-1200 MG PO TB12: 1.0000 | ORAL_TABLET | Freq: Two times a day (BID) | ORAL | 0 refills | Status: DC

## 2017-04-09 MED ORDER — AMOXICILLIN-POT CLAVULANATE 875-125 MG PO TABS: 1.0000 | ORAL_TABLET | Freq: Two times a day (BID) | ORAL | 0 refills | Status: DC

## 2017-04-09 MED ORDER — BETAMETHASONE SOD PHOS & ACET 6 (3-3) MG/ML IJ SUSP
6.0000 mg | Freq: Once | INTRAMUSCULAR | Status: AC
  Administered 2017-04-09: 6 mg via INTRAMUSCULAR

## 2017-04-09 MED ORDER — MOMETASONE FUROATE 50 MCG/ACT NA SUSP: 2.0000 | Freq: Every day | NASAL | 12 refills | Status: DC

## 2017-04-09 NOTE — Telephone Encounter (Signed)
Give pt. My apology for the oversight. It has been sent just now. Thanks, WS

## 2017-04-09 NOTE — Progress Notes (Addendum)
Chief Complaint  Patient presents with  . Cough    pt here today c/o cough, congestion, sneezing and "just all stopped up" since Saturday night.    HPI  Patient presents today for Patient presents with upper respiratory congestion. Rhinorrhea that is frequently purulent. There is moderate sore throat. Patient reports coughing frequently as well.  Yellow sputum noted. The patient denies being short of breath. Onset was 3 days ago. Gradually worsening. Tried OTCs without improvement. Took ibuprofen last night at bedtime and she woke up in a sweat during the night indicating that she probably had some fever that broke.  Additionally she takes Allegra and Flonase every night based on ENT order.  She has been prone to ear infections.  [name] having a great deal of burning in her nose every time she sprays the Flonase.  PMH: Smoking status noted ROS: Per HPI  Objective: BP 106/65   Pulse 62   Temp 98.8 F (37.1 C) (Oral)   Ht 5\' 6"  (1.676 m)   Wt 258 lb (117 kg)   BMI 41.64 kg/m  Gen: NAD, alert, cooperative with exam HEENT: NCAT, Nasal passages swollen, red TMS clear CV: RRR, good S1/S2, no murmur Resp: Bronchitis changes with scattered wheezes, non-labored Ext: No edema, warm Neuro: Alert and oriented, No gross deficits  Assessment and plan:  1. Sinobronchitis     Meds ordered this encounter  Medications  . amoxicillin-clavulanate (AUGMENTIN) 875-125 MG tablet    Sig: Take 1 tablet by mouth 2 (two) times daily. Take all of this medication    Dispense:  20 tablet    Refill:  0  . Pseudoephedrine-Guaifenesin [phone] MG TB12    Sig: Take 1 tablet by mouth 2 (two) times daily. For congestion    Dispense:  20 each    Refill:  0  . betamethasone acetate-betamethasone sodium phosphate (CELESTONE) injection 6 mg  . mometasone (NASONEX) 50 MCG/ACT nasal spray    Sig: Place 2 sprays into the nose daily.    Dispense:  17 g    Refill:  12    No orders of the defined types were  placed in this encounter.   Follow up as needed.  Claretta Fraise, MD

## 2017-04-09 NOTE — Telephone Encounter (Signed)
Patient came to pick up medications prescribed today.  She thought you were sending in Nasonex for her since the Flonase causes burning in her nose.  Please advise.

## 2017-04-09 NOTE — Addendum Note (Signed)
Addended by: Claretta Fraise on: 04/09/2017 02:21 PM   Modules accepted: Orders

## 2017-04-09 NOTE — Telephone Encounter (Signed)
Patient and pharmacy aware that Nasonex prescription has been sent in.

## 2017-04-10 ENCOUNTER — Telehealth: Payer: Self-pay | Admitting: Family Medicine

## 2017-04-11 ENCOUNTER — Encounter: Payer: Self-pay | Admitting: *Deleted

## 2017-04-11 NOTE — Telephone Encounter (Signed)
Pt aware - note will be up front. Return to work on [employer] or next sch'd work day

## 2017-04-18 ENCOUNTER — Other Ambulatory Visit: Payer: Self-pay

## 2017-04-18 ENCOUNTER — Ambulatory Visit (AMBULATORY_SURGERY_CENTER): Payer: 59 | Admitting: Internal Medicine

## 2017-04-18 ENCOUNTER — Encounter: Payer: Self-pay | Admitting: Internal Medicine

## 2017-04-18 VITALS — BP 121/65 | HR 75 | Temp 98.6°F | Resp 18 | Ht 66.0 in | Wt 257.0 lb

## 2017-04-18 DIAGNOSIS — D124 Benign neoplasm of descending colon: Secondary | ICD-10-CM

## 2017-04-18 DIAGNOSIS — Z1211 Encounter for screening for malignant neoplasm of colon: Secondary | ICD-10-CM | POA: Diagnosis present

## 2017-04-18 DIAGNOSIS — D122 Benign neoplasm of ascending colon: Secondary | ICD-10-CM | POA: Diagnosis not present

## 2017-04-18 DIAGNOSIS — K219 Gastro-esophageal reflux disease without esophagitis: Secondary | ICD-10-CM | POA: Diagnosis not present

## 2017-04-18 MED ORDER — SODIUM CHLORIDE 0.9 % IV SOLN: 500.0000 mL | Freq: Once | INTRAVENOUS | Status: DC

## 2017-04-18 NOTE — Patient Instructions (Signed)
Information on polyps and hemorrhoids given.    YOU HAD AN ENDOSCOPIC PROCEDURE TODAY AT THE [facility]:   Refer to the procedure report that was given to you for any specific questions about what was found during the examination.  If the procedure report does not answer your questions, please call your gastroenterologist to clarify.  If you requested that your care partner not be given the details of your procedure findings, then the procedure report has been included in a sealed envelope for you to review at your convenience later.  YOU SHOULD EXPECT: Some feelings of bloating in the abdomen. Passage of more gas than usual.  Walking can help get rid of the air that was put into your GI tract during the procedure and reduce the bloating. If you had a lower endoscopy (such as a colonoscopy or flexible sigmoidoscopy) you may notice spotting of blood in your stool or on the toilet paper. If you underwent a bowel prep for your procedure, you may not have a normal bowel movement for a few days.  Please Note:  You might notice some irritation and congestion in your nose or some drainage.  This is from the oxygen used during your procedure.  There is no need for concern and it should clear up in a day or so.  SYMPTOMS TO REPORT IMMEDIATELY:   Following lower endoscopy (colonoscopy or flexible sigmoidoscopy):  Excessive amounts of blood in the stool  Significant tenderness or worsening of abdominal pains  Swelling of the abdomen that is new, acute  Fever of 100F or higher  For urgent or emergent issues, a gastroenterologist can be reached at any hour by calling [phone].   DIET:  We do recommend a small meal at first, but then you may proceed to your regular diet.  Drink plenty of fluids but you should avoid alcoholic beverages for 24 hours.  ACTIVITY:  You should plan to take it easy for the rest of today and you should NOT DRIVE or use heavy machinery until tomorrow (because  of the sedation medicines used during the test).    FOLLOW UP: Our staff will call the number listed on your records the next business day following your procedure to check on you and address any questions or concerns that you may have regarding the information given to you following your procedure. If we do not reach you, we will leave a message.  However, if you are feeling well and you are not experiencing any problems, there is no need to return our call.  We will assume that you have returned to your regular daily activities without incident.  If any biopsies were taken you will be contacted by phone or by letter within the next 1-3 weeks.  Please call us at [phone] if you have not heard about the biopsies in 3 weeks.    SIGNATURES/CONFIDENTIALITY: You and/or your care partner have signed paperwork which will be entered into your electronic medical record.  These signatures attest to the fact that that the information above on your After Visit Summary has been reviewed and is understood.  Full responsibility of the confidentiality of this discharge information lies with you and/or your care-partner. 

## 2017-04-18 NOTE — Progress Notes (Signed)
Called to room to assist during endoscopic procedure.  Patient ID and intended procedure confirmed with present staff. Received instructions for my participation in the procedure from the performing physician.  

## 2017-04-18 NOTE — Op Note (Signed)
[facility] Patient Name: [name] [name] Procedure Date: 04/18/2017 7:54 AM MRN: [id number] Endoscopist: Docia Chuck. Henrene Pastor , MD Age: 47 Referring MD:  Date of Birth: 1970/04/01 Gender: Female Account #: [id number] Procedure:                Colonoscopy, With cold snare polypectomy -3 Indications:              High risk colon cancer surveillance: Personal                            history of sessile serrated colon polyp (less than                            10 mm in size) with no dysplasia. Index exam                            elsewhere 2011 with hyperplastic polyp; last exam                            here May 2013 with [facility] Medicines:                Monitored Anesthesia Care Procedure:                Pre-Anesthesia Assessment:                           - Prior to the procedure, a History and Physical                            was performed, and patient medications and                            allergies were reviewed. The patient's tolerance of                            previous anesthesia was also reviewed. The risks                            and benefits of the procedure and the sedation                            options and risks were discussed with the patient.                            All questions were answered, and informed consent                            was obtained. Prior Anticoagulants: The patient has                            taken no previous anticoagulant or antiplatelet                            agents. ASA Grade Assessment: II - A patient with  mild systemic disease. After reviewing the risks                            and benefits, the patient was deemed in                            satisfactory condition to undergo the procedure.                           After obtaining informed consent, the colonoscope                            was passed under direct vision. Throughout the                            procedure, the  patient's blood pressure, pulse, and                            oxygen saturations were monitored continuously. The                            Colonoscope was introduced through the anus and                            advanced to the the cecum, identified by                            appendiceal orifice and ileocecal valve. The                            ileocecal valve, appendiceal orifice, and rectum                            were photographed. The quality of the bowel                            preparation was excellent. The colonoscopy was                            performed without difficulty. The patient tolerated                            the procedure well. The bowel preparation used was                            SUPREP. Scope In: 8:34:43 AM Scope Out: 8:50:14 AM Scope Withdrawal Time: 0 hours 11 minutes 36 seconds  Total Procedure Duration: 0 hours 15 minutes 31 seconds  Findings:                 Three polyps were found in the descending colon and                            ascending colon. The polyps were 2 to 4 mm in size.  These polyps were removed with a cold snare.                            Resection and retrieval were complete.                           Internal hemorrhoids were found during retroflexion.                           The exam was otherwise without abnormality on                            direct and retroflexion views. Complications:            No immediate complications. Estimated blood loss:                            None. Estimated Blood Loss:     Estimated blood loss: none. Impression:               - Three 2 to 4 mm polyps in the descending colon                            and in the ascending colon, removed with a cold                            snare. Resected and retrieved.                           - Internal hemorrhoids.                           - The examination was otherwise normal on direct                             and retroflexion views. Recommendation:           - Repeat colonoscopy in 3 - 5 years for                            surveillance.                           - Patient has a contact number available for                            emergencies. The signs and symptoms of potential                            delayed complications were discussed with the                            patient. Return to normal activities tomorrow.                            Written discharge instructions were provided to the  patient.                           - Resume previous diet.                           - Continue present medications.                           - Await pathology results. Docia Chuck. Henrene Pastor, MD 04/18/2017 9:05:50 AM This report has been signed electronically.

## 2017-04-18 NOTE — Progress Notes (Signed)
Report to PACU, RN, vss, BBS= Clear.  

## 2017-04-19 ENCOUNTER — Telehealth: Payer: Self-pay

## 2017-04-19 NOTE — Telephone Encounter (Signed)
Attempted to reach patient for post-procedure f/u call. No answer. Left message that we will make another attempt to reach her later today and for her to please not hesitate to call us if she has any questions/concerns regarding her care. 

## 2017-04-19 NOTE — Telephone Encounter (Signed)
  Follow up Call-  Call back number 04/18/2017 01/14/2015  Post procedure Call Back phone  # [phone]  Permission to leave phone message Yes Yes  Some recent data might be hidden     Patient questions:  Do you have a fever, pain , or abdominal swelling? No. Pain Score  0 *  Have you tolerated food without any problems? Yes.    Have you been able to return to your normal activities? Yes.    Do you have any questions about your discharge instructions: Diet   No. Medications  No. Follow up visit  No.  Do you have questions or concerns about your Care? No.  Actions: * If pain score is 4 or above: No action needed, pain <4.

## 2017-04-24 ENCOUNTER — Encounter: Payer: Self-pay | Admitting: Internal Medicine

## 2017-04-25 ENCOUNTER — Other Ambulatory Visit: Payer: Self-pay | Admitting: Nurse Practitioner

## 2017-04-25 DIAGNOSIS — G43901 Migraine, unspecified, not intractable, with status migrainosus: Secondary | ICD-10-CM

## 2017-04-25 DIAGNOSIS — K219 Gastro-esophageal reflux disease without esophagitis: Secondary | ICD-10-CM

## 2017-05-06 ENCOUNTER — Encounter (HOSPITAL_COMMUNITY): Payer: Self-pay | Admitting: Neurology

## 2017-05-06 ENCOUNTER — Emergency Department (HOSPITAL_COMMUNITY)
Admission: EM | Admit: 2017-05-06 | Discharge: 2017-05-06 | Disposition: A | Discharge status: Home / Self Care | Payer: No Typology Code available for payment source | Attending: Emergency Medicine | Admitting: Emergency Medicine

## 2017-05-06 ENCOUNTER — Emergency Department (HOSPITAL_COMMUNITY): Payer: No Typology Code available for payment source

## 2017-05-06 DIAGNOSIS — M25551 Pain in right hip: Secondary | ICD-10-CM | POA: Diagnosis not present

## 2017-05-06 DIAGNOSIS — M545 Low back pain, unspecified: Secondary | ICD-10-CM

## 2017-05-06 DIAGNOSIS — M5489 Other dorsalgia: Secondary | ICD-10-CM | POA: Diagnosis not present

## 2017-05-06 DIAGNOSIS — W19XXXA Unspecified fall, initial encounter: Secondary | ICD-10-CM

## 2017-05-06 DIAGNOSIS — F17210 Nicotine dependence, cigarettes, uncomplicated: Secondary | ICD-10-CM | POA: Insufficient documentation

## 2017-05-06 DIAGNOSIS — T148XXA Other injury of unspecified body region, initial encounter: Secondary | ICD-10-CM | POA: Diagnosis not present

## 2017-05-06 DIAGNOSIS — E039 Hypothyroidism, unspecified: Secondary | ICD-10-CM | POA: Diagnosis not present

## 2017-05-06 DIAGNOSIS — S79911A Unspecified injury of right hip, initial encounter: Secondary | ICD-10-CM | POA: Diagnosis not present

## 2017-05-06 MED ORDER — METHOCARBAMOL 500 MG PO TABS: 500.0000 mg | ORAL_TABLET | Freq: Three times a day (TID) | ORAL | 0 refills | Status: AC | PRN

## 2017-05-06 MED ORDER — OXYCODONE-ACETAMINOPHEN 5-325 MG PO TABS
1.0000 | ORAL_TABLET | Freq: Once | ORAL | Status: AC
  Administered 2017-05-06: 1 via ORAL
  Filled 2017-05-06: qty 1

## 2017-05-06 MED ORDER — METHOCARBAMOL 500 MG PO TABS
1000.0000 mg | ORAL_TABLET | Freq: Once | ORAL | Status: AC
  Administered 2017-05-06: 1000 mg via ORAL
  Filled 2017-05-06: qty 2

## 2017-05-06 NOTE — ED Provider Notes (Signed)
[facility]ERGENC[facility] DEPARTMENT Provider Note   CSN: [id number] Arrival date & time: 05/06/17  0750     History   Chief Complaint Chief Complaint  Patient presents with  . Fall    HPI [name] [name] is a 47 y.o. female this ever relation of sudden onset, diffuse lower back pain onset PTA. Patient states she was walking into work when she slipped and fell on ice,states her feet moved forward from underneath her and she landed directly on her low back. Pain is constant, throbbing and spasm-like. Pain is worse with bending at the waist, rolling in bed and walking. Slightly alleviated when she does not move or try to walk around. She was able to get up and ambulate after the fall onto the stretcher with some pain. She denies head trauma, headache, neck pain, LOC, anticoagulants. She denies numbness, tingling, weakness to her extremities, saddle anesthesia, loss of bladder or bowel control since the fall. No previous history of back injuries or surgeries. No interventions PTA. Has been otherwise feeling at baseline without any fevers, abdominal pain, nausea, vomiting, urinary symptoms, diarrhea or constipation.  HPI  Past Medical History:  Diagnosis Date  . Allergy    SEASONAL  . Anxiety   . Chronic headaches   . Colon polyps   . GERD (gastroesophageal reflux disease)   . History of kidney stones   . Hyperlipidemia   . Hypothyroidism   . Palpitations   . Sleep apnea     Patient Active Problem List   Diagnosis Date Noted  . Hidradenitis suppurativa 10/01/2016  . OSA (obstructive sleep apnea) 05/13/2015  . Morbid obesity ([place]) 05/13/2015  . Acute maxillary sinusitis 01/10/2015  . Lumbago 11/11/2014  . Hypothyroidism 03/27/2014  . Panic attacks 03/27/2014  . SVT (supraventricular tachycardia) ([place]) 06/26/2012  . Migraines 06/26/2012    Past Surgical History:  Procedure Laterality Date  . CESAREAN SECTION     1194  . COLONOSCOPY    . LITHOTRIPSY  12/2014      OB History    No data available       Home Medications    Prior to Admission medications   Medication Sig Start Date End Date Taking? Authorizing Provider  amoxicillin-clavulanate (AUGMENTIN) 875-125 MG tablet Take 1 tablet by mouth 2 (two) times daily. Take all of this medication 04/09/17   Claretta Fraise, MD  clindamycin (CLEOCIN-T) 1 % external solution Apply topically 2 (two) times daily. Patient not taking: Reported on 04/18/2017 10/01/16   Sharion Balloon, FNP  clonazePAM (KLONOPIN) 0.5 MG tablet TAKE ONE TABLET BY MOUTH TWICE DAILY AS NEEDED 03/07/17   Chevis Pretty, FNP  cyclobenzaprine (FLEXERIL) 10 MG tablet TAKE ONE TABLET BY MOUTH THREE TIMES DAILY 03/14/17   Hassell Done, Mary-Margaret, FNP  fexofenadine (ALLEGRA) 180 MG tablet Take 180 mg by mouth daily.    [provider]  fluticasone (FLONASE) 50 MCG/ACT nasal spray Place 2 sprays into both nostrils daily.    [provider]  levothyroxine (SYNTHROID, LEVOTHROID) 112 MCG tablet TAKE 1 TABLET BY MOUTH EVERY DAY BEFORE BREAKFAST 10/03/16   Hassell Done, Mary-Margaret, FNP  meclizine (ANTIVERT) 25 MG tablet Take 1 tablet (25 mg total) by mouth 3 (three) times daily as needed for dizziness. Patient not taking: Reported on 04/18/2017 02/20/17   Claretta Fraise, MD  medroxyPROGESTERone (DEPO-PROVERA) 150 MG/ML injection INJECT 1 ML INTRAMUSCULAR EVERY 90 DAYS 08/16/16   [provider]  methocarbamol (ROBAXIN) 500 MG tablet Take 1 tablet (500 mg  total) by mouth every 8 (eight) hours as needed for up to 5 days for muscle spasms. 05/06/17 05/11/17  Kinnie Feil, PA-C  metoCLOPramide (REGLAN) 10 MG tablet TAKE 1 TABLET BY MOUTH DAILY 04/25/17   Hassell Done, Mary-Margaret, FNP  mometasone (NASONEX) 50 MCG/ACT nasal spray Place 2 sprays into the nose daily. 04/09/17   Claretta Fraise, MD  naproxen (NAPROSYN) 500 MG tablet Take 1 tablet (500 mg total) by mouth 2 (two) times daily with a meal. 10/11/16   Hawks, Alyse Low A, FNP   ondansetron (ZOFRAN) 8 MG tablet Take 1 tablet (8 mg total) by mouth every 8 (eight) hours as needed for nausea or vomiting. Patient not taking: Reported on 04/18/2017 02/20/17   Claretta Fraise, MD  oxybutynin (DITROPAN-XL) 5 MG 24 hr tablet  03/22/15   [provider]  pantoprazole (PROTONIX) 40 MG tablet TAKE ONE TABLET BY MOUTH TWICE DAILY - 30 MINUTES PRIOR TO BREAKFAST 08/09/16   Hassell Done, Mary-Margaret, FNP  propranolol (INDERAL) 80 MG tablet TAKE 1 TABLET BY MOUTH DAILY 04/25/17   Hassell Done, Mary-Margaret, FNP  Pseudoephedrine-Guaifenesin [phone] MG TB12 Take 1 tablet by mouth 2 (two) times daily. For congestion Patient not taking: Reported on 04/18/2017 04/09/17   Claretta Fraise, MD  ranitidine (ZANTAC) 150 MG tablet TAKE 1 TABLET BY MOUTH DAILY 04/25/17   Chevis Pretty, FNP    Family History Family History  Problem Relation Age of Onset  . Heart disease Maternal Grandfather   . Heart disease Maternal Grandmother   . Lung cancer Father        died from  . Breast cancer Paternal Grandmother        paternal aunts x2  . Hypertension Mother   . Colon cancer Neg Hx   . Esophageal cancer Neg Hx   . Rectal cancer Neg Hx   . Stomach cancer Neg Hx     Social History Social History   Tobacco Use  . Smoking status: Current Every Day Smoker    Packs/day: 0.50    Years: 7.00    Pack years: 3.50    Types: Cigarettes    Start date: 10/10/2016  . Smokeless tobacco: Never Used  . Tobacco comment: patient has patches but has not began yet, has program through her employer  Substance Use Topics  . Alcohol use: Yes    Alcohol/week: 0.0 oz    Comment: rare  . Drug use: No     Allergies   Patient has no known allergies.   Review of Systems Review of Systems  Musculoskeletal: Positive for back pain and myalgias.  All other systems reviewed and are negative.    Physical Exam Updated Vital Signs BP 128/83 (BP Location: Right Arm)   Pulse 73   Temp 98.1 F (36.7 C)  (Oral)   Resp 18   Ht 5\' 6"  (1.676 m)   Wt 113.4 kg (250 lb)   SpO2 96%   BMI 40.35 kg/m   Physical Exam  Constitutional: [name] oriented to person, place, and time. She appears well-developed and well-nourished. No distress.  Non toxic  HENT:  Head: Normocephalic and atraumatic.  Nose: Nose normal.  Mouth/Throat: No oropharyngeal exudate.  Moist mucous membranes   Eyes: Conjunctivae and EOM are normal. Pupils are equal, round, and reactive to light.  Neck: Normal range of motion.  Cardiovascular: Normal rate, regular rhythm and intact distal pulses.  No murmur heard. 2+ DP and radial pulses bilaterally. No LE edema.   Pulmonary/Chest: Effort normal and  breath sounds normal. No respiratory distress. She has no wheezes. She has no rales.  Abdominal: Soft. Bowel sounds are normal. There is no tenderness.  No G/R/R. No suprapubic or CVA tenderness.   Musculoskeletal: Normal range of motion. She exhibits no deformity.       Lumbar back: She exhibits pain.  Diffuse lumbar muscular tenderness. Patient is able to take her pants off and roll over to her side in bed with some discomfort. Pelvis is stable, no leg shortening or internal os rotation. No midline CTL spine tenderness or step-offs. No sacral tenderness. No SI joint or sciatic notch tenderness bilaterally.  Negative SLR bilaterally. No pain with hip IR/ER.   Neurological: [name] alert and oriented to person, place, and time.  Sensation to the lower extremities grossly intact bilaterally. 5/5 strength with ankle flexion and extension. Good ankle DTRs. Normal plantar response bilaterally.  Skin: Skin is warm and dry. Capillary refill takes less than 2 seconds.  No ecchymosis, abrasions or signs of skin injury to the low back or buttocks.  Psychiatric: She has a normal mood and affect. Her behavior is normal. Judgment and thought content normal.  Nursing note and vitals reviewed.    ED Treatments / Results  Labs (all labs ordered  are listed, but only abnormal results are displayed) Labs Reviewed - No data to display  EKG  EKG Interpretation None       Radiology Dg Lumbar Spine Complete  Result Date: 05/06/2017 CLINICAL DATA:  Low back pain after fall. EXAM: LUMBAR SPINE - COMPLETE 4+ VIEW COMPARISON:  CT abdomen pelvis dated December 14, 2014. FINDINGS: Five lumbar type vertebral bodies. Slight irregularity along the anterior superior endplate of N98, not clearly seen on the prior study. Vertebral body heights are preserved. Alignment is normal. Mild anterior endplate spurring. Intervertebral disc spaces are maintained. The sacroiliac joints are intact. IMPRESSION: Slight irregularity along the anterior superior endplate of T12 may be degenerative, however a small fracture could appear similar. Correlate with point tenderness and consider further evaluation with CT. Electronically Signed   By: Titus Dubin M.D.   On: 05/06/2017 08:52   Dg Hip Unilat W Or Wo Pelvis 2-3 Views Right  Result Date: 05/06/2017 CLINICAL DATA:  Right hip pain after fall. EXAM: DG HIP (WITH OR WITHOUT PELVIS) 2-3V RIGHT COMPARISON:  CT abdomen pelvis dated December 14, 2014. FINDINGS: No acute fracture or dislocation. Marginal acetabular osteophytes bilaterally with relative preservation of the hip joint spaces. The pubic symphysis and sacroiliac joints are intact. Phleboliths in the pelvis. Soft tissues are unremarkable. IMPRESSION: No acute osseous abnormality. If occult hip fracture is suspected or if the patient is unable to bear weight, MRI is the preferred modality for further evaluation. Electronically Signed   By: Titus Dubin M.D.   On: 05/06/2017 08:54    Procedures Procedures (including critical care time)  Medications Ordered in ED Medications  oxyCODONE-acetaminophen (PERCOCET/ROXICET) 5-325 MG per tablet 1 tablet (1 tablet Oral Given 05/06/17 0812)  methocarbamol (ROBAXIN) tablet 1,000 mg (1,000 mg Oral Given 05/06/17 9211)      Initial Impression / Assessment and Plan / ED Course  I have reviewed the triage vital signs and the nursing notes.  Pertinent labs & imaging results that were available during my care of the patient were reviewed by me and considered in my medical decision making (see chart for details).  Clinical Course as of May 06 916  Mon May 06, 2017  9417 IMPRESSION: No acute  osseous abnormality. If occult hip fracture is suspected or if the patient is unable to bear weight, MRI is the preferred modality for further evaluation. DG Hip Unilat W or Wo Pelvis 2-3 Views Right [CG]  0900 IMPRESSION: Slight irregularity along the anterior superior endplate of T12 may be degenerative, however a small fracture could appear similar. Correlate with point tenderness and consider further evaluation with CT. DG Lumbar Spine Complete [CG]  0901 Re-evaluated pt. [name] ambulatory to and back from bathroom, states walking makes low back pain worse on left side, but has no hip pain.   [CG]    Clinical Course User Index [CG] Kinnie Feil, PA-C   47 year old here with traumatic back pain after mechanical fall. No danger signs of back pain. She has diffuse bilateral lumbar muscle tenderness but no CTL spine spinous process tenderness or step-offs. No signs of radiculopathy. She has pain with weightbearing and walking, bending to her low back. X-rays are reassuring. There is a slight irregularity along endplate of Z61 noted but she has no focal tenderness here, this is likely not acute. Doubt occult hip fracture, she has no actual hip pain with weightbearing or internal/external rotation. She has been ambulatory in the emergency department. We'll discharge with medical management and follow-up in one week with PCP. Discussed return precautions. Patient requesting work note, given.  Final Clinical Impressions(s) / ED Diagnoses   Final diagnoses:  Fall, initial encounter  Acute bilateral low back pain without  sciatica    ED Discharge Orders        Ordered    methocarbamol (ROBAXIN) 500 MG tablet  Every 8 hours PRN     05/06/17 0912       Kinnie Feil, PA-C 05/06/17 0960    Jola Schmidt, MD 05/06/17 [phone]

## 2017-05-06 NOTE — ED Triage Notes (Signed)
Pt reports was walking into work today and slipped on some ice, landed on her back. No LOC. C/o lower back pain 8/10. Wearing c-collar. BP 141/59, HR 76, 96% RA

## 2017-05-06 NOTE — Discharge Instructions (Signed)
X-rays normal.   Take muscle relaxer every 8 hours. For pain take 1000 mg tylenol plus 600 mg ibuprofen together every 8 hours. Heat, light massage and stretches. Avoid sitting down or laying down for prolonged periods of time. Follow up with primary care doctor in 1 week if pain persists.   Return for loss of bladder or bowel incontinence or retention, groin numbness, numbness or weakness to extremities

## 2017-05-09 ENCOUNTER — Ambulatory Visit (INDEPENDENT_AMBULATORY_CARE_PROVIDER_SITE_OTHER): Payer: 59 | Admitting: Otolaryngology

## 2017-05-13 ENCOUNTER — Other Ambulatory Visit: Payer: Self-pay | Admitting: Family

## 2017-05-13 DIAGNOSIS — M5441 Lumbago with sciatica, right side: Secondary | ICD-10-CM

## 2017-05-26 ENCOUNTER — Other Ambulatory Visit: Payer: Self-pay | Admitting: Nurse Practitioner

## 2017-06-03 ENCOUNTER — Other Ambulatory Visit: Payer: Self-pay | Admitting: Nurse Practitioner

## 2017-06-03 DIAGNOSIS — E039 Hypothyroidism, unspecified: Secondary | ICD-10-CM

## 2017-06-10 ENCOUNTER — Ambulatory Visit (INDEPENDENT_AMBULATORY_CARE_PROVIDER_SITE_OTHER): Payer: 59 | Admitting: Otolaryngology

## 2017-06-10 DIAGNOSIS — H9012 Conductive hearing loss, unilateral, left ear, with unrestricted hearing on the contralateral side: Secondary | ICD-10-CM | POA: Diagnosis not present

## 2017-06-10 DIAGNOSIS — H6983 Other specified disorders of Eustachian tube, bilateral: Secondary | ICD-10-CM

## 2017-06-11 ENCOUNTER — Other Ambulatory Visit: Payer: Self-pay | Admitting: Nurse Practitioner

## 2017-06-11 DIAGNOSIS — E039 Hypothyroidism, unspecified: Secondary | ICD-10-CM

## 2017-06-12 ENCOUNTER — Other Ambulatory Visit: Payer: Self-pay | Admitting: Nurse Practitioner

## 2017-06-12 NOTE — Telephone Encounter (Signed)
LAST SEEN 04/09/16 Dr Livia Snellen  MMM PCP

## 2017-07-02 ENCOUNTER — Other Ambulatory Visit: Payer: Self-pay | Admitting: Nurse Practitioner

## 2017-07-02 DIAGNOSIS — E039 Hypothyroidism, unspecified: Secondary | ICD-10-CM

## 2017-07-02 NOTE — Telephone Encounter (Signed)
Last thyroid 05/25/16  MMM

## 2017-07-08 ENCOUNTER — Other Ambulatory Visit: Payer: Self-pay | Admitting: Nurse Practitioner

## 2017-07-08 DIAGNOSIS — E039 Hypothyroidism, unspecified: Secondary | ICD-10-CM

## 2017-07-08 DIAGNOSIS — M5441 Lumbago with sciatica, right side: Secondary | ICD-10-CM

## 2017-07-17 ENCOUNTER — Encounter: Payer: Self-pay | Admitting: Family

## 2017-07-17 ENCOUNTER — Ambulatory Visit (INDEPENDENT_AMBULATORY_CARE_PROVIDER_SITE_OTHER): Payer: 59

## 2017-07-17 ENCOUNTER — Ambulatory Visit: Payer: 59 | Admitting: Family

## 2017-07-17 VITALS — BP 110/72 | HR 100 | Temp 97.2°F | Ht 66.0 in | Wt 259.0 lb

## 2017-07-17 DIAGNOSIS — M25561 Pain in right knee: Secondary | ICD-10-CM

## 2017-07-17 DIAGNOSIS — M25562 Pain in left knee: Secondary | ICD-10-CM | POA: Diagnosis not present

## 2017-07-17 MED ORDER — DICLOFENAC SODIUM 75 MG PO TBEC: 75.0000 mg | DELAYED_RELEASE_TABLET | Freq: Two times a day (BID) | ORAL | 1 refills | Status: DC

## 2017-07-17 NOTE — Progress Notes (Signed)
   Subjective:    Patient ID: [name] [name], female    DOB: February 26, 1971, 47 y.o.   MRN: [id number]  Chief Complaint  Patient presents with  . knee and hip pain    Hip Pain   Pertinent negatives include no inability to bear weight, numbness or tingling.  Knee Pain   The incident occurred more than 1 week ago. There was no injury mechanism. The pain is present in the left hip, left leg and left foot. The pain is at a severity of 8/10. The pain is moderate. The pain has been intermittent since onset. Pertinent negatives include no inability to bear weight, numbness or tingling. She reports no foreign bodies present. The symptoms are aggravated by movement. She has tried acetaminophen, rest, NSAIDs and non-weight bearing for the symptoms. The treatment provided no relief.      Review of Systems  Musculoskeletal: Positive for arthralgias.  Neurological: Negative for tingling and numbness.  All other systems reviewed and are negative.      Objective:   Physical Exam  Constitutional: [name] oriented to person, place, and time. She appears well-developed and well-nourished. No distress.  HENT:  Head: Normocephalic.  Cardiovascular: Normal rate, regular rhythm, normal heart sounds and intact distal pulses.  No murmur heard. Pulmonary/Chest: Effort normal and breath sounds normal. No respiratory distress. She has no wheezes.  Abdominal: Soft. Bowel sounds are normal. She exhibits no distension. There is no tenderness.  Musculoskeletal: Normal range of motion. She exhibits tenderness. She exhibits no edema.  Mild pain wraps around  in knee with extension. Full ROM  Neurological: [name] alert and oriented to person, place, and time. She has normal reflexes.  Skin: Skin is warm and dry.  Psychiatric: She has a normal mood and affect. Her behavior is normal. Judgment and thought content normal.  Vitals reviewed.     BP 110/72   Pulse 100   Temp (!) 97.2 F (36.2 C) (Oral)   Ht 5\' 6"   (1.676 m)   Wt 259 lb (117.5 kg)   BMI 41.80 kg/m      Assessment & Plan:  [name] [name] was seen today for knee and hip pain.  Diagnoses and all orders for this visit:  Acute pain of right knee -     DG Knee 1-2 Views Left; Future -     diclofenac (VOLTAREN) 75 MG EC tablet; Take 1 tablet (75 mg total) by mouth 2 (two) times daily.   Rest Ice ROM exercises If pain continues may need to send to Ortho Pt to schedule CPE with PCP  Evelina Dun, FNP

## 2017-07-17 NOTE — Patient Instructions (Signed)

## 2017-07-22 ENCOUNTER — Other Ambulatory Visit: Payer: Self-pay | Admitting: Nurse Practitioner

## 2017-07-22 NOTE — Telephone Encounter (Signed)
Last refill without being seen 

## 2017-08-01 ENCOUNTER — Telehealth: Payer: Self-pay | Admitting: Family

## 2017-08-01 DIAGNOSIS — M25562 Pain in left knee: Secondary | ICD-10-CM

## 2017-08-01 NOTE — Telephone Encounter (Signed)
lmtcb

## 2017-08-01 NOTE — Telephone Encounter (Signed)
Ortho referral sent 

## 2017-08-07 ENCOUNTER — Ambulatory Visit (INDEPENDENT_AMBULATORY_CARE_PROVIDER_SITE_OTHER): Payer: 59 | Admitting: Orthopaedic Surgery

## 2017-08-07 ENCOUNTER — Encounter (INDEPENDENT_AMBULATORY_CARE_PROVIDER_SITE_OTHER): Payer: Self-pay | Admitting: Orthopaedic Surgery

## 2017-08-07 DIAGNOSIS — M25562 Pain in left knee: Secondary | ICD-10-CM

## 2017-08-07 DIAGNOSIS — G8929 Other chronic pain: Secondary | ICD-10-CM | POA: Diagnosis not present

## 2017-08-07 MED ORDER — BUPIVACAINE HCL 0.5 % IJ SOLN
2.0000 mL | INTRAMUSCULAR | Status: AC | PRN
  Administered 2017-08-07: 2 mL via INTRA_ARTICULAR

## 2017-08-07 MED ORDER — METHYLPREDNISOLONE ACETATE 40 MG/ML IJ SUSP
80.0000 mg | INTRAMUSCULAR | Status: AC | PRN
  Administered 2017-08-07: 80 mg

## 2017-08-07 MED ORDER — LIDOCAINE HCL 1 % IJ SOLN
2.0000 mL | INTRAMUSCULAR | Status: AC | PRN
  Administered 2017-08-07: 2 mL

## 2017-08-07 NOTE — Telephone Encounter (Signed)
Patient went to ortho today 08/07/17

## 2017-08-07 NOTE — Progress Notes (Signed)
Office Visit Note   Patient: [name] [name]           Date of Birth: 04-04-1970           MRN: [id number] Visit Date: 08/07/2017              Requested by: Sharion Balloon, [address], [place] [address] PCP: Chevis Pretty, FNP   Assessment & Plan: Visit Diagnoses:  1. Chronic pain of left knee     Plan: Pain with several diagnostic possibilities including tear of lateral meniscus and/or osteoarthritis.  Will inject with cortisone and monitor response over the next 3 to 4 weeks  Follow-Up Instructions: Return if symptoms worsen or fail to improve.   Orders:  No orders of the defined types were placed in this encounter.  No orders of the defined types were placed in this encounter.     Procedures: Large Joint Inj: L knee on 08/07/2017 9:58 AM Indications: pain and diagnostic evaluation Details: 25 G 1.5 in needle, anterolateral approach  Arthrogram: No  Medications: 2 mL lidocaine 1 %; 2 mL bupivacaine 0.5 %; 80 mg methylPREDNISolone acetate 40 MG/ML Outcome: tolerated well, no immediate complications Procedure, treatment alternatives, risks and benefits explained, specific risks discussed. Consent was given by the patient. Patient was prepped and draped in the usual sterile fashion.       Clinical Data: No additional findings.   Subjective: No chief complaint on file. 47 year old female noted insidious onset of left knee pain approximately 6 weeks ago.  No history of injury or trauma.  She has episodes of pain after getting up from a sitting position.  Oftentimes she will limp for "hours".  [name] not aware that she has had any swelling of her knee but sometimes has a feeling as if there was a vice applied to her knee.  On occasion she has had a sensation of her knee giving way.  [name] tried Voltaren, ice, and even water exercises without much relief.  She does walk on cement over the course of her workday.  [name] not having any groin or  thigh pain.  Denies any back pain.  There is no numbness or tingling.  Denies fever or chills. X-rays of the knee were performed approximately 3 weeks ago without any obvious abnormality.  I reviewed these in the PACS system and agree  HPI  Review of Systems   Objective: Vital Signs: There were no vitals taken for this visit.  Physical Exam  Constitutional: [name] oriented to person, place, and time. She appears well-developed and well-nourished.  HENT:  Mouth/Throat: Oropharynx is clear and moist.  Eyes: Pupils are equal, round, and reactive to light. EOM are normal.  Pulmonary/Chest: Effort normal.  Neurological: [name] alert and oriented to person, place, and time.  Skin: Skin is warm and dry.  Psychiatric: She has a normal mood and affect. Her behavior is normal.    Ortho Exam awake alert and oriented x3.  Comfortable sitting.  Examination of left knee without effusion.  Predominantly lateral joint pain.  Some patellar crepitation.  No effusion.  No instability.  Full extension.  Flexed over 100 degrees.  No popliteal pain or mass.  No calf pain.  Neurovascular exam intact.  Painless range of motion of left hip.  Straight leg raise negative.  Skin intact  Specialty Comments:  No specialty comments available.  Imaging: No results found.   PMFS History: Patient Active Problem List  Diagnosis Date Noted  . Hidradenitis suppurativa 10/01/2016  . OSA (obstructive sleep apnea) 05/13/2015  . Morbid obesity ([place]) 05/13/2015  . Acute maxillary sinusitis 01/10/2015  . Lumbago 11/11/2014  . Hypothyroidism 03/27/2014  . Panic attacks 03/27/2014  . SVT (supraventricular tachycardia) ([place]) 06/26/2012  . Migraines 06/26/2012   Past Medical History:  Diagnosis Date  . Allergy    SEASONAL  . Anxiety   . Chronic headaches   . Colon polyps   . GERD (gastroesophageal reflux disease)   . History of kidney stones   . Hyperlipidemia   . Hypothyroidism   . Palpitations   . Sleep  apnea     Family History  Problem Relation Age of Onset  . Heart disease Maternal Grandfather   . Heart disease Maternal Grandmother   . Lung cancer Father        died from  . Breast cancer Paternal Grandmother        paternal aunts x2  . Hypertension Mother   . Colon cancer Neg Hx   . Esophageal cancer Neg Hx   . Rectal cancer Neg Hx   . Stomach cancer Neg Hx     Past Surgical History:  Procedure Laterality Date  . CESAREAN SECTION     1194  . COLONOSCOPY    . LITHOTRIPSY  12/2014   Social History   Occupational History    Employer: [employer]  Tobacco Use  . Smoking status: Current Every Day Smoker    Packs/day: 0.50    Years: 7.00    Pack years: 3.50    Types: Cigarettes    Start date: 10/10/2016  . Smokeless tobacco: Never Used  . Tobacco comment: patient has patches but has not began yet, has program through her employer  Substance and Sexual Activity  . Alcohol use: Yes    Alcohol/week: 0.0 oz    Comment: rare  . Drug use: No  . Sexual activity: Not on file     Garald Balding, MD   Note - This record has been created using [employer].  Chart creation errors have been sought, but may not always  have been located. Such creation errors do not reflect on  the standard of medical care.

## 2017-08-08 ENCOUNTER — Other Ambulatory Visit: Payer: Self-pay | Admitting: Nurse Practitioner

## 2017-08-09 DIAGNOSIS — G4733 Obstructive sleep apnea (adult) (pediatric): Secondary | ICD-10-CM | POA: Diagnosis not present

## 2017-08-15 ENCOUNTER — Ambulatory Visit (INDEPENDENT_AMBULATORY_CARE_PROVIDER_SITE_OTHER): Payer: 59 | Admitting: Otolaryngology

## 2017-08-15 DIAGNOSIS — H6983 Other specified disorders of Eustachian tube, bilateral: Secondary | ICD-10-CM

## 2017-08-22 ENCOUNTER — Other Ambulatory Visit: Payer: Self-pay | Admitting: Nurse Practitioner

## 2017-08-22 DIAGNOSIS — E039 Hypothyroidism, unspecified: Secondary | ICD-10-CM

## 2017-08-22 NOTE — Telephone Encounter (Signed)
Last refill without being seen 

## 2017-08-30 ENCOUNTER — Other Ambulatory Visit: Payer: Self-pay | Admitting: Nurse Practitioner

## 2017-08-30 DIAGNOSIS — K219 Gastro-esophageal reflux disease without esophagitis: Secondary | ICD-10-CM

## 2017-09-09 ENCOUNTER — Other Ambulatory Visit: Payer: Self-pay | Admitting: Nurse Practitioner

## 2017-09-09 DIAGNOSIS — M545 Low back pain, unspecified: Secondary | ICD-10-CM

## 2017-09-10 ENCOUNTER — Other Ambulatory Visit: Payer: Self-pay | Admitting: Nurse Practitioner

## 2017-09-10 DIAGNOSIS — E039 Hypothyroidism, unspecified: Secondary | ICD-10-CM

## 2017-09-10 DIAGNOSIS — G43901 Migraine, unspecified, not intractable, with status migrainosus: Secondary | ICD-10-CM

## 2017-09-10 DIAGNOSIS — M5441 Lumbago with sciatica, right side: Secondary | ICD-10-CM

## 2017-09-10 DIAGNOSIS — K219 Gastro-esophageal reflux disease without esophagitis: Secondary | ICD-10-CM

## 2017-09-19 ENCOUNTER — Encounter (INDEPENDENT_AMBULATORY_CARE_PROVIDER_SITE_OTHER): Payer: Self-pay | Admitting: Orthopaedic Surgery

## 2017-09-19 ENCOUNTER — Other Ambulatory Visit: Payer: Self-pay | Admitting: Nurse Practitioner

## 2017-09-19 ENCOUNTER — Ambulatory Visit (INDEPENDENT_AMBULATORY_CARE_PROVIDER_SITE_OTHER): Payer: 59 | Admitting: Orthopaedic Surgery

## 2017-09-19 VITALS — BP 116/80 | HR 94 | Ht 66.0 in | Wt 250.0 lb

## 2017-09-19 DIAGNOSIS — M25562 Pain in left knee: Secondary | ICD-10-CM

## 2017-09-19 DIAGNOSIS — K219 Gastro-esophageal reflux disease without esophagitis: Secondary | ICD-10-CM

## 2017-09-19 NOTE — Progress Notes (Signed)
Office Visit Note   Patient: [name] [name]           Date of Birth: [birth year]           MRN: [id number] Visit Date: 09/19/2017              Requested by: Chevis Pretty, [address], [place] [address] PCP: Chevis Pretty, FNP   Assessment & Plan: Visit Diagnoses:  1. Acute pain of left knee     Plan: Cortisone injection left knee may made a difference until this morning when she felt a "pop" in her left knee.  Has had just some discomfort but feeling a little better over the last several hours.  Long discussion regarding diagnostic possibilities.  As she is feeling better it might be worthwhile to at least consider an MRI scan.  We will keep her out of work for several days and she will call if she wants to consider the MRI scan.  At least we might be able to get it approved.  Films of her knee and May were negative for any arthritis or acute changes  Follow-Up Instructions: Return will order MRI left knee.   Orders:  No orders of the defined types were placed in this encounter.  No orders of the defined types were placed in this encounter.     Procedures: No procedures performed   Clinical Data: No additional findings.   Subjective: Chief Complaint  Patient presents with  . Follow-up    L KNEE PAIN HEARD POP IN IT THIS MORNING WHEN GETTING INTO CAR. 08/07/17 INJECTION HELPED SOME   Visits the office for reevaluation of left knee pain after feeling a "pop" this morning.  Feeling better now but was having some pain after that event.  Pain is localized along the lateral aspect of her left knee.  HPI  Review of Systems  Constitutional: Negative for fatigue and fever.  HENT: Negative for ear pain.   Eyes: Negative for pain.  Respiratory: Negative for cough and shortness of breath.   Cardiovascular: Negative for leg swelling.  Gastrointestinal: Negative for diarrhea and nausea.  Genitourinary: Negative for difficulty urinating.    Musculoskeletal: Positive for back pain and neck pain.  Skin: Negative for rash.  Neurological: Positive for weakness and numbness.  Hematological: Does not bruise/bleed easily.  Psychiatric/Behavioral: Negative for sleep disturbance.     Objective: Vital Signs: BP 116/80 (BP Location: Right Arm, Patient Position: Sitting, Cuff Size: Normal)   Pulse 94   Ht 5\' 6"  (1.676 m)   Wt 250 lb (113.4 kg)   BMI 40.35 kg/m   Physical Exam  Constitutional: [name] oriented to person, place, and time. She appears well-developed and well-nourished.  HENT:  Mouth/Throat: Oropharynx is clear and moist.  Eyes: Pupils are equal, round, and reactive to light. EOM are normal.  Pulmonary/Chest: Effort normal.  Neurological: [name] alert and oriented to person, place, and time.  Skin: Skin is warm and dry.  Psychiatric: She has a normal mood and affect. Her behavior is normal.    Ortho Exam awake alert and oriented x3.  Comfortable sitting.  Walk without a limp.  Very small effusion left knee compared to the right.  Left knee was not hot warm red or swollen.  Some tenderness along the lateral joint.  None beneath the patella or medially.  No calf pain.  No popliteal mass.  Neurovascular exam distally.  Painless range of motion both hips  Specialty Comments:  No specialty comments available.  Imaging: No results found.   PMFS History: Patient Active Problem List   Diagnosis Date Noted  . Hidradenitis suppurativa 10/01/2016  . OSA (obstructive sleep apnea) 05/13/2015  . Morbid obesity ([place]) 05/13/2015  . Acute maxillary sinusitis 01/10/2015  . Lumbago 11/11/2014  . Hypothyroidism 03/27/2014  . Panic attacks 03/27/2014  . SVT (supraventricular tachycardia) ([place]) 06/26/2012  . Migraines 06/26/2012   Past Medical History:  Diagnosis Date  . Allergy    SEASONAL  . Anxiety   . Chronic headaches   . Colon polyps   . GERD (gastroesophageal reflux disease)   . History of kidney stones   .  Hyperlipidemia   . Hypothyroidism   . Palpitations   . Sleep apnea     Family History  Problem Relation Age of Onset  . Heart disease Maternal Grandfather   . Heart disease Maternal Grandmother   . Lung cancer Father        died from  . Breast cancer Paternal Grandmother        paternal aunts x2  . Hypertension Mother   . Colon cancer Neg Hx   . Esophageal cancer Neg Hx   . Rectal cancer Neg Hx   . Stomach cancer Neg Hx     Past Surgical History:  Procedure Laterality Date  . CESAREAN SECTION     1194  . COLONOSCOPY    . LITHOTRIPSY  12/2014   Social History   Occupational History    Employer: [employer]  Tobacco Use  . Smoking status: Current Every Day Smoker    Packs/day: 0.50    Years: 7.00    Pack years: 3.50    Types: Cigarettes    Start date: 10/10/2016  . Smokeless tobacco: Never Used  . Tobacco comment: patient has patches but has not began yet, has program through her employer  Substance and Sexual Activity  . Alcohol use: Yes    Alcohol/week: 0.0 oz    Comment: rare  . Drug use: No  . Sexual activity: Not on file

## 2017-09-30 ENCOUNTER — Telehealth (INDEPENDENT_AMBULATORY_CARE_PROVIDER_SITE_OTHER): Payer: Self-pay | Admitting: Orthopaedic Surgery

## 2017-09-30 ENCOUNTER — Other Ambulatory Visit (INDEPENDENT_AMBULATORY_CARE_PROVIDER_SITE_OTHER): Payer: Self-pay | Admitting: *Deleted

## 2017-09-30 DIAGNOSIS — G8929 Other chronic pain: Secondary | ICD-10-CM

## 2017-09-30 DIAGNOSIS — M25562 Pain in left knee: Principal | ICD-10-CM

## 2017-09-30 NOTE — Telephone Encounter (Signed)
LMOM, order for MRI left knee at [facility], pending appt

## 2017-09-30 NOTE — Telephone Encounter (Signed)
Patient called checking on the status of her MRI for her left knee.  Patient has not received a phone call to schedule the appointment.

## 2017-10-02 ENCOUNTER — Other Ambulatory Visit: Payer: Self-pay | Admitting: *Deleted

## 2017-10-02 DIAGNOSIS — M545 Low back pain, unspecified: Secondary | ICD-10-CM

## 2017-10-02 DIAGNOSIS — M1712 Unilateral primary osteoarthritis, left knee: Secondary | ICD-10-CM | POA: Diagnosis not present

## 2017-10-02 DIAGNOSIS — M25562 Pain in left knee: Secondary | ICD-10-CM | POA: Diagnosis not present

## 2017-10-02 DIAGNOSIS — E039 Hypothyroidism, unspecified: Secondary | ICD-10-CM

## 2017-10-02 DIAGNOSIS — S83242A Other tear of medial meniscus, current injury, left knee, initial encounter: Secondary | ICD-10-CM | POA: Diagnosis not present

## 2017-10-02 MED ORDER — LEVOTHYROXINE SODIUM 112 MCG PO TABS: ORAL_TABLET | ORAL | 0 refills | Status: DC

## 2017-10-03 MED ORDER — CLONAZEPAM 0.5 MG PO TABS: 0.5000 mg | ORAL_TABLET | Freq: Two times a day (BID) | ORAL | 0 refills | Status: DC | PRN

## 2017-10-03 MED ORDER — CYCLOBENZAPRINE HCL 10 MG PO TABS: 10.0000 mg | ORAL_TABLET | Freq: Three times a day (TID) | ORAL | 0 refills | Status: DC

## 2017-10-04 ENCOUNTER — Ambulatory Visit (INDEPENDENT_AMBULATORY_CARE_PROVIDER_SITE_OTHER): Payer: 59 | Admitting: Orthopaedic Surgery

## 2017-10-04 ENCOUNTER — Encounter (INDEPENDENT_AMBULATORY_CARE_PROVIDER_SITE_OTHER): Payer: Self-pay | Admitting: Orthopaedic Surgery

## 2017-10-04 ENCOUNTER — Encounter: Payer: 59 | Admitting: Nurse Practitioner

## 2017-10-04 VITALS — BP 129/92 | HR 78

## 2017-10-04 DIAGNOSIS — M25562 Pain in left knee: Secondary | ICD-10-CM | POA: Diagnosis not present

## 2017-10-04 DIAGNOSIS — G8929 Other chronic pain: Secondary | ICD-10-CM | POA: Diagnosis not present

## 2017-10-04 NOTE — Progress Notes (Signed)
Office Visit Note   Patient: [name] [name]           Date of Birth: Dec 08, 1970           MRN: [id number] Visit Date: 10/04/2017              Requested by: [name] [name], [address] [place], [place] [address] PCP: [name] Pretty, FNP   Assessment & Plan: Visit Diagnoses:  1. Chronic pain of left knee     Plan: MRI scan left knee demonstrates a tear of the medial meniscus radial tear through the root of the posterior horn.  Long discussion regarding findings.  I would suggest arthroscopy.  Keep [name] [name] out of work until after surgery is scheduled .long discussion regarding the surgery what she can expect over time Follow-Up Instructions: No follow-ups on file.   Orders:  No orders of the defined types were placed in this encounter.  No orders of the defined types were placed in this encounter.     Procedures: No procedures performed   Clinical Data: No additional findings.   Subjective: No chief complaint on file. [name] [name] has been experiencing left knee pain for approximately 4 to 5 months.  She fell in February but was not experiencing pain until approximately the middle of March.  Since that time she has had considerable pain to the point of compromise.  She has had recurrent swelling and stiffness discomfort localized along the medial compartment.  She has not responded to time, I scan was just performed demonstrating a radial tear the root of the posterior horn of the medial meniscus  HPI  Review of Systems   Objective: Vital Signs: BP (!) 129/92 (BP Location: Left Arm, Patient Position: Sitting, Cuff Size: Normal)   Pulse 78   Physical Exam  Ortho Exam left knee with local tenderness along the posterior half.  No popping or clicking.  Full extension.  Pain with rotation of the knee.  No effusion.  No popliteal pain.  No calf pain.  Neurovascular exam intact  Specialty Comments:  No specialty comments available.  Imaging: No  results found.   PMFS History: Patient Active Problem List   Diagnosis Date Noted  . Hidradenitis suppurativa 10/01/2016  . OSA (obstructive sleep apnea) 05/13/2015  . Morbid obesity ([place]) 05/13/2015  . Acute maxillary sinusitis 01/10/2015  . Lumbago 11/11/2014  . Hypothyroidism 03/27/2014  . Panic attacks 03/27/2014  . SVT (supraventricular tachycardia) ([place]) 06/26/2012  . Migraines 06/26/2012   Past Medical History:  Diagnosis Date  . Allergy    SEASONAL  . Anxiety   . Chronic headaches   . Colon polyps   . GERD (gastroesophageal reflux disease)   . History of kidney stones   . Hyperlipidemia   . Hypothyroidism   . Palpitations   . Sleep apnea     Family History  Problem Relation Age of Onset  . Heart disease Maternal Grandfather   . Heart disease Maternal Grandmother   . Lung cancer Father        died from  . Breast cancer Paternal Grandmother        paternal aunts x2  . Hypertension Mother   . Colon cancer Neg Hx   . Esophageal cancer Neg Hx   . Rectal cancer Neg Hx   . Stomach cancer Neg Hx     Past Surgical History:  Procedure Laterality Date  . CESAREAN SECTION     1194  . COLONOSCOPY    .  LITHOTRIPSY  12/2014   Social History   Occupational History    Employer: [employer]  Tobacco Use  . Smoking status: Current Every Day Smoker    Packs/day: 0.50    Years: 7.00    Pack years: 3.50    Types: Cigarettes    Start date: 10/10/2016  . Smokeless tobacco: Never Used  . Tobacco comment: patient has patches but has not began yet, has program through her employer  Substance and Sexual Activity  . Alcohol use: Yes    Alcohol/week: 0.0 oz    Comment: rare  . Drug use: No  . Sexual activity: Not on file     [name] Balding, MD   Note - This record has been created using [employer].  Chart creation errors have been sought, but may not always  have been located. Such creation errors do not reflect on  the standard of medical  care.

## 2017-10-04 NOTE — Progress Notes (Deleted)
Office Visit Note   Patient: [name] [name]           Date of Birth: Feb 22, 1971           MRN: [id number] Visit Date: 10/04/2017              Requested by: Chevis Pretty, [address] [place], [place] [address] PCP: Chevis Pretty, FNP   Assessment & Plan: Visit Diagnoses: No diagnosis found.  Plan: ***  Follow-Up Instructions: No follow-ups on file.   Orders:  No orders of the defined types were placed in this encounter.  No orders of the defined types were placed in this encounter.     Procedures: No procedures performed   Clinical Data: No additional findings.   Subjective: No chief complaint on file.   HPI  Review of Systems  Constitutional: Positive for fatigue. Negative for fever.  HENT: Negative for ear pain.   Eyes: Negative for pain.  Respiratory: Negative for cough and shortness of breath.   Cardiovascular: Positive for leg swelling.  Gastrointestinal: Negative for constipation and diarrhea.  Genitourinary: Negative for difficulty urinating.  Musculoskeletal: Negative for back pain and neck pain.  Skin: Negative for rash.  Neurological: Positive for weakness. Negative for numbness.  Hematological: Bruises/bleeds easily.  Psychiatric/Behavioral: Positive for sleep disturbance.     Objective: Vital Signs: BP (!) 129/92 (BP Location: Left Arm, Patient Position: Sitting, Cuff Size: Normal)   Pulse 78   Physical Exam  Ortho Exam  Specialty Comments:  No specialty comments available.  Imaging: No results found.   PMFS History: Patient Active Problem List   Diagnosis Date Noted  . Hidradenitis suppurativa 10/01/2016  . OSA (obstructive sleep apnea) 05/13/2015  . Morbid obesity ([place]) 05/13/2015  . Acute maxillary sinusitis 01/10/2015  . Lumbago 11/11/2014  . Hypothyroidism 03/27/2014  . Panic attacks 03/27/2014  . SVT (supraventricular tachycardia) ([place]) 06/26/2012  . Migraines 06/26/2012   Past Medical  History:  Diagnosis Date  . Allergy    SEASONAL  . Anxiety   . Chronic headaches   . Colon polyps   . GERD (gastroesophageal reflux disease)   . History of kidney stones   . Hyperlipidemia   . Hypothyroidism   . Palpitations   . Sleep apnea     Family History  Problem Relation Age of Onset  . Heart disease Maternal Grandfather   . Heart disease Maternal Grandmother   . Lung cancer Father        died from  . Breast cancer Paternal Grandmother        paternal aunts x2  . Hypertension Mother   . Colon cancer Neg Hx   . Esophageal cancer Neg Hx   . Rectal cancer Neg Hx   . Stomach cancer Neg Hx     Past Surgical History:  Procedure Laterality Date  . CESAREAN SECTION     1194  . COLONOSCOPY    . LITHOTRIPSY  12/2014   Social History   Occupational History    Employer: [employer]  Tobacco Use  . Smoking status: Current Every Day Smoker    Packs/day: 0.50    Years: 7.00    Pack years: 3.50    Types: Cigarettes    Start date: 10/10/2016  . Smokeless tobacco: Never Used  . Tobacco comment: patient has patches but has not began yet, has program through her employer  Substance and Sexual Activity  . Alcohol use: Yes    Alcohol/week: 0.0 oz  Comment: rare  . Drug use: No  . Sexual activity: Not on file

## 2017-10-10 ENCOUNTER — Ambulatory Visit (INDEPENDENT_AMBULATORY_CARE_PROVIDER_SITE_OTHER): Payer: 59 | Admitting: Nurse Practitioner

## 2017-10-10 ENCOUNTER — Encounter: Payer: Self-pay | Admitting: Nurse Practitioner

## 2017-10-10 VITALS — BP 117/81 | HR 79 | Temp 98.2°F | Ht 66.0 in | Wt 250.0 lb

## 2017-10-10 DIAGNOSIS — Z0000 Encounter for general adult medical examination without abnormal findings: Secondary | ICD-10-CM

## 2017-10-10 DIAGNOSIS — I471 Supraventricular tachycardia: Secondary | ICD-10-CM

## 2017-10-10 DIAGNOSIS — F410 Panic disorder [episodic paroxysmal anxiety] without agoraphobia: Secondary | ICD-10-CM

## 2017-10-10 DIAGNOSIS — Z23 Encounter for immunization: Secondary | ICD-10-CM | POA: Diagnosis not present

## 2017-10-10 DIAGNOSIS — E039 Hypothyroidism, unspecified: Secondary | ICD-10-CM | POA: Diagnosis not present

## 2017-10-10 DIAGNOSIS — Z01419 Encounter for gynecological examination (general) (routine) without abnormal findings: Secondary | ICD-10-CM

## 2017-10-10 DIAGNOSIS — G43901 Migraine, unspecified, not intractable, with status migrainosus: Secondary | ICD-10-CM

## 2017-10-10 DIAGNOSIS — G4733 Obstructive sleep apnea (adult) (pediatric): Secondary | ICD-10-CM

## 2017-10-10 DIAGNOSIS — K219 Gastro-esophageal reflux disease without esophagitis: Secondary | ICD-10-CM

## 2017-10-10 LAB — MICROSCOPIC EXAMINATION
Bacteria, UA: NONE SEEN
RBC MICROSCOPIC, UA: NONE SEEN /HPF (ref 0–2)
Renal Epithel, UA: NONE SEEN /hpf

## 2017-10-10 LAB — URINALYSIS, COMPLETE
Bilirubin, UA: NEGATIVE
GLUCOSE, UA: NEGATIVE
Ketones, UA: NEGATIVE
Leukocytes, UA: NEGATIVE
NITRITE UA: NEGATIVE
PH UA: 5.5 (ref 5.0–7.5)
RBC, UA: NEGATIVE
Specific Gravity, UA: 1.025 (ref 1.005–1.030)
UUROB: 0.2 mg/dL (ref 0.2–1.0)

## 2017-10-10 MED ORDER — MOMETASONE FUROATE 50 MCG/ACT NA SUSP: 2.0000 | Freq: Every day | NASAL | 12 refills | Status: DC

## 2017-10-10 MED ORDER — LEVOTHYROXINE SODIUM 112 MCG PO TABS: ORAL_TABLET | ORAL | 5 refills | Status: DC

## 2017-10-10 MED ORDER — CLONAZEPAM 0.5 MG PO TABS: 0.5000 mg | ORAL_TABLET | Freq: Two times a day (BID) | ORAL | 0 refills | Status: DC | PRN

## 2017-10-10 MED ORDER — PROPRANOLOL HCL 80 MG PO TABS: 80.0000 mg | ORAL_TABLET | Freq: Every day | ORAL | 5 refills | Status: DC

## 2017-10-10 MED ORDER — RANITIDINE HCL 150 MG PO TABS: 150.0000 mg | ORAL_TABLET | Freq: Every day | ORAL | 3 refills | Status: DC

## 2017-10-10 MED ORDER — METOCLOPRAMIDE HCL 10 MG PO TABS: 10.0000 mg | ORAL_TABLET | Freq: Every day | ORAL | 5 refills | Status: DC

## 2017-10-10 MED ORDER — PANTOPRAZOLE SODIUM 40 MG PO TBEC: DELAYED_RELEASE_TABLET | ORAL | 5 refills | Status: DC

## 2017-10-10 NOTE — Addendum Note (Signed)
Addended by: Rolena Infante on: 10/10/2017 10:54 AM   Modules accepted: Orders

## 2017-10-10 NOTE — Patient Instructions (Signed)

## 2017-10-10 NOTE — Progress Notes (Signed)
Subjective:    Patient ID: [name] [name], female    DOB: 12/06/1970, 47 y.o.   MRN: [id number]   Chief Complaint: Annual Exam (RF oxybutynin)   HPI:  1. Annual physical exam   2. Gynecologic exam normal  Patient is on depo shot and does not have a cycle.  3. SVT (supraventricular tachycardia) (HCC)  No recent flare ups  4. Migraine with status migrainosus, not intractable, unspecified migraine type Has not had any since she started on propranolol   5. Hypothyroidism, unspecified type  No complaints  6. Panic attacks  Has had no recent attacks  7. Morbid obesity ([place])  Weight is unchanged from last visit  8. OSA (obstructive sleep apnea)  Uses CPAP nightly- cannot sleep without it.  9. Acquired hypothyroidism  Again no problems  10. Gastroesophageal reflux disease without esophagitis  [name] oon protonix, zantac and reglan. When she does not take she has bad symptoms.    Outpatient Encounter Medications as of 10/10/2017  Medication Sig  . clonazePAM (KLONOPIN) 0.5 MG tablet Take 1 tablet (0.5 mg total) by mouth 2 (two) times daily as needed.  . cyclobenzaprine (FLEXERIL) 10 MG tablet Take 1 tablet (10 mg total) by mouth 3 (three) times daily.  . fexofenadine (ALLEGRA) 180 MG tablet Take 180 mg by mouth daily.  Marland Kitchen levothyroxine (SYNTHROID, LEVOTHROID) 112 MCG tablet TAKE 1 TABLET BY MOUTH EVERY DAY BEFORE BREAKFAST  . meclizine (ANTIVERT) 25 MG tablet Take 1 tablet (25 mg total) by mouth 3 (three) times daily as needed for dizziness.  . metoCLOPramide (REGLAN) 10 MG tablet TAKE 1 TABLET BY MOUTH DAILY  . mometasone (NASONEX) 50 MCG/ACT nasal spray Place 2 sprays into the nose daily.  . naproxen (NAPROSYN) 500 MG tablet Take 500 mg by mouth 2 (two) times daily with a meal.  . ondansetron (ZOFRAN) 8 MG tablet Take 1 tablet (8 mg total) by mouth every 8 (eight) hours as needed for nausea or vomiting.  . pantoprazole (PROTONIX) 40 MG tablet TAKE ONE TABLET BY MOUTH TWICE DAILY - 30  MINUTES PRIOR TO BREAKFAST  . propranolol (INDERAL) 80 MG tablet TAKE 1 TABLET BY MOUTH DAILY  . ranitidine (ZANTAC) 150 MG tablet TAKE 1 TABLET BY MOUTH DAILY       New complaints: -urinary frequency-use to be on detrol la which helped but for some reason it as not been filled in awhile- would like to go back on meds.  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    Review of Systems  Constitutional: Negative for activity change and appetite change.  HENT: Negative.   Eyes: Negative for pain.  Respiratory: Negative for shortness of breath.   Cardiovascular: Negative for chest pain, palpitations and leg swelling.  Gastrointestinal: Negative for abdominal pain.  Endocrine: Negative for polydipsia.  Genitourinary: Negative.   Skin: Negative for rash.  Neurological: Negative for dizziness, weakness and headaches.  Hematological: Does not bruise/bleed easily.  Psychiatric/Behavioral: Negative.   All other systems reviewed and are negative.      Objective:   Physical Exam  Constitutional: [name] oriented to person, place, and time. She appears well-developed and well-nourished. No distress.  HENT:  Head: Normocephalic.  Nose: Nose normal.  Mouth/Throat: Oropharynx is clear and moist.  Eyes: Pupils are equal, round, and reactive to light. EOM are normal.  Neck: Normal range of motion. Neck supple. No JVD present. Carotid bruit is not present.  Cardiovascular: Normal rate, regular rhythm, normal  heart sounds and intact distal pulses.  Pulmonary/Chest: Effort normal and breath sounds normal. No respiratory distress. She has no wheezes. She has no rales. She exhibits no tenderness.  Abdominal: Soft. Normal appearance, normal aorta and bowel sounds are normal. She exhibits no distension, no abdominal bruit, no pulsatile midline mass and no mass. There is no splenomegaly or hepatomegaly. There is no tenderness.  Genitourinary: Vagina normal and uterus normal. No  vaginal discharge found.  Genitourinary Comments: Cervix non parous and pink  Musculoskeletal: Normal range of motion. She exhibits no edema.  Lymphadenopathy:    She has no cervical adenopathy.  Neurological: [name] alert and oriented to person, place, and time. She has normal reflexes.  Skin: Skin is warm and dry.  Psychiatric: She has a normal mood and affect. Her behavior is normal. Judgment and thought content normal.    BP 117/81   Pulse 79   Temp 98.2 F (36.8 C) (Oral)   Ht '5\' 6"'$  (1.676 m)   Wt 250 lb (113.4 kg)   BMI 40.35 kg/m        Assessment & Plan:  [name] [name] comes in today with chief complaint of Annual Exam (RF oxybutynin)   Diagnosis and orders addressed:  1. Annual physical exam boostrix today - Urinalysis, Complete - CMP14+EGFR - CBC with Differential/Platelet  2. Gynecologic exam normal - IGP, Aptima HPV, rfx 16/18,45  3. SVT (supraventricular tachycardia) (H[facility]) Avoid caffeine - clonazePAM (KLONOPIN) 0.5 MG tablet; Take 1 tablet (0.5 mg total) by mouth 2 (two) times daily as needed.  Dispense: 60 tablet; Refill: 0  4. Migraine with status migrainosus, not intractable, unspecified migraine type - propranolol (INDERAL) 80 MG tablet; Take 1 tablet (80 mg total) by mouth daily.  Dispense: 30 tablet; Refill: 5  5. Panic attacks Stress management  6. Morbid obesity ([place]) - Lipid panel  7. OSA (obstructive sleep apnea) Continue CPAP machine  8. Acquired hypothyroidism - Thyroid Panel With TSH - levothyroxine (SYNTHROID, LEVOTHROID) 112 MCG tablet; TAKE 1 TABLET BY MOUTH EVERY DAY BEFORE BREAKFAST  Dispense: 30 tablet; Refill: 5  9. Gastroesophageal reflux disease without esophagitis Avoid spicy foods Do not eat 2 hours prior to bedtime - ranitidine (ZANTAC) 150 MG tablet; Take 1 tablet (150 mg total) by mouth daily.  Dispense: 30 tablet; Refill: 3 - metoCLOPramide (REGLAN) 10 MG tablet; Take 1 tablet (10 mg total) by mouth daily.  Dispense:  30 tablet; Refill: 5 - pantoprazole (PROTONIX) 40 MG tablet; TAKE ONE TABLET BY MOUTH TWICE DAILY - 30 MINUTES PRIOR TO BREAKFAST  Dispense: 60 tablet; Refill: 5   Labs pending Health Maintenance reviewed Diet and exercise encouraged  Follow up plan: 6 months   Mary-Margaret Hassell Done, FNP

## 2017-10-11 LAB — CMP14+EGFR
ALT: 24 IU/L (ref 0–32)
AST: 25 IU/L (ref 0–40)
Albumin/Globulin Ratio: 1.6 (ref 1.2–2.2)
Albumin: 4.4 g/dL (ref 3.5–5.5)
Alkaline Phosphatase: 78 IU/L (ref 39–117)
BUN/Creatinine Ratio: 13 (ref 9–23)
BUN: 11 mg/dL (ref 6–24)
Bilirubin Total: 0.4 mg/dL (ref 0.0–1.2)
CO2: 21 mmol/L (ref 20–29)
Calcium: 9.8 mg/dL (ref 8.7–10.2)
Chloride: 106 mmol/L (ref 96–106)
Creatinine, Ser: 0.83 mg/dL (ref 0.57–1.00)
GFR calc Af Amer: 98 mL/min/{1.73_m2} (ref >59)
GFR calc non Af Amer: 85 mL/min/{1.73_m2} (ref >59)
Globulin, Total: 2.8 g/dL (ref 1.5–4.5)
Glucose: 99 mg/dL (ref 65–99)
Potassium: 4.6 mmol/L (ref 3.5–5.2)
Sodium: 143 mmol/L (ref 134–144)
Total Protein: 7.2 g/dL (ref 6.0–8.5)

## 2017-10-11 LAB — CBC WITH DIFFERENTIAL/PLATELET
BASOS ABS: 0.1 10*3/uL (ref 0.0–0.2)
Basos: 1 %
EOS (ABSOLUTE): 0.3 10*3/uL (ref 0.0–0.4)
Eos: 2 %
Hematocrit: 45.2 % (ref 34.0–46.6)
Hemoglobin: 14.5 g/dL (ref 11.1–15.9)
IMMATURE GRANULOCYTES: 0 %
Immature Grans (Abs): 0 10*3/uL (ref 0.0–0.1)
Lymphocytes Absolute: 3.1 10*3/uL (ref 0.7–3.1)
Lymphs: 28 %
MCH: 28.4 pg (ref 26.6–33.0)
MCHC: 32.1 g/dL (ref 31.5–35.7)
MCV: 89 fL (ref 79–97)
MONOS ABS: 0.7 10*3/uL (ref 0.1–0.9)
Monocytes: 7 %
NEUTROS PCT: 62 %
Neutrophils Absolute: 6.8 10*3/uL (ref 1.4–7.0)
Platelets: 325 10*3/uL (ref 150–450)
RBC: 5.1 x10E6/uL (ref 3.77–5.28)
RDW: 14 % (ref 12.3–15.4)
WBC: 11 10*3/uL — AB (ref 3.4–10.8)

## 2017-10-11 LAB — LIPID PANEL
Chol/HDL Ratio: 4.4 ratio (ref 0.0–4.4)
Cholesterol, Total: 145 mg/dL (ref 100–199)
HDL: 33 mg/dL — ABNORMAL LOW (ref >39)
LDL Calculated: 76 mg/dL (ref 0–99)
Triglycerides: 180 mg/dL — ABNORMAL HIGH (ref 0–149)
VLDL Cholesterol Cal: 36 mg/dL (ref 5–40)

## 2017-10-11 LAB — THYROID PANEL WITH TSH
Free Thyroxine Index: 2.1 (ref 1.2–4.9)
T3 Uptake Ratio: 26 % (ref 24–39)
T4 TOTAL: 7.9 ug/dL (ref 4.5–12.0)
TSH: 1.99 u[IU]/mL (ref 0.450–4.500)

## 2017-10-14 ENCOUNTER — Telehealth (INDEPENDENT_AMBULATORY_CARE_PROVIDER_SITE_OTHER): Payer: Self-pay

## 2017-10-14 NOTE — Telephone Encounter (Signed)
Provided pts surgery date and post op date as well as cpt and dx code to Theodoro Doing who needed this info to pay pt for her time out of work

## 2017-10-15 LAB — IGP, APTIMA HPV, RFX 16/18,45
HPV APTIMA: NEGATIVE
PAP SMEAR COMMENT: 0

## 2017-10-17 DIAGNOSIS — M2242 Chondromalacia patellae, left knee: Secondary | ICD-10-CM

## 2017-10-17 DIAGNOSIS — M23322 Other meniscus derangements, posterior horn of medial meniscus, left knee: Secondary | ICD-10-CM | POA: Diagnosis not present

## 2017-10-17 DIAGNOSIS — M23222 Derangement of posterior horn of medial meniscus due to old tear or injury, left knee: Secondary | ICD-10-CM | POA: Diagnosis not present

## 2017-10-17 DIAGNOSIS — G8918 Other acute postprocedural pain: Secondary | ICD-10-CM | POA: Diagnosis not present

## 2017-10-21 ENCOUNTER — Telehealth (INDEPENDENT_AMBULATORY_CARE_PROVIDER_SITE_OTHER): Payer: Self-pay | Admitting: Orthopaedic Surgery

## 2017-10-21 NOTE — Telephone Encounter (Signed)
Please advisel

## 2017-10-21 NOTE — Telephone Encounter (Signed)
Patient called stating she has her discharge papers and she has a couple of questions regarding showering after surgery.

## 2017-10-21 NOTE — Telephone Encounter (Signed)
called

## 2017-10-23 ENCOUNTER — Ambulatory Visit (INDEPENDENT_AMBULATORY_CARE_PROVIDER_SITE_OTHER): Payer: 59 | Admitting: Orthopaedic Surgery

## 2017-10-23 ENCOUNTER — Encounter (INDEPENDENT_AMBULATORY_CARE_PROVIDER_SITE_OTHER): Payer: Self-pay | Admitting: Orthopaedic Surgery

## 2017-10-23 VITALS — BP 113/71 | HR 95 | Ht 66.0 in | Wt 250.0 lb

## 2017-10-23 DIAGNOSIS — G8929 Other chronic pain: Secondary | ICD-10-CM

## 2017-10-23 DIAGNOSIS — M25562 Pain in left knee: Secondary | ICD-10-CM

## 2017-10-23 NOTE — Progress Notes (Signed)
Office Visit Note   Patient: [name] [name]           Date of Birth: 17-Mar-1970           MRN: [id number] Visit Date: 10/23/2017              Requested by: Chevis Pretty, [address], [place] [address] PCP: Chevis Pretty, FNP   Assessment & Plan: Visit Diagnoses:  1. Chronic pain of left knee     Plan: 6 days status post left knee arthroscopy with a tear of the medial meniscus and some degenerative change.  Doing well.  Dressing removed Band-Aids applied.  No evidence of infection or DVT.  Increase activities as tolerated and return in 2 weeks.  No work  Follow-Up Instructions: Return in about 2 weeks (around 11/06/2017).   Orders:  No orders of the defined types were placed in this encounter.  No orders of the defined types were placed in this encounter.     Procedures: No procedures performed   Clinical Data: No additional findings.   Subjective: Chief Complaint  Patient presents with  . Follow-up    10/17/17 L KNEE ARTH. GOING GOOD HAS SOME PAIN.  Doing well without complications.  HPI  Review of Systems  Constitutional: Negative for fatigue and fever.  HENT: Negative for ear pain.   Respiratory: Negative for cough and shortness of breath.   Cardiovascular: Positive for leg swelling.  Gastrointestinal: Positive for constipation. Negative for diarrhea.  Genitourinary: Negative for difficulty urinating.  Musculoskeletal: Negative for back pain and neck pain.  Skin: Negative for rash.  Allergic/Immunologic: Negative for food allergies.  Neurological: Positive for weakness. Negative for numbness.  Hematological: Does not bruise/bleed easily.  Psychiatric/Behavioral: Positive for suicidal ideas.     Objective: Vital Signs: BP 113/71 (BP Location: Left Arm, Patient Position: Sitting, Cuff Size: Normal)   Pulse 95   Ht 5\' 6"  (1.676 m)   Wt 250 lb (113.4 kg)   BMI 40.35 kg/m   Physical Exam  Ortho Exam left knee dressing  was removed.  Arthroscopic portals healing without problem.  New Band-Aids applied.  No effusion.  No calf pain.  No distal edema.  Full extension and flexion over 105 degrees.  No instability.  Mild medial joint pain as expected  Specialty Comments:  No specialty comments available.  Imaging: No results found.   PMFS History: Patient Active Problem List   Diagnosis Date Noted  . Hidradenitis suppurativa 10/01/2016  . OSA (obstructive sleep apnea) 05/13/2015  . Morbid obesity ([place]) 05/13/2015  . Lumbago 11/11/2014  . Hypothyroidism 03/27/2014  . Panic attacks 03/27/2014  . SVT (supraventricular tachycardia) ([place]) 06/26/2012  . Migraines 06/26/2012   Past Medical History:  Diagnosis Date  . Allergy    SEASONAL  . Anxiety   . Chronic headaches   . Colon polyps   . GERD (gastroesophageal reflux disease)   . History of kidney stones   . Hyperlipidemia   . Hypothyroidism   . Palpitations   . Sleep apnea     Family History  Problem Relation Age of Onset  . Heart disease Maternal Grandfather   . Heart disease Maternal Grandmother   . Lung cancer Father        died from  . Breast cancer Paternal Grandmother        paternal aunts x2  . Hypertension Mother   . Colon cancer Neg Hx   . Esophageal cancer Neg Hx   .  Rectal cancer Neg Hx   . Stomach cancer Neg Hx     Past Surgical History:  Procedure Laterality Date  . CESAREAN SECTION     1194  . COLONOSCOPY    . KNEE ARTHROSCOPY    . LITHOTRIPSY  12/2014   Social History   Occupational History    Employer: [employer]  Tobacco Use  . Smoking status: Current Every Day Smoker    Packs/day: 0.50    Years: 7.00    Pack years: 3.50    Types: Cigarettes    Start date: 10/10/2016  . Smokeless tobacco: Never Used  . Tobacco comment: patient has patches but has not began yet, has program through her employer  Substance and Sexual Activity  . Alcohol use: Yes    Alcohol/week: 0.0 standard drinks    Comment: rare   . Drug use: No  . Sexual activity: Not on file

## 2017-10-30 ENCOUNTER — Other Ambulatory Visit: Payer: Self-pay | Admitting: Nurse Practitioner

## 2017-10-30 DIAGNOSIS — M545 Low back pain, unspecified: Secondary | ICD-10-CM

## 2017-11-04 ENCOUNTER — Other Ambulatory Visit: Payer: Self-pay | Admitting: Nurse Practitioner

## 2017-11-04 DIAGNOSIS — I471 Supraventricular tachycardia: Secondary | ICD-10-CM

## 2017-11-04 NOTE — Telephone Encounter (Signed)
Last seen 10/10/17 MMM

## 2017-11-06 ENCOUNTER — Other Ambulatory Visit (INDEPENDENT_AMBULATORY_CARE_PROVIDER_SITE_OTHER): Payer: Self-pay | Admitting: Radiology

## 2017-11-06 ENCOUNTER — Encounter (INDEPENDENT_AMBULATORY_CARE_PROVIDER_SITE_OTHER): Payer: Self-pay | Admitting: Orthopaedic Surgery

## 2017-11-06 ENCOUNTER — Ambulatory Visit (INDEPENDENT_AMBULATORY_CARE_PROVIDER_SITE_OTHER): Payer: 59 | Admitting: Orthopaedic Surgery

## 2017-11-06 VITALS — BP 152/82 | HR 109 | Ht 66.0 in | Wt 250.0 lb

## 2017-11-06 DIAGNOSIS — M25562 Pain in left knee: Secondary | ICD-10-CM

## 2017-11-06 DIAGNOSIS — G8929 Other chronic pain: Secondary | ICD-10-CM

## 2017-11-06 NOTE — Progress Notes (Signed)
Office Visit Note   Patient: [name] [name]           Date of Birth: 1970-12-18           MRN: [id number] Visit Date: 11/06/2017              Requested by: Chevis Pretty, [address] [place], [place] [address] PCP: Chevis Pretty, FNP   Assessment & Plan: Visit Diagnoses:  1. Chronic pain of left knee     Plan: 3 weeks status post arthroscopy left knee with a tear of the medial meniscus and some degenerative changes.  Actually doing well.  Walks without ambulatory aid.  No fever or chills.  Has some concerns about returning to work 12 hours a day.  I would agree.  We will keep her out of work and reevaluate in 2 weeks.  Will provide with handicap parking application  Follow-Up Instructions: Return in about 2 weeks (around 11/20/2017).   Orders:  No orders of the defined types were placed in this encounter.  No orders of the defined types were placed in this encounter.     Procedures: No procedures performed   Clinical Data: No additional findings.   Subjective: Chief Complaint  Patient presents with  . Follow-up    10/19/17 HAD SURGERY DOING OK STILL HAVING SOME PAIN AND STIFFNESS, DRIVING MAKESIT WORSE WOULD LIEK TO EXTEND WORK NOTE 2 MORE WEEKS  Still having some discomfort in her left knee 3 weeks post arthroscopic debridement of a medial meniscal tear and some degenerative changes.  Actually doing fairly well without complications.  Not using any ambulatory aid or pain medicine.  Her job requires her to be up and down over 12 hours during the course of her day.  I do not think she is ready for that yet  HPI  Review of Systems  Constitutional: Negative for fatigue and fever.  HENT: Negative for ear pain.   Eyes: Negative for pain.  Respiratory: Negative for cough and shortness of breath.   Cardiovascular: Positive for leg swelling.  Gastrointestinal: Negative for constipation and diarrhea.  Genitourinary: Negative for difficulty urinating.    Musculoskeletal: Negative for back pain and neck pain.  Skin: Negative for rash.  Allergic/Immunologic: Negative for food allergies.  Neurological: Positive for weakness. Negative for numbness.  Hematological: Does not bruise/bleed easily.  Psychiatric/Behavioral: Negative for sleep disturbance.     Objective: Vital Signs: BP (!) 152/82 (BP Location: Left Arm, Patient Position: Sitting, Cuff Size: Normal)   Pulse (!) 109   Ht 5\' 6"  (1.676 m)   Wt 250 lb (113.4 kg)   BMI 40.35 kg/m   Physical Exam  Ortho Exam knee exam with well healed arthroscopic portals.  No knee effusion.  Mild medial lateral joint pain.  Full extension over 100 degrees of flexion without instability.  No popliteal pain.  No calf pain.  No distal edema.  Neurovascular exam intact.  Not walking with a limp  Specialty Comments:  No specialty comments available.  Imaging: No results found.   PMFS History: Patient Active Problem List   Diagnosis Date Noted  . Hidradenitis suppurativa 10/01/2016  . OSA (obstructive sleep apnea) 05/13/2015  . Morbid obesity ([facility]) 05/13/2015  . Lumbago 11/11/2014  . Hypothyroidism 03/27/2014  . Panic attacks 03/27/2014  . SVT (supraventricular tachycardia) ([facility]) 06/26/2012  . Migraines 06/26/2012   Past Medical History:  Diagnosis Date  . Allergy    SEASONAL  . Anxiety   . Chronic headaches   .  Colon polyps   . GERD (gastroesophageal reflux disease)   . History of kidney stones   . Hyperlipidemia   . Hypothyroidism   . Palpitations   . Sleep apnea     Family History  Problem Relation Age of Onset  . Heart disease Maternal Grandfather   . Heart disease Maternal Grandmother   . Lung cancer Father        died from  . Breast cancer Paternal Grandmother        paternal aunts x2  . Hypertension Mother   . Colon cancer Neg Hx   . Esophageal cancer Neg Hx   . Rectal cancer Neg Hx   . Stomach cancer Neg Hx     Past Surgical History:  Procedure Laterality Date   . CESAREAN SECTION     1194  . COLONOSCOPY    . KNEE ARTHROSCOPY    . LITHOTRIPSY  12/2014   Social History   Occupational History    Employer: [employer]  Tobacco Use  . Smoking status: Current Every Day Smoker    Packs/day: 0.50    Years: 7.00    Pack years: 3.50    Types: Cigarettes    Start date: 10/10/2016  . Smokeless tobacco: Never Used  . Tobacco comment: patient has patches but has not began yet, has program through her employer  Substance and Sexual Activity  . Alcohol use: Yes    Alcohol/week: 0.0 standard drinks    Comment: rare  . Drug use: No  . Sexual activity: Not on file

## 2017-11-10 ENCOUNTER — Other Ambulatory Visit: Payer: Self-pay | Admitting: Nurse Practitioner

## 2017-11-10 DIAGNOSIS — M545 Low back pain, unspecified: Secondary | ICD-10-CM

## 2017-11-20 ENCOUNTER — Ambulatory Visit (INDEPENDENT_AMBULATORY_CARE_PROVIDER_SITE_OTHER): Payer: 59 | Admitting: Orthopaedic Surgery

## 2017-11-20 ENCOUNTER — Encounter (INDEPENDENT_AMBULATORY_CARE_PROVIDER_SITE_OTHER): Payer: Self-pay | Admitting: Orthopaedic Surgery

## 2017-11-20 VITALS — BP 117/76 | HR 91 | Ht 66.0 in | Wt 250.0 lb

## 2017-11-20 DIAGNOSIS — M25562 Pain in left knee: Secondary | ICD-10-CM

## 2017-11-20 DIAGNOSIS — G8929 Other chronic pain: Secondary | ICD-10-CM

## 2017-11-20 NOTE — Progress Notes (Signed)
Office Visit Note   Patient: [name] [name]           Date of Birth: 1970-11-04           MRN: [id number] Visit Date: 11/20/2017              Requested by: Chevis Pretty, [address] [place], [place] [address] PCP: Chevis Pretty, FNP   Assessment & Plan: Visit Diagnoses:  1. Chronic pain of left knee     Plan: still some pain 5 weeks post left knee partial med menisectomy. OOW for 2 weeks. Continue with exercises  Follow-Up Instructions: Return in about 2 weeks (around 12/04/2017).   Orders:  No orders of the defined types were placed in this encounter.  No orders of the defined types were placed in this encounter.     Procedures: No procedures performed   Clinical Data: No additional findings.   Subjective: Chief Complaint  Patient presents with  . Follow-up    10/17/17 L KNEE STILL HAVING PAIN AND SWELLING, DIFFICULT WALKING AND MAY NEED MORE TIME OFF OF WORK    HPI  Review of Systems  Constitutional: Positive for fatigue.  HENT: Negative for ear pain.   Eyes: Negative for pain.  Respiratory: Negative for cough and shortness of breath.   Cardiovascular: Positive for leg swelling.  Gastrointestinal: Positive for diarrhea. Negative for constipation.  Genitourinary: Negative for difficulty urinating.  Musculoskeletal: Negative for back pain and neck pain.  Skin: Negative for rash.  Allergic/Immunologic: Negative for food allergies.  Neurological: Positive for weakness. Negative for numbness.  Hematological: Does not bruise/bleed easily.  Psychiatric/Behavioral: Positive for sleep disturbance.     Objective: Vital Signs: BP 117/76 (BP Location: Right Arm, Patient Position: Sitting, Cuff Size: Normal)   Pulse 91   Ht 5\' 6"  (1.676 m)   Wt 250 lb (113.4 kg)   BMI 40.35 kg/m   Physical Exam  Ortho Examleft knee without effusion. FROM compared to right. No distal edema. N/v intact  Specialty Comments:  No specialty comments  available.  Imaging: No results found.   PMFS History: Patient Active Problem List   Diagnosis Date Noted  . Hidradenitis suppurativa 10/01/2016  . OSA (obstructive sleep apnea) 05/13/2015  . Morbid obesity ([facility]) 05/13/2015  . Lumbago 11/11/2014  . Hypothyroidism 03/27/2014  . Panic attacks 03/27/2014  . SVT (supraventricular tachycardia) ([facility]) 06/26/2012  . Migraines 06/26/2012   Past Medical History:  Diagnosis Date  . Allergy    SEASONAL  . Anxiety   . Chronic headaches   . Colon polyps   . GERD (gastroesophageal reflux disease)   . History of kidney stones   . Hyperlipidemia   . Hypothyroidism   . Palpitations   . Sleep apnea     Family History  Problem Relation Age of Onset  . Heart disease Maternal Grandfather   . Heart disease Maternal Grandmother   . Lung cancer Father        died from  . Breast cancer Paternal Grandmother        paternal aunts x2  . Hypertension Mother   . Colon cancer Neg Hx   . Esophageal cancer Neg Hx   . Rectal cancer Neg Hx   . Stomach cancer Neg Hx     Past Surgical History:  Procedure Laterality Date  . CESAREAN SECTION     1194  . COLONOSCOPY    . KNEE ARTHROSCOPY    . LITHOTRIPSY  12/2014   Social  History   Occupational History    Employer: [employer]  Tobacco Use  . Smoking status: Current Every Day Smoker    Packs/day: 0.50    Years: 7.00    Pack years: 3.50    Types: Cigarettes    Start date: 10/10/2016  . Smokeless tobacco: Never Used  . Tobacco comment: patient has patches but has not began yet, has program through her employer  Substance and Sexual Activity  . Alcohol use: Yes    Alcohol/week: 0.0 standard drinks    Comment: rare  . Drug use: No  . Sexual activity: Not on file

## 2017-12-04 ENCOUNTER — Ambulatory Visit (INDEPENDENT_AMBULATORY_CARE_PROVIDER_SITE_OTHER): Payer: 59 | Admitting: Orthopaedic Surgery

## 2017-12-04 ENCOUNTER — Encounter (INDEPENDENT_AMBULATORY_CARE_PROVIDER_SITE_OTHER): Payer: Self-pay | Admitting: Orthopaedic Surgery

## 2017-12-04 DIAGNOSIS — M25562 Pain in left knee: Secondary | ICD-10-CM

## 2017-12-04 DIAGNOSIS — G8929 Other chronic pain: Secondary | ICD-10-CM

## 2017-12-04 NOTE — Progress Notes (Signed)
Office Visit Note   Patient: [name] [name]           Date of Birth: 1970/06/08           MRN: [id number] Visit Date: 12/04/2017              Requested by: Chevis Pretty, [address] [place], [place] [address] PCP: Chevis Pretty, FNP   Assessment & Plan: Visit Diagnoses:  1. Chronic pain of left knee     Plan: 6 weeks status post left knee arthroscopy with partial medial meniscectomy.  Doing well.  Occasional ache or pain.  Minimal chondromalacia in all 3 compartments.  We will give a note to return to work tomorrow without restrictions.  Office 1 month  Follow-Up Instructions: No follow-ups on file.   Orders:  No orders of the defined types were placed in this encounter.  No orders of the defined types were placed in this encounter.     Procedures: No procedures performed   Clinical Data: No additional findings.   Subjective: Chief Complaint  Patient presents with  . Follow-up    10/17/17 LEFT KNEE SCOPE  Ms Fare that she is doing well 6 weeks status post left knee arthroscopy with a partial medial meniscectomy.  Occasionally has some aches and pains but no limp no use of ambulatory aid.  Would like to return to work tomorrow without restrictions.  HPI  Review of Systems   Objective: Vital Signs: There were no vitals taken for this visit.  Physical Exam  Ortho Exam left knee arthroscopic portals healing without problem.  Nontender.  No drainage.  No effusion.  Full quick extension and flexion of the left knee.  No instability.  No significant medial lateral joint pain.  No distal edema.  Vascular exam intact  Specialty Comments:  No specialty comments available.  Imaging: No results found.   PMFS History: Patient Active Problem List   Diagnosis Date Noted  . Hidradenitis suppurativa 10/01/2016  . OSA (obstructive sleep apnea) 05/13/2015  . Morbid obesity ([facility]) 05/13/2015  . Lumbago 11/11/2014  . Hypothyroidism 03/27/2014  .  Panic attacks 03/27/2014  . SVT (supraventricular tachycardia) ([facility]) 06/26/2012  . Migraines 06/26/2012   Past Medical History:  Diagnosis Date  . Allergy    SEASONAL  . Anxiety   . Chronic headaches   . Colon polyps   . GERD (gastroesophageal reflux disease)   . History of kidney stones   . Hyperlipidemia   . Hypothyroidism   . Palpitations   . Sleep apnea     Family History  Problem Relation Age of Onset  . Heart disease Maternal Grandfather   . Heart disease Maternal Grandmother   . Lung cancer Father        died from  . Breast cancer Paternal Grandmother        paternal aunts x2  . Hypertension Mother   . Colon cancer Neg Hx   . Esophageal cancer Neg Hx   . Rectal cancer Neg Hx   . Stomach cancer Neg Hx     Past Surgical History:  Procedure Laterality Date  . CESAREAN SECTION     1194  . COLONOSCOPY    . KNEE ARTHROSCOPY    . LITHOTRIPSY  12/2014   Social History   Occupational History    Employer: [employer]  Tobacco Use  . Smoking status: Current Every Day Smoker    Packs/day: 0.50    Years: 7.00  Pack years: 3.50    Types: Cigarettes    Start date: 10/10/2016  . Smokeless tobacco: Never Used  . Tobacco comment: patient has patches but has not began yet, has program through her employer  Substance and Sexual Activity  . Alcohol use: Yes    Alcohol/week: 0.0 standard drinks    Comment: rare  . Drug use: No  . Sexual activity: Not on file     Garald Balding, MD   Note - This record has been created using [employer].  Chart creation errors have been sought, but may not always  have been located. Such creation errors do not reflect on  the standard of medical care.

## 2017-12-31 DIAGNOSIS — Z1231 Encounter for screening mammogram for malignant neoplasm of breast: Secondary | ICD-10-CM | POA: Diagnosis not present

## 2017-12-31 LAB — HM MAMMOGRAPHY

## 2018-01-10 ENCOUNTER — Encounter: Payer: Self-pay | Admitting: Pediatrics

## 2018-01-10 ENCOUNTER — Ambulatory Visit: Payer: 59 | Admitting: Pediatrics

## 2018-01-10 VITALS — BP 114/79 | HR 76 | Temp 97.8°F | Ht 66.0 in | Wt 256.0 lb

## 2018-01-10 DIAGNOSIS — B379 Candidiasis, unspecified: Secondary | ICD-10-CM | POA: Diagnosis not present

## 2018-01-10 DIAGNOSIS — M545 Low back pain, unspecified: Secondary | ICD-10-CM

## 2018-01-10 DIAGNOSIS — N3289 Other specified disorders of bladder: Secondary | ICD-10-CM | POA: Diagnosis not present

## 2018-01-10 DIAGNOSIS — H65112 Acute and subacute allergic otitis media (mucoid) (sanguinous) (serous), left ear: Secondary | ICD-10-CM

## 2018-01-10 MED ORDER — CYCLOBENZAPRINE HCL 10 MG PO TABS: 10.0000 mg | ORAL_TABLET | Freq: Three times a day (TID) | ORAL | 2 refills | Status: DC

## 2018-01-10 MED ORDER — OXYBUTYNIN CHLORIDE ER 5 MG PO TB24: 5.0000 mg | ORAL_TABLET | Freq: Every day | ORAL | 1 refills | Status: DC

## 2018-01-10 MED ORDER — AMOXICILLIN 875 MG PO TABS: 875.0000 mg | ORAL_TABLET | Freq: Two times a day (BID) | ORAL | 0 refills | Status: DC

## 2018-01-10 MED ORDER — FLUCONAZOLE 150 MG PO TABS: 150.0000 mg | ORAL_TABLET | ORAL | 0 refills | Status: DC | PRN

## 2018-01-10 NOTE — Progress Notes (Signed)
  Subjective:   Patient ID: [name] [name], female    DOB: 12-12-1970, 47 y.o.   MRN: [id number] CC: Sinusitis  HPI: [name] [name] is a 47 y.o. female   Started getting sick 5 days ago. Started getting hot yesterday, but may be a hot flash, no fevers that she knows of.  Appetite has been slightly down.  Drinking lots of fluids.  Trying to get lots of rest.  Minimal coughing.  Sinus drainage and ear pain bothering her the most.  Left ear more than right ear.  Taking allegra, nasonex, aleve.   Bladder spasms: Was seen by urology, told she is not feeling her bladder normally, will start her oxybutynin.  It has been helping a lot with her symptoms.  She takes it once a day.  Back spasms and neck pain: Taking Flexeril once a day at night.  Helps her relax her muscles.  Does not cause any sleepiness.  Does not take during the day.  Relevant past medical, surgical, family and social history reviewed. Allergies and medications reviewed and updated.  ROS: Per HPI   Objective:    BP 114/79   Pulse 76   Temp 97.8 F (36.6 C) (Oral)   Ht 5\' 6"  (1.676 m)   Wt 256 lb (116.1 kg)   BMI 41.32 kg/m   Wt Readings from Last 3 Encounters:  01/10/18 256 lb (116.1 kg)  11/20/17 250 lb (113.4 kg)  11/06/17 250 lb (113.4 kg)    Gen: NAD, alert, cooperative with exam, NCAT EYES: EOMI, no conjunctival injection, or no icterus ENT: Right TM with yellow effusion, left TM red with yellow effusion, OP without erythema LYMPH: no cervical LAD CV: NRRR, normal S1/S2, no murmur, distal pulses 2+ b/l Resp: CTABL, no wheezes, normal WOB Ext: No edema, warm Neuro: Alert and oriented, strength equal b/l UE and LE, coordination grossly normal MSK: normal muscle bulk  Assessment & Plan:  Myracle was seen today for sinusitis.  Diagnoses and all orders for this visit:  Acute mucoid otitis media of left ear We will treat with below based on symptoms and exam.  Symptomatic care discussed.  Sinus congestion may take  another week or so to resolve if caused primarily by a virus.  Return precautions discussed. -     amoxicillin (AMOXIL) 875 MG tablet; Take 1 tablet (875 mg total) by mouth 2 (two) times daily.  Acute bilateral low back pain without sciatica Stable, continue below as needed.  Do not take with other medicines that can cause sleepiness. -     cyclobenzaprine (FLEXERIL) 10 MG tablet; Take 1 tablet (10 mg total) by mouth 3 (three) times daily.  Bladder spasm Stable, continue below -     oxybutynin (DITROPAN-XL) 5 MG 24 hr tablet; Take 1 tablet (5 mg total) by mouth at bedtime.  Yeast infection Start below if needed with the antibiotic.  Often gets yeast infections with antibiotics. -     fluconazole (DIFLUCAN) 150 MG tablet; Take 1 tablet (150 mg total) by mouth every three (3) days as needed.   Follow up plan: Return if symptoms worsen or fail to improve. Assunta Found, MD [facility]

## 2018-01-13 ENCOUNTER — Ambulatory Visit (INDEPENDENT_AMBULATORY_CARE_PROVIDER_SITE_OTHER): Payer: 59 | Admitting: Otolaryngology

## 2018-01-13 DIAGNOSIS — H6983 Other specified disorders of Eustachian tube, bilateral: Secondary | ICD-10-CM

## 2018-01-13 DIAGNOSIS — H9012 Conductive hearing loss, unilateral, left ear, with unrestricted hearing on the contralateral side: Secondary | ICD-10-CM | POA: Diagnosis not present

## 2018-01-14 DIAGNOSIS — L03031 Cellulitis of right toe: Secondary | ICD-10-CM | POA: Diagnosis not present

## 2018-01-14 DIAGNOSIS — L600 Ingrowing nail: Secondary | ICD-10-CM | POA: Diagnosis not present

## 2018-01-14 DIAGNOSIS — M79671 Pain in right foot: Secondary | ICD-10-CM | POA: Diagnosis not present

## 2018-01-19 ENCOUNTER — Other Ambulatory Visit: Payer: Self-pay | Admitting: Nurse Practitioner

## 2018-01-19 DIAGNOSIS — I471 Supraventricular tachycardia: Secondary | ICD-10-CM

## 2018-02-03 ENCOUNTER — Ambulatory Visit (INDEPENDENT_AMBULATORY_CARE_PROVIDER_SITE_OTHER): Payer: Self-pay | Admitting: Otolaryngology

## 2018-02-04 DIAGNOSIS — M79671 Pain in right foot: Secondary | ICD-10-CM | POA: Diagnosis not present

## 2018-02-04 DIAGNOSIS — L03031 Cellulitis of right toe: Secondary | ICD-10-CM | POA: Diagnosis not present

## 2018-02-04 DIAGNOSIS — L600 Ingrowing nail: Secondary | ICD-10-CM | POA: Diagnosis not present

## 2018-02-11 DIAGNOSIS — M79672 Pain in left foot: Secondary | ICD-10-CM | POA: Diagnosis not present

## 2018-02-11 DIAGNOSIS — L03032 Cellulitis of left toe: Secondary | ICD-10-CM | POA: Diagnosis not present

## 2018-02-11 DIAGNOSIS — L600 Ingrowing nail: Secondary | ICD-10-CM | POA: Diagnosis not present

## 2018-02-16 ENCOUNTER — Other Ambulatory Visit: Payer: Self-pay | Admitting: Nurse Practitioner

## 2018-02-16 DIAGNOSIS — I471 Supraventricular tachycardia: Secondary | ICD-10-CM

## 2018-02-17 NOTE — Telephone Encounter (Signed)
Last seen 01/10/18  MMM

## 2018-02-27 ENCOUNTER — Telehealth: Payer: Self-pay | Admitting: Nurse Practitioner

## 2018-02-27 ENCOUNTER — Telehealth: Payer: Self-pay | Admitting: Pulmonary Disease

## 2018-02-27 DIAGNOSIS — K219 Gastro-esophageal reflux disease without esophagitis: Secondary | ICD-10-CM

## 2018-02-27 DIAGNOSIS — Z3042 Encounter for surveillance of injectable contraceptive: Secondary | ICD-10-CM | POA: Diagnosis not present

## 2018-02-27 NOTE — Telephone Encounter (Signed)
Pt has not been seen at office since 09/04/16. Called and spoke with pt letting her that since it has been over a year since we have seen her, stated to her that we are unable to do an Rx for supplies since it had been over a year since we had seen her. Stated to her she needed to come in for an appt.  Pt expressed understanding. Scheduled pt an OV with VS tomorrow, 12/20 at 4:15. Nothing further needed.

## 2018-02-28 ENCOUNTER — Encounter: Payer: Self-pay | Admitting: Pulmonary Disease

## 2018-02-28 ENCOUNTER — Ambulatory Visit: Payer: 59 | Admitting: Pulmonary Disease

## 2018-02-28 VITALS — BP 120/60 | HR 96 | Ht 66.0 in | Wt 252.2 lb

## 2018-02-28 DIAGNOSIS — Z9989 Dependence on other enabling machines and devices: Secondary | ICD-10-CM | POA: Diagnosis not present

## 2018-02-28 DIAGNOSIS — G4733 Obstructive sleep apnea (adult) (pediatric): Secondary | ICD-10-CM

## 2018-02-28 DIAGNOSIS — G4730 Sleep apnea, unspecified: Secondary | ICD-10-CM

## 2018-02-28 DIAGNOSIS — E669 Obesity, unspecified: Secondary | ICD-10-CM

## 2018-02-28 DIAGNOSIS — Z720 Tobacco use: Secondary | ICD-10-CM | POA: Diagnosis not present

## 2018-02-28 MED ORDER — OMEPRAZOLE 40 MG PO CPDR: 40.0000 mg | DELAYED_RELEASE_CAPSULE | Freq: Every day | ORAL | 3 refills | Status: DC

## 2018-02-28 NOTE — Patient Instructions (Signed)
Will arrange for replacement CPAP mask and supplies  Get form from your power company and bring to our office to complete  Follow up in 1 year

## 2018-02-28 NOTE — Progress Notes (Signed)
Pulmonary, Critical Care, and Sleep Medicine  Chief Complaint  Patient presents with  . Follow-up    pt is wearing cpap avg 8hr nightly-feels pressure & mask are okay. [id number]     Constitutional:  BP 120/60 (BP Location: Left Arm, Cuff Size: Normal)   Pulse 96   Ht 5\' 6"  (1.676 m)   Wt 252 lb 3.2 oz (114.4 kg)   SpO2 94%   BMI 40.71 kg/m   Past Medical History:  Hypothyroidism, Anxiety, Palpitations, HA, HLD, Allergies, GERD  Brief Summary:  [name] [name] is a 46 y.o. female with obstructive sleep apnea.  She has been doing well with CPAP.  Can't sleep w/o it.  No issue with mask fit.  Not having sinus congestion, sore throat, dry mouth, or aerophagia.  She started smoking again.  [name] planning to quit again after the New Year.  [name] going to try nicotine patch > can get these from work.  [name] going to join the gym and start pool exercises.   Physical Exam:   Appearance - well kempt   ENMT - clear nasal mucosa, midline nasal  septum, no oral exudates, no LAN, trachea midline  Respiratory - normal chest wall, normal respiratory effort, no accessory muscle use, no wheeze/rales  CV - s1s2 regular rate and rhythm, no murmurs, no peripheral edema, radial pulses symmetric  GI - soft, non tender, no masses  Lymph - no adenopathy noted in neck and axillary areas  MSK - normal gait  Ext - no cyanosis, clubbing, or joint inflammation noted  Skin - no rashes, lesions, or ulcers  Neuro - normal strength, oriented x 3  Psych - normal mood and affect   Assessment/Plan:   Obstructive sleep apnea. - she is compliant with CPAP and reports benefit from therapy - continue auto CPAP - advise her to get form from power company in case her electricity goes out  Obesity. - she is going to start water exercises  Tobacco abuse. - she is going to try nicotine replacement   Patient Instructions  Will arrange for replacement CPAP mask and  supplies  Get form from your power company and bring to our office to complete  Follow up in 1 year    Chesley Mires, MD [place] [facility] Pager: [phone] 02/28/2018, 4:46 PM  Flow Sheet    Sleep tests:  HST 03/02/15 >> AHI 92.2, SaO2 low 47%. Auto CPAP 01/30/18 to 02/28/18 >> used on 30 of 30 nights with average 9 hrs 33 min.  Average AHI 0.2 with median CPAP 10 and 95 th percentile CPAP 13 cm H2O.  Medications:   Allergies as of 02/28/2018   No Known Allergies     Medication List       Accurate as of February 28, 2018  4:46 PM. Always use your most recent med list.        clonazePAM 0.5 MG tablet Commonly known as:  KLONOPIN TAKE 1 TABLET BY MOUTH TWICE DAILY AS NEEDED   cyclobenzaprine 10 MG tablet Commonly known as:  FLEXERIL Take 1 tablet (10 mg total) by mouth 3 (three) times daily.   fexofenadine 180 MG tablet Commonly known as:  ALLEGRA Take 180 mg by mouth daily.   fluconazole 150 MG tablet Commonly known as:  DIFLUCAN Take 1 tablet (150 mg total) by mouth every three (3) days as needed.   levothyroxine 112 MCG tablet Commonly known as:  SYNTHROID, LEVOTHROID TAKE 1 TABLET BY MOUTH  EVERY DAY BEFORE BREAKFAST   meclizine 25 MG tablet Commonly known as:  ANTIVERT Take 1 tablet (25 mg total) by mouth 3 (three) times daily as needed for dizziness.   metoCLOPramide 10 MG tablet Commonly known as:  REGLAN Take 1 tablet (10 mg total) by mouth daily.   mometasone 50 MCG/ACT nasal spray Commonly known as:  NASONEX Place 2 sprays into the nose daily.   NAPROSYN 500 MG tablet Generic drug:  naproxen Take 500 mg by mouth 2 (two) times daily with a meal.   ondansetron 8 MG tablet Commonly known as:  ZOFRAN Take 1 tablet (8 mg total) by mouth every 8 (eight) hours as needed for nausea or vomiting.   oxybutynin 5 MG 24 hr tablet Commonly known as:  DITROPAN-XL Take 1 tablet (5 mg total) by mouth at bedtime.   pantoprazole 40 MG  tablet Commonly known as:  PROTONIX TAKE ONE TABLET BY MOUTH TWICE DAILY - 30 MINUTES PRIOR TO BREAKFAST   propranolol 80 MG tablet Commonly known as:  INDERAL Take 1 tablet (80 mg total) by mouth daily.       Past Surgical History:  She  has a past surgical history that includes Cesarean section; Colonoscopy; Lithotripsy (12/2014); and Knee arthroscopy.  Family History:  Her family history includes Breast cancer in her paternal grandmother; Heart disease in her maternal grandfather and maternal grandmother; Hypertension in her mother; Lung cancer in her father.  Social History:  She  reports that she has been smoking cigarettes. She started smoking about 16 months ago. She has a 3.50 pack-year smoking history. She has never used smokeless tobacco. She reports current alcohol use. She reports that she does not use drugs.

## 2018-02-28 NOTE — Telephone Encounter (Signed)
Zantac changed to omeprazole

## 2018-03-03 DIAGNOSIS — G4733 Obstructive sleep apnea (adult) (pediatric): Secondary | ICD-10-CM | POA: Diagnosis not present

## 2018-03-18 DIAGNOSIS — M79672 Pain in left foot: Secondary | ICD-10-CM | POA: Diagnosis not present

## 2018-03-18 DIAGNOSIS — L03032 Cellulitis of left toe: Secondary | ICD-10-CM | POA: Diagnosis not present

## 2018-03-18 DIAGNOSIS — L600 Ingrowing nail: Secondary | ICD-10-CM | POA: Diagnosis not present

## 2018-03-19 ENCOUNTER — Other Ambulatory Visit: Payer: Self-pay | Admitting: Nurse Practitioner

## 2018-03-19 DIAGNOSIS — I471 Supraventricular tachycardia: Secondary | ICD-10-CM

## 2018-03-19 NOTE — Telephone Encounter (Signed)
Last seen 01/10/18  MMM

## 2018-04-05 ENCOUNTER — Other Ambulatory Visit: Payer: Self-pay | Admitting: Pediatrics

## 2018-04-05 DIAGNOSIS — M545 Low back pain, unspecified: Secondary | ICD-10-CM

## 2018-04-08 ENCOUNTER — Encounter: Payer: Self-pay | Admitting: Family

## 2018-04-08 ENCOUNTER — Ambulatory Visit: Payer: 59 | Admitting: Family

## 2018-04-08 VITALS — BP 121/75 | HR 61 | Temp 97.5°F | Ht 66.0 in | Wt 250.0 lb

## 2018-04-08 DIAGNOSIS — R5383 Other fatigue: Secondary | ICD-10-CM | POA: Diagnosis not present

## 2018-04-08 DIAGNOSIS — R0602 Shortness of breath: Secondary | ICD-10-CM

## 2018-04-08 DIAGNOSIS — I471 Supraventricular tachycardia: Secondary | ICD-10-CM | POA: Diagnosis not present

## 2018-04-08 DIAGNOSIS — F17200 Nicotine dependence, unspecified, uncomplicated: Secondary | ICD-10-CM | POA: Diagnosis not present

## 2018-04-08 NOTE — Progress Notes (Signed)
Subjective:    Patient ID: [name] [name], female    DOB: April 16, 1970, 48 y.o.   MRN: [id number]  Chief Complaint  Patient presents with  . Shortness of Breath   Pt presents to the office today with SOB and tachycardia that started Sunday. She states she has a hx of SVT and when she felt it she did valsalva maneuver. She states her tachycardia improved, but states she has felt drained since and has had a headache. She does report she has had an increase in her stress.   [name] a smoker, but is trying to quit. She was wearing a nicotine patch yesterday, but states she was scared to put it on today.   She has not seen a Cardiologists in years.  Shortness of Breath  This is a new problem.      Review of Systems  Constitutional: Positive for fatigue.  Respiratory: Positive for shortness of breath.   All other systems reviewed and are negative.      Objective:   Physical Exam Vitals signs reviewed.  Constitutional:      General: [name] not in acute distress.    Appearance: [name] well-developed.  HENT:     Head: Normocephalic and atraumatic.     Right Ear: External ear normal.  Eyes:     Pupils: Pupils are equal, round, and reactive to light.  Neck:     Musculoskeletal: Normal range of motion and neck supple.     Thyroid: No thyromegaly.  Cardiovascular:     Rate and Rhythm: Normal rate and regular rhythm.     Heart sounds: Normal heart sounds. No murmur.  Pulmonary:     Effort: Pulmonary effort is normal. No respiratory distress.     Breath sounds: Normal breath sounds. No wheezing.  Abdominal:     General: Bowel sounds are normal. There is no distension.     Palpations: Abdomen is soft.     Tenderness: There is no abdominal tenderness.  Musculoskeletal: Normal range of motion.        General: No tenderness.  Skin:    General: Skin is warm and dry.  Neurological:     Mental Status: [name] alert and oriented to person, place, and time.     Cranial Nerves: No cranial  nerve deficit.     Deep Tendon Reflexes: Reflexes are normal and symmetric.  Psychiatric:        Behavior: Behavior normal.        Thought Content: Thought content normal.        Judgment: Judgment normal.       BP 121/75   Pulse 61   Temp (!) 97.5 F (36.4 C) (Oral)   Ht 5\' 6"  (1.676 m)   Wt 250 lb (113.4 kg)   SpO2 98%   BMI 40.35 kg/m      Assessment & Plan:  [name] [name] comes in today with chief complaint of Shortness of Breath   Diagnosis and orders addressed:  1. Shortness of breath - EKG 12-Lead - Ambulatory referral to Cardiology  2. SVT (supraventricular tachycardia) ([facility]) - Ambulatory referral to Cardiology   3. Fatigue, unspecified type  4. Current smoker Smoking cessation discussed   I have placed a stat Cardiologists appt. Pt was first diagnosed with SVT 2013, but never followed up with Cardiologists. [name] in Sinus Rhythm now, but still having fatigue. Vital Signs are stable and symptoms have been going on for the last two days.  Evelina Dun, FNP

## 2018-04-08 NOTE — Progress Notes (Signed)
Cardiology Office Note   Date:  0/53/9767   ID:  [name] [name], DOB [birth year], MRN [id number]  PCP:  Chevis Pretty, FNP  Cardiologist:   Jenkins Rouge, MD   No chief complaint on file.     History of Present Illness: [name] [name] is a 48 y.o. female who presents for consultation regarding history of SVT, fatigue and dyspnea [name] a smoker Sees Dr Halford Chessman for OSA on CPAP She has not had PFTls  No recent CXR SVT diagnosis goes Back to 2013 Notes from Dr Aundra Dubin in 2019 indicated having palpitations with no arrhythmia on holter He thought They were panic attacks She has been tried on Klonopin, Lexapro and Cymbalta too sedating and was on Zoloft   Dyspnea seems functional she is sedentary and overweight   Stress now from 2 yo son who left the house to be with girlfriend and now Has had to return Works at [employer] to watch TV  Past Medical History:  Diagnosis Date  . Allergy    SEASONAL  . Anxiety   . Chronic headaches   . Colon polyps   . GERD (gastroesophageal reflux disease)   . History of kidney stones   . Hyperlipidemia   . Hypothyroidism   . Palpitations   . Sleep apnea     Past Surgical History:  Procedure Laterality Date  . CESAREAN SECTION     1194  . COLONOSCOPY    . KNEE ARTHROSCOPY    . LITHOTRIPSY  12/2014     Current Outpatient Medications  Medication Sig Dispense Refill  . clonazePAM (KLONOPIN) 0.5 MG tablet TAKE 1 TABLET BY MOUTH TWICE DAILY AS NEEDED 60 tablet 1  . cyclobenzaprine (FLEXERIL) 10 MG tablet TAKE 1 TABLET BY MOUTH THREE TIMES DAILY 60 tablet 0  . fexofenadine (ALLEGRA) 180 MG tablet Take 180 mg by mouth daily.    Marland Kitchen levothyroxine (SYNTHROID, LEVOTHROID) 112 MCG tablet TAKE 1 TABLET BY MOUTH EVERY DAY BEFORE BREAKFAST 30 tablet 5  . meclizine (ANTIVERT) 25 MG tablet Take 1 tablet (25 mg total) by mouth 3 (three) times daily as needed for dizziness. 40 tablet 0  . medroxyPROGESTERone (DEPO-PROVERA) 150 MG/ML injection  INJECT 1ML INTO THE MUSCLE EVERY THREE MONTHS    . metoCLOPramide (REGLAN) 10 MG tablet Take 1 tablet (10 mg total) by mouth daily. 30 tablet 5  . mometasone (NASONEX) 50 MCG/ACT nasal spray Place 2 sprays into the nose daily. 17 g 12  . naproxen (NAPROSYN) 500 MG tablet Take 500 mg by mouth 2 (two) times daily with a meal.    . omeprazole (PRILOSEC) 40 MG capsule Take 40 mg by mouth daily.    . ondansetron (ZOFRAN) 8 MG tablet Take 1 tablet (8 mg total) by mouth every 8 (eight) hours as needed for nausea or vomiting. 20 tablet 0  . oxybutynin (DITROPAN-XL) 5 MG 24 hr tablet Take 1 tablet (5 mg total) by mouth at bedtime. 90 tablet 1  . pantoprazole (PROTONIX) 40 MG tablet TAKE ONE TABLET BY MOUTH TWICE DAILY - 30 MINUTES PRIOR TO BREAKFAST 60 tablet 5  . propranolol (INDERAL) 80 MG tablet Take 1 tablet (80 mg total) by mouth daily. 30 tablet 5   No current facility-administered medications for this visit.     Allergies:   Patient has no known allergies.    Social History:  The patient  reports that she quit smoking 4 days ago. Her smoking use included cigarettes. She  started smoking about 17 months ago. She has a 3.50 pack-year smoking history. She has never used smokeless tobacco. She reports current alcohol use. She reports that she does not use drugs.   Family History:  The patient's family history includes Breast cancer in her paternal grandmother; Heart disease in her maternal grandfather and maternal grandmother; Hypertension in her mother; Lung cancer in her father.    ROS:  Please see the history of present illness.   Otherwise, review of systems are positive for none.   All other systems are reviewed and negative.    PHYSICAL EXAM: VS:  BP 112/80   Pulse 86   Ht 5\' 6"  (1.676 m)   Wt 251 lb 12.8 oz (114.2 kg)   SpO2 98%   BMI 40.64 kg/m  , BMI Body mass index is 40.64 kg/m. Affect appropriate Healthy:  appears stated age 89: normal Neck supple with no adenopathy JVP  normal no bruits no thyromegaly Lungs clear with no wheezing and good diaphragmatic motion Heart:  S1/S2 no murmur, no rub, gallop or click PMI normal Abdomen: benighn, BS positve, no tenderness, no AAA no bruit.  No HSM or HJR Distal pulses intact with no bruits No edema Neuro non-focal Skin warm and dry No muscular weakness    EKG:  SR rate 63 ICRBBB lateral T wave inversions    Recent Labs: 10/10/2017: ALT 24; BUN 11; Creatinine, Ser 0.83; Hemoglobin 14.5; Platelets 325; Potassium 4.6; Sodium 143; TSH 1.990    Lipid Panel    Component Value Date/Time   CHOL 145 10/10/2017 1002   TRIG 180 (H) 10/10/2017 1002   HDL 33 (L) 10/10/2017 1002   CHOLHDL 4.4 10/10/2017 1002   LDLCALC 76 10/10/2017 1002      Wt Readings from Last 3 Encounters:  04/09/18 251 lb 12.8 oz (114.2 kg)  04/08/18 250 lb (113.4 kg)  02/28/18 252 lb 3.2 oz (114.4 kg)      Other studies Reviewed: Additional studies/ records that were reviewed today include: notes from Dr Aundra Dubin 2010 Holter Notes from primary labs ECG Notes from Dr Halford Chessman pulmonary .    ASSESSMENT AND PLAN:  1.  Dyspnea: related to obesity and OSA. Check TTE to assess RV/LV function r/o pulmonary HTN 2.  Abnormal ECG TTE to r/o structural heart disease  3. Palpitations benign no need for monitor 4. SVT:  Distant history of continue inderal given daily palpitations f/u 30 day event monitor  5. Thyroid:  Check TSH/T4 today not done by primary may bear on arrhythmia    Current medicines are reviewed at length with the patient today.  The patient does not have concerns regarding medicines.  The following changes have been made:  no change  Labs/ tests ordered today include: TTE  Event monitor   Orders Placed This Encounter  Procedures  . TSH  . T4, free  . CARDIAC EVENT MONITOR  . ECHOCARDIOGRAM COMPLETE     Disposition:   FU with cardiology PRN      Signed, Jenkins Rouge, MD  04/09/2018 2:13 PM    [facility] [address], [place], [place]  [address] Phone: [phone]; Fax: [phone]

## 2018-04-08 NOTE — Patient Instructions (Signed)
Supraventricular Tachycardia, Adult  Supraventricular tachycardia (SVT) is a kind of abnormal heartbeat. It makes your heart beat very fast and then beat at a normal speed.  A normal resting heartbeat is 60-100 times a minute. This condition can make your heart beat more than 150 times a minute. Times of having a fast heartbeat (episodes) can be scary, but they are usually not dangerous. However, in some cases, they can lead to heart failure if:  · They happen several times per day.  · Last for longer than a few seconds.  What are the causes?  Usually, a normal heartbeat starts when an area called the sinoatrial node releases an electrical signal. In SVT, other areas of the heart send out electrical signals that interfere with the signal from the sinoatrial node.  What increases the risk?  · Being 12?47 years old.  · Being a woman.  The following factors may make you more likely to develop this condition:  · Stress.  · Tiredness.  · Smoking.  · Stimulant drugs, such as cocaine and methamphetamine  · Alcohol.  · Caffeine.  · Pregnancy.  · Anxiety.  What are the signs or symptoms?  · A pounding heart.  ? A feeling that your heart is skipping beats (palpitations).  ? Weakness.  ? Trouble getting enough air.  ? Pain or tightness in your chest.  ? Feeling like you are going to pass out.  ? Feeling worried or nervous.  ? Dizziness.  ? Sweating.  ? Feeling like you might throw up.  ? Passing out (fainting).  ? Tiredness.  Sometimes, there are no symptoms.  How is this treated?  · Vagal nerve stimulation. Ways to do this:  ? Holding your breath and pushing, as though you are pooping.  ? Massaging an area on one side of your neck. Do not try this yourself. Only a doctor should do this. If done the wrong way, it can lead to a stroke.  ? Bending forward with your head between your legs.  ? Coughing while bending forward with your head between your legs.  ? Closing your eyes and massaging your eyeballs. Ask a doctor how to do  this.  · Medicines that prevent attacks.  · Medicine to stop an attack given through an IV at the hospital.  · A small electric shock (cardioversion) that stops an attack.  · Radiofrequency ablation. In this procedure, a small, thin tube (catheter) is used to send radiofrequency energy to the area that is causing the rapid heartbeats.  Follow these instructions at home:  Stress    · Avoid things that make you feel stressed.  · To deal with stress, try:  ? Doing yoga, meditation, or being out in nature.  ? Listening to relaxing music.  ? Doing deep breathing.  ? Taking steps to be healthy, such as getting lots of sleep, exercising, and eating a balanced diet.  ? Talking with a mental health doctor.  Sleep  · Try to get at least 7 hours of sleep each night.  Tobacco and nicotine    · Do not use any products that contain nicotine or tobacco, such as cigarettes, e-cigarettes, and chewing tobacco. If you need help quitting, ask your doctor.  Alcohol  · If alcohol gives you a fast heartbeat, do not drink alcohol.  · Even in alcohol does not seem to give you a fast heartbeat, limit alcohol use,.  ? For nonpregnant women, this means no more than 1   drink a day.  ? For men, this means no more than 2 drinks a day.  § "One drink" means one of these:  § 12 oz of beer (355 mL).  § 5 oz of wine (148 mL)  § 1½ oz of hard liquor (44 mL).  Caffeine  · If caffeine gives you a fast heartbeat, do not eat, drink, or use anything with caffeine in it.  · Even if caffeine does not seem to give you a fast heartbeat, limit how much caffeine you eat, drink, or use.  Stimulant drugs  · Do not use drugs such as cocaine or methamphetamine. If you need help quitting, ask your doctor.  General instructions  · Stay at a healthy weight.  · Exercise regularly. Ask your doctor about good activities for you. Try one of these:  ? 150 minutes a week of gentle exercise, like walking or yoga.  ? 75 minutes a week of exercise that is very active, like  running or swimming.  · Try a combination of gentle exercise and very active exercise.  · Do home treatments to slow down your heartbeat.  · Take over-the-counter and prescription medicines only as told by your doctor.  Contact a doctor if:  · You have a fast heartbeat more often.  · Times of having a fast heartbeat last longer than before.  · Home treatments to slow down your heartbeat do not help.  · You have new symptoms.  Get help right away if:  · You have chest pain.  · Your symptoms get worse.  · You have trouble breathing.  · Your heart beats very fast for more than 20 minutes.  · You pass out.  These symptoms may be an emergency. Do not wait to see if the symptoms will go away. Get medical help right away. Call your local emergency services (911 in the [place] Do not drive yourself to the hospital.  Summary  · SVT is a type of abnormal heart beat.  · During an episode, our heart rate may be higher than 150 beats per minute  · Treatment depends on how often the condition happens and your symptoms.  This information is not intended to replace advice given to you by your health care provider. Make sure you discuss any questions you have with your health care provider.  Document Released: 02/26/2005 Document Revised: 11/03/2015 Document Reviewed: 11/03/2015  Elsevier Interactive Patient Education © 2019 Elsevier Inc.

## 2018-04-09 ENCOUNTER — Ambulatory Visit: Payer: 59 | Admitting: Cardiovascular Disease

## 2018-04-09 ENCOUNTER — Encounter: Payer: Self-pay | Admitting: Cardiovascular Disease

## 2018-04-09 VITALS — BP 112/80 | HR 86 | Ht 66.0 in | Wt 251.8 lb

## 2018-04-09 DIAGNOSIS — R0600 Dyspnea, unspecified: Secondary | ICD-10-CM | POA: Diagnosis not present

## 2018-04-09 DIAGNOSIS — I471 Supraventricular tachycardia: Secondary | ICD-10-CM | POA: Diagnosis not present

## 2018-04-09 DIAGNOSIS — R9431 Abnormal electrocardiogram [ECG] [EKG]: Secondary | ICD-10-CM

## 2018-04-09 DIAGNOSIS — R002 Palpitations: Secondary | ICD-10-CM

## 2018-04-09 NOTE — Patient Instructions (Addendum)
Medication Instructions:   If you need a refill on your cardiac medications before your next appointment, please call your pharmacy.   Lab work: Your physician recommends that you have lab work today. TSH and T4  If you have labs (blood work) drawn today and your tests are completely normal, you will receive your results only by: Marland Kitchen MyChart Message (if you have MyChart) OR . A paper copy in the mail If you have any lab test that is abnormal or we need to change your treatment, we will call you to review the results.  Testing/Procedures: Your physician has requested that you have an echocardiogram. Echocardiography is a painless test that uses sound waves to create images of your heart. It provides your doctor with information about the size and shape of your heart and how well your heart's chambers and valves are working. This procedure takes approximately one hour. There are no restrictions for this procedure.  Your physician has recommended that you wear an event monitor. Event monitors are medical devices that record the heart's electrical activity. Doctors most often [place] these monitors to diagnose arrhythmias. Arrhythmias are problems with the speed or rhythm of the heartbeat. The monitor is a small, portable device. You can wear one while you do your normal daily activities. This is usually used to diagnose what is causing palpitations/syncope (passing out).  Follow-Up: At [facility], you and your health needs are our priority.  As part of our continuing mission to provide you with exceptional heart care, we have created designated Provider Care Teams.  These Care Teams include your primary Cardiologist (physician) and Advanced Practice Providers (APPs -  Physician Assistants and Nurse Practitioners) who all work together to provide you with the care you need, when you need it. Your physician recommends that you schedule a follow-up appointment as needed with Dr. Johnsie Cancel.

## 2018-04-10 LAB — T4, FREE: Free T4: 1.15 ng/dL (ref 0.82–1.77)

## 2018-04-10 LAB — TSH: TSH: 2.99 u[IU]/mL (ref 0.450–4.500)

## 2018-04-14 ENCOUNTER — Ambulatory Visit: Payer: 59 | Admitting: Nurse Practitioner

## 2018-04-15 ENCOUNTER — Other Ambulatory Visit: Payer: Self-pay | Admitting: Nurse Practitioner

## 2018-04-15 DIAGNOSIS — G43901 Migraine, unspecified, not intractable, with status migrainosus: Secondary | ICD-10-CM

## 2018-04-15 DIAGNOSIS — K219 Gastro-esophageal reflux disease without esophagitis: Secondary | ICD-10-CM

## 2018-04-15 DIAGNOSIS — E039 Hypothyroidism, unspecified: Secondary | ICD-10-CM

## 2018-04-16 ENCOUNTER — Telehealth: Payer: Self-pay | Admitting: Nurse Practitioner

## 2018-04-16 NOTE — Telephone Encounter (Signed)
rtn call to [facility]

## 2018-04-17 ENCOUNTER — Other Ambulatory Visit (HOSPITAL_COMMUNITY): Payer: 59

## 2018-04-17 NOTE — Telephone Encounter (Signed)
Pt states she was just wanting to try and be seen earlier than her appt but aware [facility] doesn't have any openings before then and pt voiced understanding.

## 2018-04-22 ENCOUNTER — Encounter: Payer: Self-pay | Admitting: Nurse Practitioner

## 2018-04-22 ENCOUNTER — Ambulatory Visit: Payer: 59 | Admitting: Nurse Practitioner

## 2018-04-22 VITALS — BP 113/70 | HR 55 | Temp 98.3°F | Ht 66.0 in | Wt 251.0 lb

## 2018-04-22 DIAGNOSIS — E8881 Metabolic syndrome: Secondary | ICD-10-CM

## 2018-04-22 DIAGNOSIS — R739 Hyperglycemia, unspecified: Secondary | ICD-10-CM

## 2018-04-22 LAB — BAYER DCA HB A1C WAIVED: HB A1C (BAYER DCA - WAIVED): 6.7 % (ref <7.0)

## 2018-04-22 NOTE — Progress Notes (Signed)
   Subjective:    Patient ID: [name] [name], female    DOB: 1970-08-24, 48 y.o.   MRN: [id number]   Chief Complaint: hyperglycemia  HPI Patient had blood work done at work and fasting blood sugar was 127. She immediately started on diet and exercise. She comes in today for HGBA1c. She had cardiology appointment several weeks ago due to SOB. Heart was ok.  She stopped smoking last Thursday.   Review of Systems  Constitutional: Positive for fatigue. Negative for chills and fever.  HENT: Negative.   Respiratory: Negative.   Cardiovascular: Negative.   Gastrointestinal: Negative.   Genitourinary: Negative.   Psychiatric/Behavioral: Negative.        Objective:   Physical Exam Vitals signs and nursing note reviewed.  Constitutional:      General: [name] not in acute distress.    Appearance: [name] obese.  Cardiovascular:     Rate and Rhythm: Normal rate and regular rhythm.  Pulmonary:     Effort: Pulmonary effort is normal.     Breath sounds: Normal breath sounds.  Abdominal:     General: Abdomen is flat.  Skin:    General: Skin is warm and dry.  Neurological:     General: No focal deficit present.     Mental Status: [name] alert and oriented to person, place, and time.  Psychiatric:        Mood and Affect: Mood normal.        Behavior: Behavior normal.    BP 113/70   Pulse (!) 55   Temp 98.3 F (36.8 C) (Oral)   Ht '5\' 6"'$  (1.676 m)   Wt 251 lb (113.9 kg)   BMI 40.51 kg/m   hgba1c 6.7%     Assessment & Plan:  [name] [name] in today with chief complaint of Discuss blood sugar   1. Hyperglycemia - Bayer DCA Hb A1c Waived - CMP14+EGFR - CMP14+EGFR - Bayer DCA Hb A1c Waived  2. Metabolic syndrome Watch carbs in diet- long discussion about diet- 70mnutes Exercise encouraged Follow up in 3 months  [place] FNP

## 2018-04-22 NOTE — Patient Instructions (Signed)
Diabetes Mellitus and Nutrition, Adult  When you have diabetes (diabetes mellitus), it is very important to have healthy eating habits because your blood sugar (glucose) levels are greatly affected by what you eat and drink. Eating healthy foods in the appropriate amounts, at about the same times every day, can help you:  · Control your blood glucose.  · Lower your risk of heart disease.  · Improve your blood pressure.  · Reach or maintain a healthy weight.  Every person with diabetes is different, and each person has different needs for a meal plan. Your health care provider may recommend that you work with a diet and nutrition specialist (dietitian) to make a meal plan that is best for you. Your meal plan may vary depending on factors such as:  · The calories you need.  · The medicines you take.  · Your weight.  · Your blood glucose, blood pressure, and cholesterol levels.  · Your activity level.  · Other health conditions you have, such as heart or kidney disease.  How do carbohydrates affect me?  Carbohydrates, also called carbs, affect your blood glucose level more than any other type of food. Eating carbs naturally raises the amount of glucose in your blood. Carb counting is a method for keeping track of how many carbs you eat. Counting carbs is important to keep your blood glucose at a healthy level, especially if you use insulin or take certain oral diabetes medicines.  It is important to know how many carbs you can safely have in each meal. This is different for every person. Your dietitian can help you calculate how many carbs you should have at each meal and for each snack.  Foods that contain carbs include:  · Bread, cereal, rice, pasta, and crackers.  · Potatoes and corn.  · Peas, beans, and lentils.  · Milk and yogurt.  · Fruit and juice.  · Desserts, such as cakes, cookies, ice cream, and candy.  How does alcohol affect me?  Alcohol can cause a sudden decrease in blood glucose (hypoglycemia),  especially if you use insulin or take certain oral diabetes medicines. Hypoglycemia can be a life-threatening condition. Symptoms of hypoglycemia (sleepiness, dizziness, and confusion) are similar to symptoms of having too much alcohol.  If your health care provider says that alcohol is safe for you, follow these guidelines:  · Limit alcohol intake to no more than 1 drink per day for nonpregnant women and 2 drinks per day for men. One drink equals 12 oz of beer, 5 oz of wine, or 1½ oz of hard liquor.  · Do not drink on an empty stomach.  · Keep yourself hydrated with water, diet soda, or unsweetened iced tea.  · Keep in mind that regular soda, juice, and other mixers may contain a lot of sugar and must be counted as carbs.  What are tips for following this plan?    Reading food labels  · Start by checking the serving size on the "Nutrition Facts" label of packaged foods and drinks. The amount of calories, carbs, fats, and other nutrients listed on the label is based on one serving of the item. Many items contain more than one serving per package.  · Check the total grams (g) of carbs in one serving. You can calculate the number of servings of carbs in one serving by dividing the total carbs by 15. For example, if a food has 30 g of total carbs, it would be equal to 2   servings of carbs.  · Check the number of grams (g) of saturated and trans fats in one serving. Choose foods that have low or no amount of these fats.  · Check the number of milligrams (mg) of salt (sodium) in one serving. Most people should limit total sodium intake to less than 2,300 mg per day.  · Always check the nutrition information of foods labeled as "low-fat" or "nonfat". These foods may be higher in added sugar or refined carbs and should be avoided.  · Talk to your dietitian to identify your daily goals for nutrients listed on the label.  Shopping  · Avoid buying canned, premade, or processed foods. These foods tend to be high in fat, sodium,  and added sugar.  · Shop around the outside edge of the grocery store. This includes fresh fruits and vegetables, bulk grains, fresh meats, and fresh dairy.  Cooking  · Use low-heat cooking methods, such as baking, instead of high-heat cooking methods like deep frying.  · Cook using healthy oils, such as olive, canola, or sunflower oil.  · Avoid cooking with butter, cream, or high-fat meats.  Meal planning  · Eat meals and snacks regularly, preferably at the same times every day. Avoid going long periods of time without eating.  · Eat foods high in fiber, such as fresh fruits, vegetables, beans, and whole grains. Talk to your dietitian about how many servings of carbs you can eat at each meal.  · Eat 4-6 ounces (oz) of lean protein each day, such as lean meat, chicken, fish, eggs, or tofu. One oz of lean protein is equal to:  ? 1 oz of meat, chicken, or fish.  ? 1 egg.  ? ¼ cup of tofu.  · Eat some foods each day that contain healthy fats, such as avocado, nuts, seeds, and fish.  Lifestyle  · Check your blood glucose regularly.  · Exercise regularly as told by your health care provider. This may include:  ? 150 minutes of moderate-intensity or vigorous-intensity exercise each week. This could be brisk walking, biking, or water aerobics.  ? Stretching and doing strength exercises, such as yoga or weightlifting, at least 2 times a week.  · Take medicines as told by your health care provider.  · Do not use any products that contain nicotine or tobacco, such as cigarettes and e-cigarettes. If you need help quitting, ask your health care provider.  · Work with a counselor or diabetes educator to identify strategies to manage stress and any emotional and social challenges.  Questions to ask a health care provider  · Do I need to meet with a diabetes educator?  · Do I need to meet with a dietitian?  · What number can I call if I have questions?  · When are the best times to check my blood glucose?  Where to find more  information:  · American Diabetes Association: diabetes.org  · Academy of Nutrition and Dietetics: www.eatright.org  · National Institute of Diabetes and Digestive and Kidney Diseases (NIH): www.niddk.nih.gov  Summary  · A healthy meal plan will help you control your blood glucose and maintain a healthy lifestyle.  · Working with a diet and nutrition specialist (dietitian) can help you make a meal plan that is best for you.  · Keep in mind that carbohydrates (carbs) and alcohol have immediate effects on your blood glucose levels. It is important to count carbs and to use alcohol carefully.  This information is not intended to   replace advice given to you by your health care provider. Make sure you discuss any questions you have with your health care provider.  Document Released: 11/23/2004 Document Revised: 09/26/2016 Document Reviewed: 04/02/2016  Elsevier Interactive Patient Education © 2019 Elsevier Inc.

## 2018-04-23 LAB — CMP14+EGFR
ALT: 30 IU/L (ref 0–32)
AST: 29 IU/L (ref 0–40)
Albumin/Globulin Ratio: 1.6 (ref 1.2–2.2)
Albumin: 4.2 g/dL (ref 3.8–4.8)
Alkaline Phosphatase: 81 IU/L (ref 39–117)
BUN/Creatinine Ratio: 18 (ref 9–23)
BUN: 17 mg/dL (ref 6–24)
Bilirubin Total: 0.2 mg/dL (ref 0.0–1.2)
CO2: 19 mmol/L — ABNORMAL LOW (ref 20–29)
Calcium: 9.5 mg/dL (ref 8.7–10.2)
Chloride: 107 mmol/L — ABNORMAL HIGH (ref 96–106)
Creatinine, Ser: 0.97 mg/dL (ref 0.57–1.00)
GFR calc Af Amer: 81 mL/min/{1.73_m2} (ref >59)
GFR calc non Af Amer: 70 mL/min/{1.73_m2} (ref >59)
GLUCOSE: 82 mg/dL (ref 65–99)
Globulin, Total: 2.7 g/dL (ref 1.5–4.5)
Potassium: 3.9 mmol/L (ref 3.5–5.2)
Sodium: 143 mmol/L (ref 134–144)
Total Protein: 6.9 g/dL (ref 6.0–8.5)

## 2018-05-12 ENCOUNTER — Telehealth: Payer: Self-pay | Admitting: Nurse Practitioner

## 2018-05-12 NOTE — Telephone Encounter (Signed)
Has to be seen to get antibiotic or can do e visit

## 2018-05-12 NOTE — Telephone Encounter (Signed)
Pharmacy: [name]   Pt is having head and nose congestion, sneezing, nose running, eyes burning, denies sore throat or cough. No Fever. [name] that she gets this twice a year and wants to know if a zpack could be sent in.

## 2018-05-13 ENCOUNTER — Other Ambulatory Visit: Payer: Self-pay | Admitting: Nurse Practitioner

## 2018-05-13 DIAGNOSIS — K219 Gastro-esophageal reflux disease without esophagitis: Secondary | ICD-10-CM

## 2018-05-13 DIAGNOSIS — M545 Low back pain, unspecified: Secondary | ICD-10-CM

## 2018-05-13 DIAGNOSIS — I471 Supraventricular tachycardia: Secondary | ICD-10-CM

## 2018-05-13 NOTE — Telephone Encounter (Signed)
Last seen 04/22/18  MMM

## 2018-05-13 NOTE — Telephone Encounter (Signed)
Patient aware she will need to be seen or do an Evisit for antibiotic.

## 2018-05-20 ENCOUNTER — Encounter: Payer: Self-pay | Admitting: Nurse Practitioner

## 2018-05-20 ENCOUNTER — Ambulatory Visit (INDEPENDENT_AMBULATORY_CARE_PROVIDER_SITE_OTHER): Payer: 59 | Admitting: Nurse Practitioner

## 2018-05-20 ENCOUNTER — Other Ambulatory Visit: Payer: Self-pay

## 2018-05-20 VITALS — BP 109/73 | HR 73 | Temp 98.1°F | Ht 66.0 in | Wt 246.0 lb

## 2018-05-20 DIAGNOSIS — R590 Localized enlarged lymph nodes: Secondary | ICD-10-CM | POA: Diagnosis not present

## 2018-05-20 MED ORDER — CEPHALEXIN 500 MG PO CAPS: 500.0000 mg | ORAL_CAPSULE | Freq: Three times a day (TID) | ORAL | 0 refills | Status: DC

## 2018-05-20 NOTE — Progress Notes (Signed)
   Subjective:    Patient ID: [name] [name], female    DOB: [birth year], 48 y.o.   MRN: [id number]   Chief Complaint: swollen under neck   HPI Patient come in c/o tender knot under her chin. Sh efelt it this morning.    Review of Systems  Constitutional: Negative for chills and fever.  HENT: Negative for congestion, rhinorrhea, sinus pressure and sinus pain.   Respiratory: Negative for cough.   Cardiovascular: Negative.   Gastrointestinal: Negative.   Neurological: Negative.   Psychiatric/Behavioral: Negative.        Objective:   Physical Exam Vitals signs and nursing note reviewed.  Constitutional:      Appearance: Normal appearance. [name] obese.  HENT:     Right Ear: Hearing, tympanic membrane, ear canal and external ear normal.     Left Ear: Hearing, tympanic membrane, ear canal and external ear normal.     Nose: No congestion.     Mouth/Throat:     Lips: Pink.     Mouth: Mucous membranes are moist.  Neck:     Musculoskeletal: Normal range of motion.     Comments: Palpable tender 3-4cm nodule submental Cardiovascular:     Rate and Rhythm: Normal rate and regular rhythm.  Pulmonary:     Breath sounds: Normal breath sounds.  Skin:    General: Skin is warm and dry.  Neurological:     General: No focal deficit present.     Mental Status: [name] alert and oriented to person, place, and time.  Psychiatric:        Mood and Affect: Mood normal.    BP 109/73   Pulse 73   Temp 98.1 F (36.7 C) (Oral)   Ht 5\' 6"  (1.676 m)   Wt 246 lb (111.6 kg)   BMI 39.71 kg/m       Assessment & Plan:  [name] [name] in today with chief complaint of swollen under neck   1. Submental adenopathy Watch area- if not improving call and we will schedule ultra sound - cephALEXin (KEFLEX) 500 MG capsule; Take 1 capsule (500 mg total) by mouth 3 (three) times daily.  Dispense: 30 capsule; Refill: 0   Mary-Margaret Hassell Done, FNP

## 2018-05-29 DIAGNOSIS — Z3042 Encounter for surveillance of injectable contraceptive: Secondary | ICD-10-CM | POA: Diagnosis not present

## 2018-06-15 ENCOUNTER — Other Ambulatory Visit: Payer: Self-pay | Admitting: Pediatrics

## 2018-06-15 ENCOUNTER — Other Ambulatory Visit: Payer: Self-pay | Admitting: Nurse Practitioner

## 2018-06-15 DIAGNOSIS — N3289 Other specified disorders of bladder: Secondary | ICD-10-CM

## 2018-07-18 ENCOUNTER — Other Ambulatory Visit: Payer: Self-pay

## 2018-07-21 ENCOUNTER — Encounter: Payer: Self-pay | Admitting: Nurse Practitioner

## 2018-07-21 ENCOUNTER — Ambulatory Visit: Payer: 59 | Admitting: Nurse Practitioner

## 2018-07-21 ENCOUNTER — Other Ambulatory Visit: Payer: Self-pay

## 2018-07-21 VITALS — BP 112/75 | HR 66 | Temp 98.1°F | Ht 66.0 in | Wt 248.0 lb

## 2018-07-21 DIAGNOSIS — G4733 Obstructive sleep apnea (adult) (pediatric): Secondary | ICD-10-CM

## 2018-07-21 DIAGNOSIS — F410 Panic disorder [episodic paroxysmal anxiety] without agoraphobia: Secondary | ICD-10-CM

## 2018-07-21 DIAGNOSIS — E039 Hypothyroidism, unspecified: Secondary | ICD-10-CM | POA: Diagnosis not present

## 2018-07-21 DIAGNOSIS — G43901 Migraine, unspecified, not intractable, with status migrainosus: Secondary | ICD-10-CM

## 2018-07-21 DIAGNOSIS — N3289 Other specified disorders of bladder: Secondary | ICD-10-CM

## 2018-07-21 DIAGNOSIS — K219 Gastro-esophageal reflux disease without esophagitis: Secondary | ICD-10-CM | POA: Insufficient documentation

## 2018-07-21 DIAGNOSIS — I471 Supraventricular tachycardia: Secondary | ICD-10-CM

## 2018-07-21 DIAGNOSIS — R3915 Urgency of urination: Secondary | ICD-10-CM

## 2018-07-21 DIAGNOSIS — E8881 Metabolic syndrome: Secondary | ICD-10-CM

## 2018-07-21 LAB — BAYER DCA HB A1C WAIVED: HB A1C (BAYER DCA - WAIVED): 5.8 % (ref <7.0)

## 2018-07-21 MED ORDER — CLONAZEPAM 0.5 MG PO TABS: 0.5000 mg | ORAL_TABLET | Freq: Two times a day (BID) | ORAL | 2 refills | Status: DC | PRN

## 2018-07-21 MED ORDER — METOCLOPRAMIDE HCL 10 MG PO TABS: 10.0000 mg | ORAL_TABLET | Freq: Every day | ORAL | 1 refills | Status: DC

## 2018-07-21 MED ORDER — LEVOTHYROXINE SODIUM 112 MCG PO TABS: 112.0000 ug | ORAL_TABLET | Freq: Every day | ORAL | 1 refills | Status: DC

## 2018-07-21 MED ORDER — PANTOPRAZOLE SODIUM 40 MG PO TBEC: DELAYED_RELEASE_TABLET | ORAL | 5 refills | Status: DC

## 2018-07-21 MED ORDER — OXYBUTYNIN CHLORIDE ER 5 MG PO TB24: 5.0000 mg | ORAL_TABLET | Freq: Every day | ORAL | 1 refills | Status: DC

## 2018-07-21 MED ORDER — PROPRANOLOL HCL 80 MG PO TABS: 80.0000 mg | ORAL_TABLET | Freq: Every day | ORAL | 1 refills | Status: DC

## 2018-07-21 NOTE — Patient Instructions (Signed)
Diabetes Basics    Diabetes (diabetes mellitus) is a long-term (chronic) disease. It occurs when the body does not properly use sugar (glucose) that is released from food after you eat.  Diabetes may be caused by one or both of these problems:  · Your pancreas does not make enough of a hormone called insulin.  · Your body does not react in a normal way to insulin that it makes.  Insulin lets sugars (glucose) go into cells in your body. This gives you energy. If you have diabetes, sugars cannot get into cells. This causes high blood sugar (hyperglycemia).  Follow these instructions at home:  How is diabetes treated?  You may need to take insulin or other diabetes medicines daily to keep your blood sugar in balance. Take your diabetes medicines every day as told by your doctor. List your diabetes medicines here:  Diabetes medicines  · Name of medicine: ______________________________  ? Amount (dose): _______________ Time (a.m./p.m.): _______________ Notes: ___________________________________  · Name of medicine: ______________________________  ? Amount (dose): _______________ Time (a.m./p.m.): _______________ Notes: ___________________________________  · Name of medicine: ______________________________  ? Amount (dose): _______________ Time (a.m./p.m.): _______________ Notes: ___________________________________  If you use insulin, you will learn how to give yourself insulin by injection. You may need to adjust the amount based on the food that you eat. List the types of insulin you use here:  Insulin  · Insulin type: ______________________________  ? Amount (dose): _______________ Time (a.m./p.m.): _______________ Notes: ___________________________________  · Insulin type: ______________________________  ? Amount (dose): _______________ Time (a.m./p.m.): _______________ Notes: ___________________________________  · Insulin type: ______________________________  ? Amount (dose): _______________ Time (a.m./p.m.):  _______________ Notes: ___________________________________  · Insulin type: ______________________________  ? Amount (dose): _______________ Time (a.m./p.m.): _______________ Notes: ___________________________________  · Insulin type: ______________________________  ? Amount (dose): _______________ Time (a.m./p.m.): _______________ Notes: ___________________________________  How do I manage my blood sugar?    Check your blood sugar levels using a blood glucose monitor as directed by your doctor.  Your doctor will set treatment goals for you. Generally, you should have these blood sugar levels:  · Before meals (preprandial): 80-130 mg/dL (4.4-7.2 mmol/L).  · After meals (postprandial): below 180 mg/dL (10 mmol/L).  · A1c level: less than 7%.  Write down the times that you will check your blood sugar levels:  Blood sugar checks  · Time: _______________ Notes: ___________________________________  · Time: _______________ Notes: ___________________________________  · Time: _______________ Notes: ___________________________________  · Time: _______________ Notes: ___________________________________  · Time: _______________ Notes: ___________________________________  · Time: _______________ Notes: ___________________________________    What do I need to know about low blood sugar?  Low blood sugar is called hypoglycemia. This is when blood sugar is at or below 70 mg/dL (3.9 mmol/L). Symptoms may include:  · Feeling:  ? Hungry.  ? Worried or nervous (anxious).  ? Sweaty and clammy.  ? Confused.  ? Dizzy.  ? Sleepy.  ? Sick to your stomach (nauseous).  · Having:  ? A fast heartbeat.  ? A headache.  ? A change in your vision.  ? Tingling or no feeling (numbness) around the mouth, lips, or tongue.  ? Jerky movements that you cannot control (seizure).  · Having trouble with:  ? Moving (coordination).  ? Sleeping.  ? Passing out (fainting).  ? Getting upset easily (irritability).  Treating low blood sugar  To treat low blood  sugar, eat or drink something sugary right away. If you can think clearly and swallow safely, follow the 15:15   rule:  · Take 15 grams of a fast-acting carb (carbohydrate). Talk with your doctor about how much you should take.  · Some fast-acting carbs are:  ? Sugar tablets (glucose pills). Take 3-4 glucose pills.  ? 6-8 pieces of hard candy.  ? 4-6 oz (120-150 mL) of fruit juice.  ? 4-6 oz (120-150 mL) of regular (not diet) soda.  ? 1 Tbsp (15 mL) honey or sugar.  · Check your blood sugar 15 minutes after you take the carb.  · If your blood sugar is still at or below 70 mg/dL (3.9 mmol/L), take 15 grams of a carb again.  · If your blood sugar does not go above 70 mg/dL (3.9 mmol/L) after 3 tries, get help right away.  · After your blood sugar goes back to normal, eat a meal or a snack within 1 hour.  Treating very low blood sugar  If your blood sugar is at or below 54 mg/dL (3 mmol/L), you have very low blood sugar (severe hypoglycemia). This is an emergency. Do not wait to see if the symptoms will go away. Get medical help right away. Call your local emergency services (911 in the [place] Do not drive yourself to the hospital.  Questions to ask your health care provider  · Do I need to meet with a diabetes educator?  · What equipment will I need to care for myself at home?  · What diabetes medicines do I need? When should I take them?  · How often do I need to check my blood sugar?  · What number can I call if I have questions?  · When is my next doctor's visit?  · Where can I find a support group for people with diabetes?  Where to find more information  · American Diabetes Association: www.diabetes.org  · American Association of Diabetes Educators: www.diabeteseducator.org/patient-resources  Contact a doctor if:  · Your blood sugar is at or above 240 mg/dL (13.3 mmol/L) for 2 days in a row.  · You have been sick or have had a fever for 2 days or more, and you are not getting better.  · You have any of these  problems for more than 6 hours:  ? You cannot eat or drink.  ? You feel sick to your stomach (nauseous).  ? You throw up (vomit).  ? You have watery poop (diarrhea).  Get help right away if:  · Your blood sugar is lower than 54 mg/dL (3 mmol/L).  · You get confused.  · You have trouble:  ? Thinking clearly.  ? Breathing.  Summary  · Diabetes (diabetes mellitus) is a long-term (chronic) disease. It occurs when the body does not properly use sugar (glucose) that is released from food after digestion.  · Take insulin and diabetes medicines as told.  · Check your blood sugar every day, as often as told.  · Keep all follow-up visits as told by your doctor. This is important.  This information is not intended to replace advice given to you by your health care provider. Make sure you discuss any questions you have with your health care provider.  Document Released: 05/31/2017 Document Revised: 08/19/2017 Document Reviewed: 05/31/2017  Elsevier Interactive Patient Education © 2019 [employer]vier Inc.

## 2018-07-21 NOTE — Progress Notes (Signed)
Subjective:    Patient ID: [name] [name], female    DOB: 1970-04-05, 48 y.o.   MRN: [id number]   Chief Complaint: medical management of chronic issues   HPI:  1. OSA (obstructive sleep apnea) Wears CPAP nightly- feels rested in mornings.  2. Hypothyroidism, unspecified type No problems that aware of  3. Panic attacks Takes klonopin 0.'5mg'$  BID and that helps to keep her calm.  4. Metabolic syndrome She has been watching diet. Last hgba1c was 6.7. just doing diet control right now. Doe snot check blood sugars at home.  5. SVT (supraventricular tachycardia) (HCC) No more runs of SVT in the last 6 m onths  6. Migraine with status migrainosus, not intractable, unspecified migraine type No recent migraines. Has had a dull headache from allergies  7. Morbid obesity ([name]) Weight is unchanged    Outpatient Encounter Medications as of 07/21/2018  Medication Sig  . clonazePAM (KLONOPIN) 0.5 MG tablet TAKE 1 TABLET BY MOUTH TWICE DAILY AS NEEDED  . cyclobenzaprine (FLEXERIL) 10 MG tablet TAKE 1 TABLET BY MOUTH THREE TIMES DAILY  . fexofenadine (ALLEGRA) 180 MG tablet Take 180 mg by mouth daily.  Marland Kitchen levothyroxine (SYNTHROID, LEVOTHROID) 112 MCG tablet TAKE 1 TABLET BY MOUTH EVERY DAY BEFORE BREAKFAST  . meclizine (ANTIVERT) 25 MG tablet Take 1 tablet (25 mg total) by mouth 3 (three) times daily as needed for dizziness.  . medroxyPROGESTERone (DEPO-PROVERA) 150 MG/ML injection INJECT 1ML INTO THE MUSCLE EVERY THREE MONTHS  . metoCLOPramide (REGLAN) 10 MG tablet TAKE 1 TABLET BY MOUTH EVERY DAY  . mometasone (NASONEX) 50 MCG/ACT nasal spray Place 2 sprays into the nose daily.  . naproxen (NAPROSYN) 500 MG tablet Take 500 mg by mouth 2 (two) times daily with a meal.  . omeprazole (PRILOSEC) 40 MG capsule TAKE ONE CAPSULE BY MOUTH DAILY  . ondansetron (ZOFRAN) 8 MG tablet Take 1 tablet (8 mg total) by mouth every 8 (eight) hours as needed for nausea or vomiting.  Marland Kitchen oxybutynin (DITROPAN-XL)  5 MG 24 hr tablet TAKE 1 TABLET BY MOUTH AT BEDTIME  . pantoprazole (PROTONIX) 40 MG tablet TAKE 1 TABLET BY MOUTH TWICE DAILY - 30 MINUTES PRIOR TO BREAKFAST  . propranolol (INDERAL) 80 MG tablet TAKE 1 TABLET BY MOUTH EVERY DAY      New complaints: None today  Social history: Son still lives with her.      Review of Systems  Constitutional: Negative for activity change and appetite change.  HENT: Negative.   Eyes: Negative for pain.  Respiratory: Negative for shortness of breath.   Cardiovascular: Negative for chest pain, palpitations and leg swelling.  Gastrointestinal: Negative for abdominal pain.  Endocrine: Negative for polydipsia.  Genitourinary: Negative.   Skin: Negative for rash.  Neurological: Negative for dizziness, weakness and headaches.  Hematological: Does not bruise/bleed easily.  Psychiatric/Behavioral: Negative.   All other systems reviewed and are negative.      Objective:   Physical Exam Vitals signs and nursing note reviewed.  Constitutional:      General: [name] not in acute distress.    Appearance: Normal appearance. [name] well-developed.  HENT:     Head: Normocephalic.     Nose: Nose normal.  Eyes:     Pupils: Pupils are equal, round, and reactive to light.  Neck:     Musculoskeletal: Normal range of motion and neck supple.     Vascular: No carotid bruit or JVD.  Cardiovascular:     Rate and Rhythm:  Normal rate and regular rhythm.     Heart sounds: Normal heart sounds.  Pulmonary:     Effort: Pulmonary effort is normal. No respiratory distress.     Breath sounds: Normal breath sounds. No wheezing or rales.  Chest:     Chest wall: No tenderness.  Abdominal:     General: Bowel sounds are normal. There is no distension or abdominal bruit.     Palpations: Abdomen is soft. There is no hepatomegaly, splenomegaly, mass or pulsatile mass.     Tenderness: There is no abdominal tenderness.  Musculoskeletal: Normal range of motion.   Lymphadenopathy:     Cervical: No cervical adenopathy.  Skin:    General: Skin is warm and dry.  Neurological:     Mental Status: [name] alert and oriented to person, place, and time.     Deep Tendon Reflexes: Reflexes are normal and symmetric.  Psychiatric:        Behavior: Behavior normal.        Thought Content: Thought content normal.        Judgment: Judgment normal.    BP 112/75   Pulse 66   Temp 98.1 F (36.7 C) (Oral)   Ht '5\' 6"'$  (1.676 m)   Wt 248 lb (112.5 kg)   BMI 40.03 kg/m         Assessment & Plan:  [name] [name] comes in today with chief complaint of Medical Management of Chronic Issues   Diagnosis and orders addressed:  1. OSA (obstructive sleep apnea) Continue to wear CPAP  2. Hypothyroidism, unspecified type Labs pending - levothyroxine (SYNTHROID) 112 MCG tablet; Take 1 tablet (112 mcg total) by mouth daily before breakfast.  Dispense: 90 tablet; Refill: 1 - Thyroid Panel With TSH  3. Panic attacks Stress management  4. Metabolic syndrome FQMK1I has improved - Bayer DCA Hb A1c Waived - CMP14+EGFR - Lipid panel  5. SVT (supraventricular tachycardia) (HCC) Avoid caffeine - clonazePAM (KLONOPIN) 0.5 MG tablet; Take 1 tablet (0.5 mg total) by mouth 2 (two) times daily as needed.  Dispense: 60 tablet; Refill: 2  6. Migraine with status migrainosus, not intractable, unspecified migraine type - propranolol (INDERAL) 80 MG tablet; Take 1 tablet (80 mg total) by mouth daily.  Dispense: 90 tablet; Refill: 1  7. Gastroesophageal reflux disease without esophagitis Avoid spicy foods Do not eat 2 hours prior to bedtime - pantoprazole (PROTONIX) 40 MG tablet; TAKE 1 TABLET BY MOUTH TWICE DAILY - 30 MINUTES PRIOR TO BREAKFAST  Dispense: 60 tablet; Refill: 5 - metoCLOPramide (REGLAN) 10 MG tablet; Take 1 tablet (10 mg total) by mouth daily.  Dispense: 90 tablet; Refill: 1  8. Morbid obesity ([name]) Discussed diet and exercise for person with BMI >25 Will  recheck weight in 3-6 months  9.urinary urgency - oxybutynin (DITROPAN-XL) 5 MG 24 hr tablet; Take 1 tablet (5 mg total) by mouth at bedtime.  Dispense: 90 tablet; Refill: 1   Labs pending Health Maintenance reviewed Diet and exercise encouraged  Follow up plan: 3 months   Mary-Margaret Hassell Done, FNP

## 2018-07-22 LAB — CMP14+EGFR
ALT: 19 IU/L (ref 0–32)
AST: 17 IU/L (ref 0–40)
Albumin/Globulin Ratio: 1.7 (ref 1.2–2.2)
Albumin: 4.4 g/dL (ref 3.8–4.8)
Alkaline Phosphatase: 74 IU/L (ref 39–117)
BUN/Creatinine Ratio: 20 (ref 9–23)
BUN: 16 mg/dL (ref 6–24)
Bilirubin Total: 0.2 mg/dL (ref 0.0–1.2)
CO2: 20 mmol/L (ref 20–29)
Calcium: 9.7 mg/dL (ref 8.7–10.2)
Chloride: 107 mmol/L — ABNORMAL HIGH (ref 96–106)
Creatinine, Ser: 0.82 mg/dL (ref 0.57–1.00)
GFR calc Af Amer: 99 mL/min/{1.73_m2} (ref >59)
GFR calc non Af Amer: 86 mL/min/{1.73_m2} (ref >59)
Globulin, Total: 2.6 g/dL (ref 1.5–4.5)
Glucose: 71 mg/dL (ref 65–99)
Potassium: 3.9 mmol/L (ref 3.5–5.2)
Sodium: 142 mmol/L (ref 134–144)
Total Protein: 7 g/dL (ref 6.0–8.5)

## 2018-07-22 LAB — THYROID PANEL WITH TSH
Free Thyroxine Index: 1.9 (ref 1.2–4.9)
T3 Uptake Ratio: 24 % (ref 24–39)
T4, Total: 8 ug/dL (ref 4.5–12.0)
TSH: 5.04 u[IU]/mL — ABNORMAL HIGH (ref 0.450–4.500)

## 2018-07-22 LAB — LIPID PANEL
Chol/HDL Ratio: 4.6 ratio — ABNORMAL HIGH (ref 0.0–4.4)
Cholesterol, Total: 138 mg/dL (ref 100–199)
HDL: 30 mg/dL — ABNORMAL LOW (ref >39)
LDL Calculated: 70 mg/dL (ref 0–99)
Triglycerides: 191 mg/dL — ABNORMAL HIGH (ref 0–149)
VLDL Cholesterol Cal: 38 mg/dL (ref 5–40)

## 2018-08-02 ENCOUNTER — Other Ambulatory Visit: Payer: Self-pay | Admitting: Nurse Practitioner

## 2018-08-02 DIAGNOSIS — M545 Low back pain, unspecified: Secondary | ICD-10-CM

## 2018-08-29 ENCOUNTER — Other Ambulatory Visit: Payer: Self-pay | Admitting: *Deleted

## 2018-08-29 DIAGNOSIS — M545 Low back pain, unspecified: Secondary | ICD-10-CM

## 2018-08-30 MED ORDER — CYCLOBENZAPRINE HCL 10 MG PO TABS: 10.0000 mg | ORAL_TABLET | Freq: Three times a day (TID) | ORAL | 2 refills | Status: DC

## 2018-09-17 ENCOUNTER — Other Ambulatory Visit: Payer: Self-pay

## 2018-09-18 ENCOUNTER — Telehealth: Payer: Self-pay

## 2018-09-18 ENCOUNTER — Ambulatory Visit: Payer: 59 | Admitting: Nurse Practitioner

## 2018-09-18 ENCOUNTER — Encounter: Payer: Self-pay | Admitting: Nurse Practitioner

## 2018-09-18 VITALS — BP 117/77 | HR 67 | Temp 98.0°F | Ht 66.0 in | Wt 249.0 lb

## 2018-09-18 DIAGNOSIS — R0789 Other chest pain: Secondary | ICD-10-CM

## 2018-09-18 DIAGNOSIS — K219 Gastro-esophageal reflux disease without esophagitis: Secondary | ICD-10-CM | POA: Diagnosis not present

## 2018-09-18 MED ORDER — DEXLANSOPRAZOLE 60 MG PO CPDR: 60.0000 mg | DELAYED_RELEASE_CAPSULE | Freq: Every day | ORAL | 1 refills | Status: DC

## 2018-09-18 NOTE — Progress Notes (Signed)
   Subjective:    Patient ID: [name] [name], female    DOB: [birth year], 48 y.o.   MRN: [id number]   Chief Complaint: Gastroesophageal Reflux   HPI Patient is here today c/o chest pain. She has constant acid reflux. [name] on prilosec, protonix and reglan. She cannot eat anything without getting symptoms. When she eats spicy or fatty foods makes it worse.  Review of Systems  Constitutional: Negative.   Respiratory: Negative.   Cardiovascular: Positive for chest pain.  Gastrointestinal: Negative.   Neurological: Negative.   Psychiatric/Behavioral: Negative.   All other systems reviewed and are negative.      Objective:   Physical Exam Vitals signs and nursing note reviewed.  Constitutional:      Appearance: Normal appearance.  Cardiovascular:     Rate and Rhythm: Normal rate and regular rhythm.     Heart sounds: Normal heart sounds.  Pulmonary:     Effort: Pulmonary effort is normal.     Breath sounds: Normal breath sounds.  Abdominal:     General: Abdomen is flat. Bowel sounds are normal.     Tenderness: There is no abdominal tenderness.  Neurological:     General: No focal deficit present.     Mental Status: [name] alert and oriented to person, place, and time.  Psychiatric:        Mood and Affect: Mood normal.        Behavior: Behavior normal.       BP 117/77   Pulse 67   Temp 98 F (36.7 C) (Oral)   Ht 5\' 6"  (1.676 m)   Wt 249 lb (112.9 kg)   BMI 40.19 kg/m   EKG- NSR-Mary-Margaret Hassell Done, FNP     Assessment & Plan:  [name] [name] in today with chief complaint of Gastroesophageal Reflux   1. Chest discomfort - EKG 12-Lead  2. Gastroesophageal reflux disease, esophagitis presence not specified Avoid caffeine Avoid spicy and fatty foods until sees GI - dexlansoprazole (DEXILANT) 60 MG capsule; Take 1 capsule (60 mg total) by mouth daily.  Dispense: 30 capsule; Refill: 1 - Ambulatory referral to Gastroenterology  [place], FNP

## 2018-09-18 NOTE — Patient Instructions (Signed)

## 2018-09-18 NOTE — Telephone Encounter (Signed)
Phone screening complete 

## 2018-09-19 ENCOUNTER — Ambulatory Visit (INDEPENDENT_AMBULATORY_CARE_PROVIDER_SITE_OTHER): Payer: 59 | Admitting: Internal Medicine

## 2018-09-19 ENCOUNTER — Other Ambulatory Visit: Payer: Self-pay

## 2018-09-19 ENCOUNTER — Encounter: Payer: Self-pay | Admitting: Internal Medicine

## 2018-09-19 VITALS — Ht 66.0 in | Wt 249.0 lb

## 2018-09-19 DIAGNOSIS — K219 Gastro-esophageal reflux disease without esophagitis: Secondary | ICD-10-CM | POA: Diagnosis not present

## 2018-09-19 DIAGNOSIS — R079 Chest pain, unspecified: Secondary | ICD-10-CM

## 2018-09-19 DIAGNOSIS — Z8719 Personal history of other diseases of the digestive system: Secondary | ICD-10-CM | POA: Diagnosis not present

## 2018-09-19 DIAGNOSIS — Z8711 Personal history of peptic ulcer disease: Secondary | ICD-10-CM

## 2018-09-19 NOTE — Patient Instructions (Signed)
1.  Please come by our office on [facility] to the lab in our basement to have labs drawn  2.  You have been scheduled for an abdominal ultrasound at [facility] (1st floor of hospital) on 09/24/2018 at 8:30am. Please arrive 15 minutes prior to your appointment for registration. Make certain not to have anything to eat or drink after midnight prior to your appointment. Should you need to reschedule your appointment, please contact radiology at [phone]. This test typically takes about 30 minutes to perform.  3.  You have been scheduled for an endoscopy. Please follow written instructions given to you at your visit today. If you use inhalers (even only as needed), please bring them with you on the day of your procedure.

## 2018-09-19 NOTE — Addendum Note (Signed)
Addended by: Audrea Muscat on: 09/19/2018 03:12 PM   Modules accepted: Orders

## 2018-09-19 NOTE — Progress Notes (Signed)
HISTORY OF PRESENT ILLNESS:  [name] [name] is a 48 y.o. female with GERD, history of gastric ulcer, and obesity who schedules this WebEx telehealth AV visit during the coronavirus pandemic for evaluation of chest pain.  Patient has not been seen in this office since November 2016 when she underwent upper endoscopy for evaluation of acid reflux and epigastric pain.  She was found to have a gastric ulcer secondary to NSAIDs.  Testing for Helicobacter pylori was negative.  Patient tells me that she has had intermittent problems with right-sided chest pain for several years.  [name] actually undergone prior cardiac work-up which she reports is negative.  Also abdominal ultrasound from January 2017 was negative for gallstones.  General she would have an episode of pain every few months.  However, over the past few months, particular the past few weeks this has increased in frequency and severity.  She describes sharp pain in the right mid chest which radiates to the back/shoulder blade region.  There is no pleuritic component and no shortness of breath.  She does use Naprosyn infrequently for pain.  Her supervising physician has her on multiple acid suppressive agents including Protonix twice daily, omeprazole once daily, and Reglan once daily.  She denies any classic reflux symptoms such as pyrosis or regurgitation.  No dysphasia.  Symptoms are often exacerbated by meals.  In addition to her upper endoscopy in 2016, she underwent complete colonoscopy most recently February 2019 with multiple diminutive adenomas.  Review of outside office note from her primary provider Jul 21, 2018 revealed the patient's body weight to be 248 pounds with a BMI of 40.  Blood work at that time revealed unremarkable comprehensive metabolic panel with normal liver tests.  It was recommended that she try Dexilant but has not picked up the prescription.  She does have occasional mild constipation which she manages with increased fiber and  water intake.  REVIEW OF SYSTEMS:  All non-GI ROS negative unless otherwise stated in the HPI except for headaches, seasonal allergies  Past Medical History:  Diagnosis Date  . Allergy    SEASONAL  . Anxiety   . Chronic headaches   . Colon polyps   . GERD (gastroesophageal reflux disease)   . History of kidney stones   . Hyperlipidemia   . Hypothyroidism   . Palpitations   . Sleep apnea     Past Surgical History:  Procedure Laterality Date  . CESAREAN SECTION     1194  . COLONOSCOPY    . KNEE ARTHROSCOPY    . LITHOTRIPSY  12/2014    Social History [name] [name]  reports that she quit smoking about 5 months ago. Her smoking use included cigarettes. She started smoking about 23 months ago. She has a 3.50 pack-year smoking history. She has never used smokeless tobacco. She reports current alcohol use. She reports that she does not use drugs.  family history includes Breast cancer in her paternal grandmother; Heart disease in her maternal grandfather and maternal grandmother; Hypertension in her mother; Lung cancer in her father.  No Known Allergies     PHYSICAL EXAMINATION: Patient appears well.  Alert and oriented.  Cooperative. Showed the area of right chest that hurts No additional physical exam information with telehealth visit   ASSESSMENT:  1.  Intermittent worsening right-sided chest pain with radiation into the back.  I do not believe that this is reflux related.  Principal GI considerations would be gallbladder disease, despite previous negative ultrasound 3-1/2 years ago.  Non-GI consideration would be musculoskeletal pain such as Tietz syndrome. 2.  GERD.  Classic symptoms controlled with aggressive acid suppressive regimen.  Not sure she needs all of these medications moving forward 3.  History of NSAID induced gastric ulcer.  Continues to take NSAIDs though infrequently 4.  Obesity 5.  History of adenomatous colon polyps.  Last colonoscopy February  2019  PLAN:  1.  Laboratories: Obtain liver tests (LFTs).  Compared to previous normal. 2.  SCHEDULE abdominal ultrasound "right-sided pain, rule out gallstones" 3.  SCHEDULE EGD and LEC to further evaluate pain.  Rule out recurrent ulcer.The nature of the procedure, as well as the risks, benefits, and alternatives were carefully and thoroughly reviewed with the patient. Ample time for discussion and questions allowed. The patient understood, was satisfied, and agreed to proceed. 4.  Hold off on Dexilant for now 5.  If above work-up negative and pain persists consider CT of the chest This WebEx telehealth audiovisual visit was initiated by and consented for by the patient.  She was in her home and I was in my office throughout the encounter.  She understands her may be a professional charge associated with this comprehensive service which totaled 45 minutes

## 2018-09-24 ENCOUNTER — Other Ambulatory Visit (INDEPENDENT_AMBULATORY_CARE_PROVIDER_SITE_OTHER): Payer: 59

## 2018-09-24 ENCOUNTER — Ambulatory Visit (HOSPITAL_COMMUNITY)
Admission: RE | Admit: 2018-09-24 | Discharge: 2018-09-24 | Disposition: A | Discharge status: Home / Self Care | Payer: 59 | Source: Ambulatory Visit | Attending: Internal Medicine | Admitting: Internal Medicine

## 2018-09-24 ENCOUNTER — Other Ambulatory Visit: Payer: Self-pay

## 2018-09-24 DIAGNOSIS — K219 Gastro-esophageal reflux disease without esophagitis: Secondary | ICD-10-CM | POA: Insufficient documentation

## 2018-09-24 DIAGNOSIS — R079 Chest pain, unspecified: Secondary | ICD-10-CM

## 2018-09-24 DIAGNOSIS — Z8719 Personal history of other diseases of the digestive system: Secondary | ICD-10-CM | POA: Insufficient documentation

## 2018-09-24 DIAGNOSIS — Z8711 Personal history of peptic ulcer disease: Secondary | ICD-10-CM

## 2018-09-24 LAB — HEPATIC FUNCTION PANEL
ALT: 15 U/L (ref 0–35)
AST: 11 U/L (ref 0–37)
Albumin: 4.2 g/dL (ref 3.5–5.2)
Alkaline Phosphatase: 75 U/L (ref 39–117)
Bilirubin, Direct: 0 mg/dL (ref 0.0–0.3)
Total Bilirubin: 0.3 mg/dL (ref 0.2–1.2)
Total Protein: 7.2 g/dL (ref 6.0–8.3)

## 2018-10-03 ENCOUNTER — Telehealth: Payer: Self-pay | Admitting: Internal Medicine

## 2018-10-03 NOTE — Telephone Encounter (Signed)
Hi Dr. Henrene Pastor, this pt just cancelled her procedure with you on Tuesday 7/27 because she does not have a care partner. She will call back to reschedule. Thank you.

## 2018-10-07 ENCOUNTER — Encounter: Payer: 59 | Admitting: Internal Medicine

## 2018-10-09 ENCOUNTER — Telehealth: Payer: Self-pay | Admitting: *Deleted

## 2018-10-09 NOTE — Telephone Encounter (Signed)
  Follow up Call-  Call back number 04/18/2017  Post procedure Call Back phone  # [phone]  Permission to leave phone message Yes  Some recent data might be hidden     Patient questions:  Error pt canceled

## 2018-10-16 ENCOUNTER — Other Ambulatory Visit: Payer: Self-pay | Admitting: Nurse Practitioner

## 2018-10-30 ENCOUNTER — Other Ambulatory Visit: Payer: Self-pay | Admitting: Nurse Practitioner

## 2018-10-30 ENCOUNTER — Other Ambulatory Visit: Payer: Self-pay

## 2018-10-30 DIAGNOSIS — I471 Supraventricular tachycardia, unspecified: Secondary | ICD-10-CM

## 2018-10-30 DIAGNOSIS — M545 Low back pain, unspecified: Secondary | ICD-10-CM

## 2018-10-30 NOTE — Telephone Encounter (Signed)
Seen 09/18/18.addresss refills

## 2018-10-31 ENCOUNTER — Other Ambulatory Visit: Payer: Self-pay

## 2018-10-31 ENCOUNTER — Encounter: Payer: Self-pay | Admitting: Nurse Practitioner

## 2018-10-31 ENCOUNTER — Ambulatory Visit: Payer: 59 | Admitting: Nurse Practitioner

## 2018-10-31 VITALS — BP 114/75 | HR 80 | Temp 95.5°F | Ht 66.0 in | Wt 251.0 lb

## 2018-10-31 DIAGNOSIS — M779 Enthesopathy, unspecified: Secondary | ICD-10-CM

## 2018-10-31 DIAGNOSIS — M778 Other enthesopathies, not elsewhere classified: Secondary | ICD-10-CM

## 2018-10-31 MED ORDER — PREDNISONE 10 MG (21) PO TBPK: ORAL_TABLET | ORAL | 0 refills | Status: DC

## 2018-10-31 NOTE — Progress Notes (Signed)
   Subjective:    Patient ID: [name] [name], female    DOB: 1970/12/19, 48 y.o.   MRN: [id number]  . Chief Complaint: tendinitis (Right thumb x 6 hours. Patient states she has been seen for this years about but it has flaired back up )   HPI Patient coem sin c/o right thumb pain. Has been going on for several months. Has been wearing thumb spica splint and it is still hurting pain. Is radiating up her arm and she cannot lift anything without almost dropping it.   Review of Systems  Constitutional: Negative for activity change and appetite change.  HENT: Negative.   Eyes: Negative for pain.  Respiratory: Negative for shortness of breath.   Cardiovascular: Negative for chest pain, palpitations and leg swelling.  Gastrointestinal: Negative for abdominal pain.  Endocrine: Negative for polydipsia.  Genitourinary: Negative.   Skin: Negative for rash.  Neurological: Negative for dizziness, weakness and headaches.  Hematological: Does not bruise/bleed easily.  Psychiatric/Behavioral: Negative.   All other systems reviewed and are negative.      Objective:   Physical Exam Vitals signs and nursing note reviewed.  Cardiovascular:     Rate and Rhythm: Normal rate and regular rhythm.     Heart sounds: Normal heart sounds.  Pulmonary:     Breath sounds: Normal breath sounds.  Musculoskeletal:     Comments: Right thumb pain with any movement. Pain worse with opposition of fingers  Skin:    General: Skin is warm.  Neurological:     General: No focal deficit present.     Mental Status: [name] oriented to person, place, and time.  Psychiatric:        Mood and Affect: Mood normal.        Behavior: Behavior normal.    BP 114/75   Pulse 80   Temp (!) 95.5 F (35.3 C) (Temporal)   Ht 5\' 6"  (1.676 m)   Wt 251 lb (113.9 kg)   BMI 40.51 kg/m         Assessment & Plan:  [name] [name] in today with chief complaint of tendinitis (Right thumb x 6 hours. Patient states she has been seen  for this years about but it has flaired back up )   1. Thumb tendonitis Continue to wear spica splint - Ambulatory referral to Orthopedic Surgery - predniSONE (STERAPRED UNI-PAK 21 TAB) 10 MG (21) TBPK tablet; As directed x 6 days  Dispense: 21 tablet; Refill: 0  Mary-Margaret Hassell Done, FNP

## 2018-10-31 NOTE — Patient Instructions (Signed)
Tendinitis  Tendinitis is inflammation of a tendon. A tendon is a strong cord of tissue that connects muscle to bone. Tendinitis can affect any tendon, but it most commonly affects the:  Shoulder tendon (rotator cuff).  Ankle tendon (Achilles tendon).  Elbow tendon (triceps tendon).  Tendons in the wrist. What are the causes? This condition may be caused by:  Overusing a tendon or muscle. This is common.  Age-related wear and tear.  Injury.  Inflammatory conditions, such as arthritis.  Certain medicines. What increases the risk? You are more likely to develop this condition if you do activities that involve the same movements over and over again (repetitive motions). What are the signs or symptoms? Symptoms of this condition may include:  Pain.  Tenderness.  Mild swelling.  Decreased range of motion. How is this diagnosed? This condition is diagnosed with a physical exam. You may also have tests, such as:  Ultrasound. This uses sound waves to make an image of the inside of your body in the affected area.  MRI. How is this treated? This condition may be treated by resting, icing, applying pressure (compression), and raising (elevating) the affected area above the level of your heart. This is known as RICE therapy. Treatment may also include:  Medicines to help reduce inflammation or to help reduce pain.  Exercises or physical therapy to strengthen and stretch the tendon.  A brace or splint.  Surgery. This is rarely needed. Follow these instructions at home: If you have a splint or brace:  Wear the splint or brace as told by your health care provider. Remove it only as told by your health care provider.  Loosen the splint or brace if your fingers or toes tingle, become numb, or turn cold and blue.  Keep the splint or brace clean.  If the splint or brace is not waterproof: ? Do not let it get wet. ? Cover it with a watertight covering when you take a bath  or shower. Managing pain, stiffness, and swelling  If directed, put ice on the affected area. ? If you have a removable splint or brace, remove it as told by your health care provider. ? Put ice in a plastic bag. ? Place a towel between your skin and the bag. ? Leave the ice on for 20 minutes, 2-3 times a day.  Move the fingers or toes of the affected limb often, if this applies. This can help to prevent stiffness and lessen swelling.  If directed, raise (elevate) the affected area above the level of your heart while you are sitting or lying down.  If directed, apply heat to the affected area before you exercise. Use the heat source that your health care provider recommends, such as a moist heat pack or a heating pad.     ? Place a towel between your skin and the heat source. ? Leave the heat on for 20-30 minutes. ? Remove the heat if your skin turns bright red. This is especially important if you are unable to feel pain, heat, or cold. You may have a greater risk of getting burned. Driving  Do not drive or use heavy machinery while taking prescription pain medicine.  Ask your health care provider when it is safe to drive if you have a splint or brace on any part of your arm or leg. Activity  Rest the affected area as told by your health care provider.  Return to your normal activities as told by your health care   provider. Ask your health care provider what activities are safe for you.  Avoid using the affected area while you are experiencing symptoms of tendinitis.  Do exercises as told by your health care provider. General instructions  If you have a splint, do not put pressure on any part of the splint until it is fully hardened. This may take several hours.  Wear an elastic bandage or compression wrap only as told by your health care provider.  Take over-the-counter and prescription medicines only as told by your health care provider.  Keep all follow-up visits as told  by your health care provider. This is important. Contact a health care provider if:  Your symptoms do not improve.  You develop new, unexplained problems, such as numbness in your hands. Summary  Tendinitis is inflammation of a tendon.  You are more likely to develop this condition if you do activities that involve the same movements over and over again.  This condition may be treated by resting, icing, applying pressure (compression), and elevating the area above the level of your heart. This is known as RICE therapy.  Avoid using the affected area while you are experiencing symptoms of tendinitis. This information is not intended to replace advice given to you by your health care provider. Make sure you discuss any questions you have with your health care provider. Document Released: 02/24/2000 Document Revised: 09/03/2017 Document Reviewed: 07/17/2017 Elsevier Patient Education  2020 Elsevier Inc.  

## 2018-11-09 ENCOUNTER — Other Ambulatory Visit: Payer: Self-pay | Admitting: Nurse Practitioner

## 2018-11-13 ENCOUNTER — Encounter: Payer: Self-pay | Admitting: Orthopaedic Surgery

## 2018-11-13 ENCOUNTER — Ambulatory Visit (INDEPENDENT_AMBULATORY_CARE_PROVIDER_SITE_OTHER): Payer: 59 | Admitting: Orthopaedic Surgery

## 2018-11-13 ENCOUNTER — Other Ambulatory Visit: Payer: Self-pay

## 2018-11-13 ENCOUNTER — Ambulatory Visit: Payer: Self-pay

## 2018-11-13 VITALS — BP 176/100 | HR 79 | Ht 66.0 in | Wt 250.0 lb

## 2018-11-13 DIAGNOSIS — M25531 Pain in right wrist: Secondary | ICD-10-CM

## 2018-11-13 DIAGNOSIS — M792 Neuralgia and neuritis, unspecified: Secondary | ICD-10-CM

## 2018-11-13 NOTE — Progress Notes (Signed)
Office Visit Note   Patient: [name] [name]           Date of Birth: 05-06-1970           MRN: [id number] Visit Date: 11/13/2018              Requested by: Chevis Pretty, [address] [place],  [place] [address] PCP: Chevis Pretty, FNP   Assessment & Plan: Visit Diagnoses:  1. Radicular pain in right arm   2. Pain in right wrist     Plan: Patient can continue intermittent splinting and Naprosyn.  We reviewed cervical spine x-rays findings.  I plan to check her back in 2 months if she has increased symptoms she will call and let us know.  Recommend she try just one prednisone tablet daily for a couple days if this is effective she can take 2 tabs for 2 days then back to 1 tablet and then stop.  I discussed with her that this appears to be radicular pain she has persistent symptoms on return we can consider MRI scan.  Follow-Up Instructions: Return in about 2 months (around 01/13/2019).   Orders:  Orders Placed This Encounter  Procedures  . XR Cervical Spine Complete   No orders of the defined types were placed in this encounter.     Procedures: No procedures performed   Clinical Data: No additional findings.   Subjective: Chief Complaint  Patient presents with  . Right Hand - Pain    HPI 48 year old female seen with pain in the right thumb.  Patient been treated on thumb spica splint was given prednisone but had side effects and had to stop.  Patient has pain that radiates down her arm she is right-hand dominant and states this is been going on for years but most recently has been the most severe.  She has neck pain decreased range of motion of the hand and pain with gripping.  Review of Systems 14 point review of systems positive history of GE reflux without esophagitis.  Hidradenitis, migraines, morbid obesity, panic attacks, urinary urgency.  Otherwise negative is obtains HPI 14 point system update.   Objective: Vital Signs: BP (!) 176/100    Pulse 79   Ht 5\' 6"  (1.676 m)   Wt 250 lb (113.4 kg)   BMI 40.35 kg/m   Physical Exam Constitutional:      Appearance: [name] well-developed.  HENT:     Head: Normocephalic.     Right Ear: External ear normal.     Left Ear: External ear normal.  Eyes:     Pupils: Pupils are equal, round, and reactive to light.  Neck:     Thyroid: No thyromegaly.     Trachea: No tracheal deviation.  Cardiovascular:     Rate and Rhythm: Normal rate.  Pulmonary:     Effort: Pulmonary effort is normal.  Abdominal:     Palpations: Abdomen is soft.  Skin:    General: Skin is warm and dry.  Neurological:     Mental Status: [name] alert and oriented to person, place, and time.  Psychiatric:        Behavior: Behavior normal.     Ortho Exam patient has some pain over the first dorsal compartment also some tenderness first CMC joint.  No snuffbox tenderness tuberosity scaphoid normal.  Mildly positive Finkelstein test.  No thenar weakness interossei are strong.  Patient has right and left brachial plexus tenderness worse on the right than left.  Normal lower extremity reflexes.  Positive Spurling on the right negative on the left.  Specialty Comments:  No specialty comments available.  Imaging: No results found.   PMFS History: Patient Active Problem List   Diagnosis Date Noted  . Pain in right wrist 11/18/2018  . Gastroesophageal reflux disease without esophagitis 07/21/2018  . Urinary urgency 07/21/2018  . Hidradenitis suppurativa 10/01/2016  . OSA (obstructive sleep apnea) 05/13/2015  . Morbid obesity ([place]) 05/13/2015  . Lumbago 11/11/2014  . Hypothyroidism 03/27/2014  . Panic attacks 03/27/2014  . SVT (supraventricular tachycardia) ([place]) 06/26/2012  . Migraines 06/26/2012   Past Medical History:  Diagnosis Date  . Allergy    SEASONAL  . Anxiety   . Chronic headaches   . Colon polyps   . GERD (gastroesophageal reflux disease)   . History of kidney stones   . Hyperlipidemia    . Hypothyroidism   . Palpitations   . Sleep apnea     Family History  Problem Relation Age of Onset  . Heart disease Maternal Grandfather   . Heart disease Maternal Grandmother   . Lung cancer Father        died from  . Breast cancer Paternal Grandmother        paternal aunts x2  . Hypertension Mother   . Colon cancer Neg Hx   . Esophageal cancer Neg Hx   . Rectal cancer Neg Hx   . Stomach cancer Neg Hx     Past Surgical History:  Procedure Laterality Date  . CESAREAN SECTION     1194  . COLONOSCOPY    . KNEE ARTHROSCOPY    . LITHOTRIPSY  12/2014   Social History   Occupational History    Employer: [employer]  Tobacco Use  . Smoking status: Former Smoker    Packs/day: 0.50    Years: 7.00    Pack years: 3.50    Types: Cigarettes    Start date: 10/10/2016    Quit date: 04/05/2018    Years since quitting: 0.6  . Smokeless tobacco: Never Used  . Tobacco comment: patient has patches but has not began yet, has program through her employer  Substance and Sexual Activity  . Alcohol use: Yes    Alcohol/week: 0.0 standard drinks    Comment: rare  . Drug use: No  . Sexual activity: Not on file

## 2018-11-18 ENCOUNTER — Encounter: Payer: Self-pay | Admitting: Orthopaedic Surgery

## 2018-11-18 DIAGNOSIS — M25531 Pain in right wrist: Secondary | ICD-10-CM | POA: Insufficient documentation

## 2019-01-05 DIAGNOSIS — R928 Other abnormal and inconclusive findings on diagnostic imaging of breast: Secondary | ICD-10-CM

## 2019-01-05 NOTE — Progress Notes (Signed)
Screening mammogram report - abnormal/incomplete additional imaging recommended.  Orders place, signed copy sent to [facility]. They will contact patient to set up appt. MPulliam, CMA/RT(R)

## 2019-01-07 ENCOUNTER — Encounter (HOSPITAL_COMMUNITY): Payer: Self-pay

## 2019-01-07 ENCOUNTER — Other Ambulatory Visit: Payer: Self-pay

## 2019-01-07 ENCOUNTER — Emergency Department (HOSPITAL_COMMUNITY)
Admission: EM | Admit: 2019-01-07 | Discharge: 2019-01-07 | Disposition: A | Discharge status: Home / Self Care | Payer: 59 | Attending: Emergency Medicine | Admitting: Emergency Medicine

## 2019-01-07 ENCOUNTER — Emergency Department (HOSPITAL_COMMUNITY): Payer: 59

## 2019-01-07 DIAGNOSIS — R0609 Other forms of dyspnea: Secondary | ICD-10-CM | POA: Insufficient documentation

## 2019-01-07 DIAGNOSIS — R531 Weakness: Secondary | ICD-10-CM | POA: Diagnosis not present

## 2019-01-07 DIAGNOSIS — E039 Hypothyroidism, unspecified: Secondary | ICD-10-CM | POA: Diagnosis not present

## 2019-01-07 DIAGNOSIS — Z87891 Personal history of nicotine dependence: Secondary | ICD-10-CM | POA: Insufficient documentation

## 2019-01-07 DIAGNOSIS — R079 Chest pain, unspecified: Secondary | ICD-10-CM | POA: Diagnosis present

## 2019-01-07 LAB — BASIC METABOLIC PANEL
Anion gap: 10 (ref 5–15)
BUN: 13 mg/dL (ref 6–20)
CO2: 21 mmol/L — ABNORMAL LOW (ref 22–32)
Calcium: 9.2 mg/dL (ref 8.9–10.3)
Chloride: 109 mmol/L (ref 98–111)
Creatinine, Ser: 1.03 mg/dL — ABNORMAL HIGH (ref 0.44–1.00)
GFR calc Af Amer: >60 mL/min (ref >60)
GFR calc non Af Amer: >60 mL/min (ref >60)
Glucose, Bld: 95 mg/dL (ref 70–99)
Potassium: 3.7 mmol/L (ref 3.5–5.1)
Sodium: 140 mmol/L (ref 135–145)

## 2019-01-07 LAB — TROPONIN I (HIGH SENSITIVITY): Troponin I (High Sensitivity): <2 ng/L (ref <18)

## 2019-01-07 LAB — I-STAT BETA HCG BLOOD, ED (MC, WL, AP ONLY): I-stat hCG, quantitative: <5 m[IU]/mL (ref <5)

## 2019-01-07 LAB — CBC
HCT: 46 % (ref 36.0–46.0)
Hemoglobin: 14.8 g/dL (ref 12.0–15.0)
MCH: 28.4 pg (ref 26.0–34.0)
MCHC: 32.2 g/dL (ref 30.0–36.0)
MCV: 88.1 fL (ref 80.0–100.0)
Platelets: 290 10*3/uL (ref 150–400)
RBC: 5.22 MIL/uL — ABNORMAL HIGH (ref 3.87–5.11)
RDW: 14.6 % (ref 11.5–15.5)
WBC: 11.2 10*3/uL — ABNORMAL HIGH (ref 4.0–10.5)
nRBC: 0 % (ref 0.0–0.2)

## 2019-01-07 MED ORDER — SODIUM CHLORIDE 0.9% FLUSH: 3.0000 mL | Freq: Once | INTRAVENOUS | Status: DC

## 2019-01-07 NOTE — ED Provider Notes (Signed)
[facility] Emergency Department Provider Note MRN:  [id number]  Arrival date & time: 01/07/19     Chief Complaint   Chest Pain and Weakness   History of Present Illness   [name] [name] is a 48 y.o. year-old female with no pertinent past medical history presenting to the ED with chief complaint of chest pain and weakness.  2 days of dyspnea on exertion, malaise, fatigue, intermittent chest pressure, denies fever, no cough, no abdominal pain, no dysuria.  Symptoms mild to moderate in severity, no exacerbating or alleviating factors.  Told to come to the emergency department after labs done as outpatient reveal elevated potassium.  Review of Systems  A complete 10 system review of systems was obtained and all systems are negative except as noted in the HPI and PMH.   Patient's Health History    Past Medical History:  Diagnosis Date  . Allergy    SEASONAL  . Anxiety   . Chronic headaches   . Colon polyps   . GERD (gastroesophageal reflux disease)   . History of kidney stones   . Hyperlipidemia   . Hypothyroidism   . Palpitations   . Sleep apnea     Past Surgical History:  Procedure Laterality Date  . CESAREAN SECTION     1194  . COLONOSCOPY    . KNEE ARTHROSCOPY    . LITHOTRIPSY  12/2014    Family History  Problem Relation Age of Onset  . Heart disease Maternal Grandfather   . Heart disease Maternal Grandmother   . Lung cancer Father        died from  . Breast cancer Paternal Grandmother        paternal aunts x2  . Hypertension Mother   . Colon cancer Neg Hx   . Esophageal cancer Neg Hx   . Rectal cancer Neg Hx   . Stomach cancer Neg Hx     Social History   Socioeconomic History  . Marital status: Divorced    Spouse name: Not on file  . Number of children: 1  . Years of education: Not on file  . Highest education level: Not on file  Occupational History    Employer: [employer]  Social Needs  . Financial resource strain:  Not on file  . Food insecurity    Worry: Not on file    Inability: Not on file  . Transportation needs    Medical: Not on file    Non-medical: Not on file  Tobacco Use  . Smoking status: Former Smoker    Packs/day: 0.50    Years: 7.00    Pack years: 3.50    Types: Cigarettes    Start date: 10/10/2016    Quit date: 04/05/2018    Years since quitting: 0.7  . Smokeless tobacco: Never Used  . Tobacco comment: patient has patches but has not began yet, has program through her employer  Substance and Sexual Activity  . Alcohol use: Yes    Alcohol/week: 0.0 standard drinks    Comment: rare  . Drug use: No  . Sexual activity: Not on file  Lifestyle  . Physical activity    Days per week: Not on file    Minutes per session: Not on file  . Stress: Not on file  Relationships  . Social Herbalist on phone: Not on file    Gets together: Not on file    Attends religious service: Not on file  Active member of club or organization: Not on file    Attends meetings of clubs or organizations: Not on file    Relationship status: Not on file  . Intimate partner violence    Fear of current or ex partner: Not on file    Emotionally abused: Not on file    Physically abused: Not on file    Forced sexual activity: Not on file  Other Topics Concern  . Not on file  Social History Narrative  . Not on file     Physical Exam  Vital Signs and Nursing Notes reviewed Vitals:   01/07/19 1341 01/07/19 1430  BP: (!) 130/91 137/79  Pulse: 89 78  Resp: 18 15  Temp: 98.4 F (36.9 C)   SpO2: 98% 99%    CONSTITUTIONAL: Well-appearing, NAD NEURO:  Alert and oriented x 3, no focal deficits EYES:  eyes equal and reactive ENT/NECK:  no LAD, no JVD CARDIO: Regular rate, well-perfused, normal S1 and S2 PULM:  CTAB no wheezing or rhonchi GI/GU:  normal bowel sounds, non-distended, non-tender MSK/SPINE:  No gross deformities, no edema SKIN:  no rash, atraumatic PSYCH:  Appropriate speech  and behavior  Diagnostic and Interventional Summary    EKG Interpretation  Date/Time:  Wednesday January 07 2019 13:44:48 EDT Ventricular Rate:  79 PR Interval:    QRS Duration: 117 QT Interval:  394 QTC Calculation: 452 R Axis:   -13 Text Interpretation: Sinus rhythm Nonspecific intraventricular conduction delay Low voltage, precordial leads Nonspecific T abnormalities, anterior leads Confirmed by Gerlene Fee [phone]) on 01/07/2019 2:13:30 PM      Labs Reviewed  BASIC METABOLIC PANEL - Abnormal; Notable for the following components:      Result Value   CO2 21 (*)    Creatinine, Ser 1.03 (*)    All other components within normal limits  CBC - Abnormal; Notable for the following components:   WBC 11.2 (*)    RBC 5.22 (*)    All other components within normal limits  I-STAT BETA HCG BLOOD, ED (MC, WL, AP ONLY)  TROPONIN I (HIGH SENSITIVITY)  TROPONIN I (HIGH SENSITIVITY)    DG Chest 2 View  Final Result      Medications - No data to display   Procedures  /  Critical Care Procedures  ED Course and Medical Decision Making  I have reviewed the triage vital signs and the nursing notes.  Pertinent labs & imaging results that were available during my care of the patient were reviewed by me and considered in my medical decision making (see below for details).  Question of new onset kidney failure as the etiology of patient's symptoms.  Also considering ACS, less likely PE as patient is PERC negative.  Waiting laboratory results.  Troponin is negative, labs are normal, normal potassium.  Suspect outpatient labs were hemolyzed.  Patient continues to look and feel well, normal vital signs, no increased work of breathing, appropriate for discharge with PCP follow-up.  Barth Kirks. [place]onia Small, [place] [email or web address]  Final Clinical Impressions(s) / ED Diagnoses     ICD-10-CM   1. Dyspnea on exertion  R06.00     ED  Discharge Orders    None      Discharge Instructions Discussed with and Provided to Patient:   Discharge Instructions     You were evaluated in the Emergency Department and after careful evaluation, we did not find any emergent condition requiring admission or further  testing in the hospital.  Your exam/testing today is overall reassuring.  Please return to the Emergency Department if you experience any worsening of your condition.  We encourage you to follow up with a primary care provider.  Thank you for allowing [place] to be a part of your care.       Maudie Flakes, MD 01/07/19 [phone]

## 2019-01-07 NOTE — ED Triage Notes (Signed)
Pt states that she has been having SHOB and palpitations. Pt states that she had an abnormal EKG and elevated potassium the other day and was concerned. Pt states she is very fatigued and generally weak.

## 2019-01-07 NOTE — Discharge Instructions (Addendum)
You were evaluated in the Emergency Department and after careful evaluation, we did not find any emergent condition requiring admission or further testing in the hospital.  Your exam/testing today is overall reassuring.  Please return to the Emergency Department if you experience any worsening of your condition.  We encourage you to follow up with a primary care provider.  Thank you for allowing us to be a part of your care. 

## 2019-01-15 ENCOUNTER — Ambulatory Visit: Payer: 59 | Admitting: Orthopaedic Surgery

## 2019-01-22 ENCOUNTER — Encounter: Payer: Self-pay | Admitting: Orthopaedic Surgery

## 2019-01-22 ENCOUNTER — Ambulatory Visit (INDEPENDENT_AMBULATORY_CARE_PROVIDER_SITE_OTHER): Payer: 59 | Admitting: Orthopaedic Surgery

## 2019-01-22 DIAGNOSIS — M7541 Impingement syndrome of right shoulder: Secondary | ICD-10-CM | POA: Diagnosis not present

## 2019-01-22 MED ORDER — METHYLPREDNISOLONE ACETATE 40 MG/ML IJ SUSP
40.0000 mg | INTRAMUSCULAR | Status: AC | PRN
  Administered 2019-01-22: 40 mg via INTRA_ARTICULAR

## 2019-01-22 MED ORDER — BUPIVACAINE HCL 0.25 % IJ SOLN
4.0000 mL | INTRAMUSCULAR | Status: AC | PRN
  Administered 2019-01-22: 16:00:00 4 mL via INTRA_ARTICULAR

## 2019-01-22 MED ORDER — LIDOCAINE HCL 1 % IJ SOLN
0.5000 mL | INTRAMUSCULAR | Status: AC | PRN
  Administered 2019-01-22: 16:00:00 .5 mL

## 2019-01-22 NOTE — Progress Notes (Signed)
Office Visit Note   Patient: [name] [name]           Date of Birth: Mar 31, 1970           MRN: [id number] Visit Date: 01/22/2019              Requested by: Chevis Pretty, [address] [place],  [place] [address] PCP: Chevis Pretty, FNP   Assessment & Plan: Visit Diagnoses:  1. Impingement syndrome of right shoulder     Plan: Subacromial injection performed which she tolerated well.  We will see how she does with this if she has persistent problems will need to consider MRI scan we discussed MRI of the cervical spine.  Previous x-rays showed minimal changes.  She has good range of motion of her neck today and had a period that this more likely is having more a component of pain from her shoulder and from impingement.  Return in a few weeks she will call if she is having persistent problems.  Follow-Up Instructions: No follow-ups on file.   Orders:  Orders Placed This Encounter  Procedures  . Large Joint Inj   No orders of the defined types were placed in this encounter.     Procedures: Large Joint Inj: R subacromial bursa on 01/22/2019 4:24 PM Indications: pain Details: 22 G 1.5 in needle  Arthrogram: No  Medications: 4 mL bupivacaine 0.25 %; 40 mg methylPREDNISolone acetate 40 MG/ML; 0.5 mL lidocaine 1 % Outcome: tolerated well, no immediate complications Procedure, treatment alternatives, risks and benefits explained, specific risks discussed. Consent was given by the patient. Immediately prior to procedure a time out was called to verify the correct patient, procedure, equipment, support staff and site/side marked as required. Patient was prepped and draped in the usual sterile fashion.       Clinical Data: No additional findings.   Subjective: Chief Complaint  Patient presents with  . Right Arm - Pain, Follow-up    HPI 48 year old female returns with ongoing sharp pain in her right side of her neck that radiates into her arm mostly the  elbow occasionally she has at the forearm but primarily stops short.  Pain is reproduced with that stretched abduction of the right arm.  She has been through therapy, with dry needling and feels that this is significantly helped.  She has had some pain in the long finger PIP joint with stiffness.  Sometimes she has trouble flexing it but it never locked in a flexed position.  No gait disturbance no fever chills no bowel bladder.  Pain seems to be improved in her hand at this time.  Symptoms.  Review of Systems 14 point systems updated and is unchanged from 11/13/2018 office visit.  Objective: Vital Signs: BP 105/66   Pulse 69   Ht 5\' 6"  (1.676 m)   Wt 254 lb (115.2 kg)   BMI 41.00 kg/m   Physical Exam Constitutional:      Appearance: [name] well-developed.  HENT:     Head: Normocephalic.     Right Ear: External ear normal.     Left Ear: External ear normal.  Eyes:     Pupils: Pupils are equal, round, and reactive to light.  Neck:     Thyroid: No thyromegaly.     Trachea: No tracheal deviation.  Cardiovascular:     Rate and Rhythm: Normal rate.  Pulmonary:     Effort: Pulmonary effort is normal.  Abdominal:     Palpations: Abdomen is  soft.  Skin:    General: Skin is warm and dry.  Neurological:     Mental Status: [name] alert and oriented to person, place, and time.  Psychiatric:        Behavior: Behavior normal.     Ortho Exam patient has pain with resisted abduction negative drop arm test positive Neer negative Hawkins long head of the biceps is normal she has some brachial plexus tenderness some discomfort with Spurling on the right.  Some trapezial tenderness.  With Neer test that radiates in the supraspinatus fossa similar to the pain that she has been having.  Specialty Comments:  No specialty comments available.  Imaging: No results found.   PMFS History: Patient Active Problem List   Diagnosis Date Noted  . Impingement syndrome of right shoulder 01/22/2019   . Pain in right wrist 11/18/2018  . Gastroesophageal reflux disease without esophagitis 07/21/2018  . Urinary urgency 07/21/2018  . Hidradenitis suppurativa 10/01/2016  . OSA (obstructive sleep apnea) 05/13/2015  . Morbid obesity ([place]) 05/13/2015  . Lumbago 11/11/2014  . Hypothyroidism 03/27/2014  . Panic attacks 03/27/2014  . SVT (supraventricular tachycardia) ([place]) 06/26/2012  . Migraines 06/26/2012   Past Medical History:  Diagnosis Date  . Allergy    SEASONAL  . Anxiety   . Chronic headaches   . Colon polyps   . GERD (gastroesophageal reflux disease)   . History of kidney stones   . Hyperlipidemia   . Hypothyroidism   . Palpitations   . Sleep apnea     Family History  Problem Relation Age of Onset  . Heart disease Maternal Grandfather   . Heart disease Maternal Grandmother   . Lung cancer Father        died from  . Breast cancer Paternal Grandmother        paternal aunts x2  . Hypertension Mother   . Colon cancer Neg Hx   . Esophageal cancer Neg Hx   . Rectal cancer Neg Hx   . Stomach cancer Neg Hx     Past Surgical History:  Procedure Laterality Date  . CESAREAN SECTION     1194  . COLONOSCOPY    . KNEE ARTHROSCOPY    . LITHOTRIPSY  12/2014   Social History   Occupational History    Employer: [employer]  Tobacco Use  . Smoking status: Former Smoker    Packs/day: 0.50    Years: 7.00    Pack years: 3.50    Types: Cigarettes    Start date: 10/10/2016    Quit date: 04/05/2018    Years since quitting: 0.8  . Smokeless tobacco: Never Used  . Tobacco comment: patient has patches but has not began yet, has program through her employer  Substance and Sexual Activity  . Alcohol use: Yes    Alcohol/week: 0.0 standard drinks    Comment: rare  . Drug use: No  . Sexual activity: Not on file

## 2019-01-29 ENCOUNTER — Other Ambulatory Visit: Payer: Self-pay | Admitting: Nurse Practitioner

## 2019-01-29 DIAGNOSIS — I471 Supraventricular tachycardia: Secondary | ICD-10-CM

## 2019-01-29 DIAGNOSIS — M545 Low back pain, unspecified: Secondary | ICD-10-CM

## 2019-02-03 ENCOUNTER — Encounter: Payer: Self-pay | Admitting: Family Medicine

## 2019-02-13 ENCOUNTER — Telehealth: Payer: Self-pay | Admitting: Orthopaedic Surgery

## 2019-02-13 NOTE — Telephone Encounter (Signed)
Patient called wanting to know if you have received the results of her MRI.  CB#[phone].  Thank you

## 2019-02-13 NOTE — Telephone Encounter (Signed)
MRI Cervical Spine results placed at your desk for review.

## 2019-02-13 NOTE — Telephone Encounter (Signed)
I called discussed. She will make ROV [place] to review.

## 2019-02-19 ENCOUNTER — Other Ambulatory Visit: Payer: Self-pay

## 2019-02-19 ENCOUNTER — Ambulatory Visit: Payer: Self-pay

## 2019-02-19 ENCOUNTER — Encounter: Payer: Self-pay | Admitting: Orthopaedic Surgery

## 2019-02-19 ENCOUNTER — Ambulatory Visit (INDEPENDENT_AMBULATORY_CARE_PROVIDER_SITE_OTHER): Payer: 59 | Admitting: Orthopaedic Surgery

## 2019-02-19 VITALS — BP 118/82 | HR 77 | Ht 66.0 in | Wt 250.0 lb

## 2019-02-19 DIAGNOSIS — M25511 Pain in right shoulder: Secondary | ICD-10-CM

## 2019-02-19 DIAGNOSIS — M7541 Impingement syndrome of right shoulder: Secondary | ICD-10-CM

## 2019-02-19 NOTE — Progress Notes (Signed)
Office Visit Note   Patient: [name] [name]           Date of Birth: 26-Jul-1970           MRN: [id number] Visit Date: 02/19/2019              Requested by: Chevis Pretty, [address] [place],  [place] [address] PCP: Chevis Pretty, FNP   Assessment & Plan: Visit Diagnoses:  1. Acute pain of right shoulder   2. Impingement syndrome of right shoulder     Plan: Patient has 2 problems one of the cervical spine but it appears that her principal pain generator is impingement in her shoulder which got great relief for 1 to 2 days post injection and then she had recurrence of her pain again.  Will obtain an MRI scan of the right shoulder rule out rotator cuff tear with the pain with range of motion pain that wakes her up at night failure to respond to anti-inflammatories activity modification exercise program and also subacromial injection.  We reviewed her previous MRI scan from 11/24's 20 which showed some central disc protrusion at C6-7 which tracks caudally but did not compress the cord.  Disc was central and not really right side that corresponds with her symptoms.  Office follow-up after shoulder MRI.  Follow-Up Instructions: No follow-ups on file.   Orders:  Orders Placed This Encounter  Procedures   XR Shoulder Right   MR SHOULDER RIGHT WO CONTRAST   No orders of the defined types were placed in this encounter.     Procedures: No procedures performed   Clinical Data: No additional findings.   Subjective: Chief Complaint  Patient presents with   Right Shoulder - Follow-up, Pain    MRI Review    HPI 48 year old female returns with ongoing problems with right shoulder pain much more significant than neck pain.  She states whenever she moves her arm such as using it can help and her pain is severe.  Pain with outstretch reaching.  Minimal numbness or tingling in her fingers.  When she moves her shoulder quickly she feels like a sharp stabbing  type pain.  She has mild discomfort with range of motion of her neck.  Review of Systems 14 point systems updated unchanged from 11/13/2018 other than as mentioned above in HPI.  Objective: Vital Signs: BP 118/82    Pulse 77    Ht 5\' 6"  (1.676 m)    Wt 250 lb (113.4 kg)    BMI 40.35 kg/m   Physical Exam Constitutional:      Appearance: [name] well-developed.  HENT:     Head: Normocephalic.     Right Ear: External ear normal.     Left Ear: External ear normal.  Eyes:     Pupils: Pupils are equal, round, and reactive to light.  Neck:     Thyroid: No thyromegaly.     Trachea: No tracheal deviation.  Cardiovascular:     Rate and Rhythm: Normal rate.  Pulmonary:     Effort: Pulmonary effort is normal.  Abdominal:     Palpations: Abdomen is soft.  Skin:    General: Skin is warm and dry.  Neurological:     Mental Status: [name] alert and oriented to person, place, and time.  Psychiatric:        Behavior: Behavior normal.     Ortho Exam patient has mild brachial plexus tenderness positive impingement with resisted supraspinatus testing this reproduces  her pain long of the biceps minimally tender upper extremity reflexes are 2+ and symmetrical.  She does have some decreased sensation C6 distribution right hand normal on the left hand.  Specialty Comments:  No specialty comments available.  Imaging: XR Shoulder Right  Result Date: 02/19/2019 2 view x-rays right shoulder demonstrates normal acromioclavicular joint normal glenohumeral joint no soft tissue calcification.  Portion of lung field seen is normal. Impression: Normal right shoulder x-rays.    PMFS History: Patient Active Problem List   Diagnosis Date Noted   Impingement syndrome of right shoulder 01/22/2019   Pain in right wrist 11/18/2018   Gastroesophageal reflux disease without esophagitis 07/21/2018   Urinary urgency 07/21/2018   Hidradenitis suppurativa 10/01/2016   OSA (obstructive sleep apnea)  05/13/2015   Morbid obesity ([place]) 05/13/2015   Lumbago 11/11/2014   Hypothyroidism 03/27/2014   Panic attacks 03/27/2014   SVT (supraventricular tachycardia) ([place]) 06/26/2012   Migraines 06/26/2012   Past Medical History:  Diagnosis Date   Allergy    SEASONAL   Anxiety    Chronic headaches    Colon polyps    GERD (gastroesophageal reflux disease)    History of kidney stones    Hyperlipidemia    Hypothyroidism    Palpitations    Sleep apnea     Family History  Problem Relation Age of Onset   Heart disease Maternal Grandfather    Heart disease Maternal Grandmother    Lung cancer Father        died from   Breast cancer Paternal Grandmother        paternal aunts x2   Hypertension Mother    Colon cancer Neg Hx    Esophageal cancer Neg Hx    Rectal cancer Neg Hx    Stomach cancer Neg Hx     Past Surgical History:  Procedure Laterality Date   CESAREAN SECTION     1194   COLONOSCOPY     KNEE ARTHROSCOPY     LITHOTRIPSY  12/2014   Social History   Occupational History    Employer: [employer]  Tobacco Use   Smoking status: Former Smoker    Packs/day: 0.50    Years: 7.00    Pack years: 3.50    Types: Cigarettes    Start date: 10/10/2016    Quit date: 04/05/2018    Years since quitting: 0.8   Smokeless tobacco: Never Used   Tobacco comment: patient has patches but has not began yet, has program through her employer  Substance and Sexual Activity   Alcohol use: Yes    Alcohol/week: 0.0 standard drinks    Comment: rare   Drug use: No   Sexual activity: Not on file

## 2019-03-02 ENCOUNTER — Encounter: Payer: Self-pay | Admitting: Orthopaedic Surgery

## 2019-03-11 ENCOUNTER — Other Ambulatory Visit: Payer: Self-pay | Admitting: Nurse Practitioner

## 2019-03-11 ENCOUNTER — Telehealth: Payer: Self-pay | Admitting: Pulmonary Disease

## 2019-03-11 DIAGNOSIS — I471 Supraventricular tachycardia: Secondary | ICD-10-CM

## 2019-03-11 NOTE — Telephone Encounter (Signed)
Spoke with patient. She stated that she needs an order for CPAP supplies to be sent to [facility]. Did advise her that since she had not been seen in a year, she will need to have an OV for insurance to pay for the supplies. She verbalized understanding. She has been scheduled for a televisit tomorrow at 9am. Compliance download has been printed.

## 2019-03-11 NOTE — Progress Notes (Signed)
Virtual Visit via Telephone Note  I connected with [name] [name] on [id number] at  9:00 AM EST by telephone and verified that I am speaking with the correct person using two identifiers.  Location: Patient: Home Provider: [facility] - [id number], [place], [place], [place] [address]   I discussed the limitations, risks, security and privacy concerns of performing an evaluation and management service by telephone and the availability of in person appointments. I also discussed with the patient that there may be a patient responsible charge related to this service. The patient expressed understanding and agreed to proceed.  Patient consented to consult via telephone: Yes People present and their role in pt care: Pt   History of Present Illness:  48 year old female former smoker followed in our office for severe osa   PMH: GERD Smoking History: Former Smoker.  Quit January/February 2020.  5-pack-year smoking history Maintenance:  Pt of Dr. Halford Chessman   Chief complaint: 1 year follow up, OSA   47 year old female former smoker followed in our office for severe obstructive sleep apnea.  Completing 1 year follow-up today.  CPAP compliance report is excellent.  See compliance report listed below:  02/09/2019-03/10/2019-30 out of last 30 days used, all 30 those days greater than 4 hours, average usage 10 hours and 12 minutes, APAP setting 5-20, 95th percentile 12.7, AHI 0.1  Patient reports that she loves her CPAP.  She cannot sleep without it.  She has no current complaints.  Patient is working with weight loss specialist to work on reducing her weight.  [name] also successfully stopped smoking this year.  We have congratulated her for this.  This is great news.    Observations/Objective:  HST 03/02/15 >> AHI 92.2, SaO2 low 47%.  Social History   Tobacco Use  Smoking Status Former Smoker  . Packs/day: 0.50  . Years: 10.00  . Pack years: 5.00  . Types: Cigarettes  . Start  date: 03/12/2008  . Quit date: 04/05/2018  . Years since quitting: 0.9  Smokeless Tobacco Never Used   Immunization History  Administered Date(s) Administered  . Influenza,inj,Quad PF,6+ Mos 12/11/2015  . Influenza-Unspecified 12/14/2014, 12/19/2016, 12/31/2017  . Tdap 10/10/2017      Assessment and Plan:  OSA (obstructive sleep apnea) Plan: Continue CPAP Refill of supplies Follow-up in 1 year  Morbid obesity ([place]) Plan: Continue to work with weight loss specialist Work to increase overall daily physical activity Work to reduce BMI   Follow Up Instructions:  Return in about 1 year (around 03/11/2020), or if symptoms worsen or fail to improve, for Follow up with Dr. Halford Chessman, Follow up with Wyn Quaker FNP-C.   I discussed the assessment and treatment plan with the patient. The patient was provided an opportunity to ask questions and all were answered. The patient agreed with the plan and demonstrated an understanding of the instructions.   The patient was advised to call back or seek an in-person evaluation if the symptoms worsen or if the condition fails to improve as anticipated.  I provided 15 minutes of non-face-to-face time during this encounter.   Lauraine Rinne, NP

## 2019-03-12 ENCOUNTER — Encounter: Payer: Self-pay | Admitting: Pulmonary Disease

## 2019-03-12 ENCOUNTER — Telehealth: Payer: Self-pay | Admitting: Radiology

## 2019-03-12 ENCOUNTER — Ambulatory Visit (INDEPENDENT_AMBULATORY_CARE_PROVIDER_SITE_OTHER): Payer: 59 | Admitting: Orthopaedic Surgery

## 2019-03-12 ENCOUNTER — Ambulatory Visit (INDEPENDENT_AMBULATORY_CARE_PROVIDER_SITE_OTHER): Payer: 59 | Admitting: Pulmonary Disease

## 2019-03-12 ENCOUNTER — Other Ambulatory Visit: Payer: Self-pay

## 2019-03-12 ENCOUNTER — Encounter: Payer: Self-pay | Admitting: Orthopaedic Surgery

## 2019-03-12 DIAGNOSIS — G4733 Obstructive sleep apnea (adult) (pediatric): Secondary | ICD-10-CM | POA: Diagnosis not present

## 2019-03-12 DIAGNOSIS — M75111 Incomplete rotator cuff tear or rupture of right shoulder, not specified as traumatic: Secondary | ICD-10-CM | POA: Diagnosis not present

## 2019-03-12 MED ORDER — NAPROXEN 500 MG PO TABS: 500.0000 mg | ORAL_TABLET | Freq: Every day | ORAL | 0 refills | Status: DC | PRN

## 2019-03-12 NOTE — Assessment & Plan Note (Signed)
Plan: Continue CPAP Refill of supplies Follow-up in 1 year

## 2019-03-12 NOTE — Telephone Encounter (Signed)
OK refill naproxen thanks

## 2019-03-12 NOTE — Patient Instructions (Addendum)
You were seen today by Lauraine Rinne, NP  for:   1. OSA (obstructive sleep apnea)  We recommend that you continue using your CPAP daily >>>Keep up the hard work using your device >>> Goal should be wearing this for the entire night that you are sleeping, at least 4 to 6 hours  Remember:  . Do not drive or operate heavy machinery if tired or drowsy.  . Please notify the supply company and office if you are unable to use your device regularly due to missing supplies or machine being broken.  . Work on maintaining a healthy weight and following your recommended nutrition plan  . Maintain proper daily exercise and movement  . Maintaining proper use of your device can also help improve management of other chronic illnesses such as: Blood pressure, blood sugars, and weight management.   BiPAP/ CPAP Cleaning:  >>>Clean weekly, with Dawn soap, and bottle brush.  Set up to air dry. >>> Wipe mask out daily with wet wipe or towelette    2. Morbid obesity ([place])  Continue to work with specialist Continue to work on increasing overall physical activity   Follow Up:    Return in about 1 year (around 03/11/2020), or if symptoms worsen or fail to improve, for Follow up with Dr. Halford Chessman, Follow up with Wyn Quaker FNP-C.   Please do your part to reduce the spread of COVID-19:      Reduce your risk of any infection  and COVID19 by using the similar precautions used for avoiding the common cold or flu:  Marland Kitchen Wash your hands often with soap and warm water for at least 20 seconds.  If soap and water are not readily available, use an alcohol-based hand sanitizer with at least 60% alcohol.  . If coughing or sneezing, cover your mouth and nose by coughing or sneezing into the elbow areas of your shirt or coat, into a tissue or into your sleeve (not your hands). Langley Gauss A MASK when in public  . Avoid shaking hands with others and consider head nods or verbal greetings only. . Avoid touching your eyes, nose,  or mouth with unwashed hands.  . Avoid close contact with people who are sick. . Avoid places or events with large numbers of people in one location, like concerts or sporting events. . If you have some symptoms but not all symptoms, continue to monitor at home and seek medical attention if your symptoms worsen. . If you are having a medical emergency, call 911.   [place] / e-Visit: [email or web address]         [facility]: [facility]: [id number]                   [facility]: [id number]     It is flu season:   >>> Best ways to protect herself from the flu: Receive the yearly flu vaccine, practice good hand hygiene washing with soap and also using hand sanitizer when available, eat a nutritious meals, get adequate rest, hydrate appropriately   Please contact the office if your symptoms worsen or you have concerns that you are not improving.   Thank you for choosing [facility] for your healthcare, and for allowing [place] to partner with you on your healthcare journey. I am thankful to be able to provide care to you today.   Wyn Quaker FNP-C    Sleep  Apnea Sleep apnea affects breathing during sleep. It causes breathing to stop for a short time or to become shallow. It can also increase the risk of:  Heart attack.  Stroke.  Being very overweight (obese).  Diabetes.  Heart failure.  Irregular heartbeat. The goal of treatment is to help you breathe normally again. What are the causes? There are three kinds of sleep apnea:  Obstructive sleep apnea. This is caused by a blocked or collapsed airway.  Central sleep apnea. This happens when the brain does not send the right signals to the muscles that control breathing.  Mixed sleep apnea. This is a combination of obstructive and central sleep apnea. The most  common cause of this condition is a collapsed or blocked airway. This can happen if:  Your throat muscles are too relaxed.  Your tongue and tonsils are too large.  You are overweight.  Your airway is too small. What increases the risk?  Being overweight.  Smoking.  Having a small airway.  Being older.  Being female.  Drinking alcohol.  Taking medicines to calm yourself (sedatives or tranquilizers).  Having family members with the condition. What are the signs or symptoms?  Trouble staying asleep.  Being sleepy or tired during the day.  Getting angry a lot.  Loud snoring.  Headaches in the morning.  Not being able to focus your mind (concentrate).  Forgetting things.  Less interest in sex.  Mood swings.  Personality changes.  Feelings of sadness (depression).  Waking up a lot during the night to pee (urinate).  Dry mouth.  Sore throat. How is this diagnosed?  Your medical history.  A physical exam.  A test that is done when you are sleeping (sleep study). The test is most often done in a sleep lab but may also be done at home. How is this treated?   Sleeping on your side.  Using a medicine to get rid of mucus in your nose (decongestant).  Avoiding the use of alcohol, medicines to help you relax, or certain pain medicines (narcotics).  Losing weight, if needed.  Changing your diet.  Not smoking.  Using a machine to open your airway while you sleep, such as: ? An oral appliance. This is a mouthpiece that shifts your lower jaw forward. ? A CPAP device. This device blows air through a mask when you breathe out (exhale). ? An EPAP device. This has valves that you put in each nostril. ? A BPAP device. This device blows air through a mask when you breathe in (inhale) and breathe out.  Having surgery if other treatments do not work. It is important to get treatment for sleep apnea. Without treatment, it can lead to:  High blood  pressure.  Coronary artery disease.  In men, not being able to have an erection (impotence).  Reduced thinking ability. Follow these instructions at home: Lifestyle  Make changes that your doctor recommends.  Eat a healthy diet.  Lose weight if needed.  Avoid alcohol, medicines to help you relax, and some pain medicines.  Do not use any products that contain nicotine or tobacco, such as cigarettes, e-cigarettes, and chewing tobacco. If you need help quitting, ask your doctor. General instructions  Take over-the-counter and prescription medicines only as told by your doctor.  If you were given a machine to use while you sleep, use it only as told by your doctor.  If you are having surgery, make sure to tell your doctor you have sleep apnea. You  may need to bring your device with you.  Keep all follow-up visits as told by your doctor. This is important. Contact a doctor if:  The machine that you were given to use during sleep bothers you or does not seem to be working.  You do not get better.  You get worse. Get help right away if:  Your chest hurts.  You have trouble breathing in enough air.  You have an uncomfortable feeling in your back, arms, or stomach.  You have trouble talking.  One side of your body feels weak.  A part of your face is hanging down. These symptoms may be an emergency. Do not wait to see if the symptoms will go away. Get medical help right away. Call your local emergency services (911 in the [place] Do not drive yourself to the hospital. Summary  This condition affects breathing during sleep.  The most common cause is a collapsed or blocked airway.  The goal of treatment is to help you breathe normally while you sleep. This information is not intended to replace advice given to you by your health care provider. Make sure you discuss any questions you have with your health care provider. Document Revised: 12/13/2017 Document Reviewed:  10/22/2017 Elsevier Patient Education  [place].

## 2019-03-12 NOTE — Telephone Encounter (Signed)
Patient forgot to request something for pain at today's appointment. She would like for something to be sent to [facility].  She has run out of her Naproxen.  Please advise.

## 2019-03-12 NOTE — Telephone Encounter (Signed)
I left voicemail for patient advising. 

## 2019-03-12 NOTE — Progress Notes (Signed)
Office Visit Note   Patient: [name] [name]           Date of Birth: 24-Jul-1970           MRN: [id number] Visit Date: 03/12/2019              Requested by: Chevis Pretty, [address] [place],  [place] [id number] PCP: Chevis Pretty, FNP   Assessment & Plan: Visit Diagnoses:  1. Nontraumatic incomplete tear of right rotator cuff     Plan: Patient failed conservative treatment including therapy injection treatment anti-inflammatories activity modification with her 75% supraspinatus tear.  She states she like to proceed with operative intervention due to problems with activities of daily living dressing bathing household activities etc.  We discussed outpatient surgery with the rotator cuff repair.  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  Questions were elicited and answered she understands request to proceed.  Follow-Up Instructions: Preop for shoulder arthroscopy and rotator cuff repair.  Orders:  No orders of the defined types were placed in this encounter.  No orders of the defined types were placed in this encounter.     Procedures: No procedures performed   Clinical Data: No additional findings.   Subjective: Chief Complaint  Patient presents with  . Right Shoulder - Pain, Follow-up    MRI Review    HPI 48 year old female returns with persistent problems with her right shoulder also problems with neck pain and right hand numbness and tingling.  MRI scan previously showed some disc protrusion with caudad extrusion at C6-7 without significant compression.  Due to persistent problems with impingement failed treatment with injection previously and therapy visits over multiple weeks 9 visits all without improvement in her right shoulder symptoms.  [name] taken anti-inflammatories.  MRI scan shows 75% tearing through the supraspinatus tendon with a significant  partial tear which by literature is extremely unlikely to get better and will continue to tear until she has full-thickness tear.  She has had symptoms for greater than 51months without improvement.  [name] continue to work has trouble sleeping has problems washing her hair changing her clothes.  Review of Systems 14 point review of system positive for GE reflux without esophagitis.  History of hidradenitis, migraines.  Obesity, panic attacks.  Rotator cuff significant partial tear right shoulder.  Central disc protrusion C6-7 without cervical compression.   Objective: Vital Signs: BP 131/75   Pulse 82   Ht 5\' 6"  (1.676 m)   Wt 250 lb (113.4 kg)   BMI 40.35 kg/m   Physical Exam Constitutional:      Appearance: [name] well-developed.  HENT:     Head: Normocephalic.     Right Ear: External ear normal.     Left Ear: External ear normal.  Eyes:     Pupils: Pupils are equal, round, and reactive to light.  Neck:     Thyroid: No thyromegaly.     Trachea: No tracheal deviation.  Cardiovascular:     Rate and Rhythm: Normal rate.  Pulmonary:     Effort: Pulmonary effort is normal.  Abdominal:     Palpations: Abdomen is soft.  Skin:    General: Skin is warm and dry.  Neurological:     Mental Status: [name] alert and oriented to person, place, and time.  Psychiatric:        Behavior: Behavior normal.  Ortho Exam patient has positive impingement test positive Neer negative Hawkins.  Long of the biceps is well positioned.  No distal biceps migration.  She can get her arm up overhead with pain.  Pain with resisted supraspinatus testing.  Negative liftoff test negative crossarm adduction test.  She does not have any visible swelling in her hand she has some brachial plexus tenderness present on the right negative on the left.  Specialty Comments:  No specialty comments available.  Imaging: MRI scan performed on 03/02/2019 [place] read by [facility] radiology Dr. Lorriane Shire demonstrates a fifteen 5 x 18 partial-thickness articular surface tear of the distal supraspinatus tendon with moderate glenohumeral joint effusion.  Labrum was intact biceps tendon was normal type I acromion.  Patient has 75% extension through the thickness of the tendon.   PMFS History: Patient Active Problem List   Diagnosis Date Noted  . Nontraumatic incomplete tear of right rotator cuff 03/12/2019  . Impingement syndrome of right shoulder 01/22/2019  . Pain in right wrist 11/18/2018  . Gastroesophageal reflux disease without esophagitis 07/21/2018  . Urinary urgency 07/21/2018  . Hidradenitis suppurativa 10/01/2016  . OSA (obstructive sleep apnea) 05/13/2015  . Morbid obesity ([place]) 05/13/2015  . Lumbago 11/11/2014  . Hypothyroidism 03/27/2014  . Panic attacks 03/27/2014  . SVT (supraventricular tachycardia) ([place]) 06/26/2012  . Migraines 06/26/2012   Past Medical History:  Diagnosis Date  . Allergy    SEASONAL  . Anxiety   . Chronic headaches   . Colon polyps   . GERD (gastroesophageal reflux disease)   . History of kidney stones   . Hyperlipidemia   . Hypothyroidism   . Palpitations   . Sleep apnea     Family History  Problem Relation Age of Onset  . Heart disease Maternal Grandfather   . Heart disease Maternal Grandmother   . Lung cancer Father        died from  . Breast cancer Paternal Grandmother        paternal aunts x2  . Hypertension Mother   . Colon cancer Neg Hx   . Esophageal cancer Neg Hx   . Rectal cancer Neg Hx   . Stomach cancer Neg Hx     Past Surgical History:  Procedure Laterality Date  . CESAREAN SECTION     1194  . COLONOSCOPY    . KNEE ARTHROSCOPY    . LITHOTRIPSY  12/2014   Social History   Occupational History    Employer: [employer]  Tobacco Use  . Smoking status: Former Smoker    Packs/day: 0.50    Years: 10.00    Pack years: 5.00    Types: Cigarettes    Start date: 03/12/2008    Quit date: 04/05/2018     Years since quitting: 0.9  . Smokeless tobacco: Never Used  Substance and Sexual Activity  . Alcohol use: Yes    Alcohol/week: 0.0 standard drinks    Comment: rare  . Drug use: No  . Sexual activity: Not on file

## 2019-03-12 NOTE — Assessment & Plan Note (Signed)
Plan: Continue to work with weight loss specialist Work to increase overall daily physical activity Work to reduce BMI

## 2019-03-12 NOTE — Telephone Encounter (Signed)
Medication sent to pharmacy  

## 2019-03-13 HISTORY — PX: ROTATOR CUFF REPAIR: SHX139

## 2019-03-16 NOTE — Progress Notes (Signed)
Reviewed and agree with assessment/plan.   Phu Record, MD [place] [facility] 03/07/2016, 12:24 PM Pager:  [phone]  

## 2019-04-06 ENCOUNTER — Other Ambulatory Visit: Payer: Self-pay | Admitting: Orthopaedic Surgery

## 2019-04-06 DIAGNOSIS — M75111 Incomplete rotator cuff tear or rupture of right shoulder, not specified as traumatic: Secondary | ICD-10-CM | POA: Diagnosis not present

## 2019-04-06 MED ORDER — OXYCODONE-ACETAMINOPHEN 5-325 MG PO TABS: 1.0000 | ORAL_TABLET | Freq: Four times a day (QID) | ORAL | 0 refills | Status: DC | PRN

## 2019-04-06 NOTE — Progress Notes (Signed)
Percocet post op RC repair ultrafix times one

## 2019-04-08 ENCOUNTER — Other Ambulatory Visit: Payer: Self-pay | Admitting: Nurse Practitioner

## 2019-04-08 DIAGNOSIS — M545 Low back pain, unspecified: Secondary | ICD-10-CM

## 2019-04-08 DIAGNOSIS — G43901 Migraine, unspecified, not intractable, with status migrainosus: Secondary | ICD-10-CM

## 2019-04-08 DIAGNOSIS — K219 Gastro-esophageal reflux disease without esophagitis: Secondary | ICD-10-CM

## 2019-04-14 ENCOUNTER — Telehealth: Payer: Self-pay | Admitting: Orthopaedic Surgery

## 2019-04-14 NOTE — Telephone Encounter (Signed)
refaxed OP note to [place] services as the fax was cut off. [phone]

## 2019-04-16 ENCOUNTER — Ambulatory Visit (INDEPENDENT_AMBULATORY_CARE_PROVIDER_SITE_OTHER): Payer: 59 | Admitting: Orthopaedic Surgery

## 2019-04-16 ENCOUNTER — Encounter: Payer: Self-pay | Admitting: Orthopaedic Surgery

## 2019-04-16 DIAGNOSIS — Z9889 Other specified postprocedural states: Secondary | ICD-10-CM | POA: Insufficient documentation

## 2019-04-16 MED ORDER — TRAMADOL HCL 50 MG PO TABS: 50.0000 mg | ORAL_TABLET | Freq: Four times a day (QID) | ORAL | 0 refills | Status: DC | PRN

## 2019-04-16 NOTE — Progress Notes (Signed)
   Post-Op Visit Note   Patient: [name] [name]           Date of Birth: 1970-04-21           MRN: [id number] Visit Date: 04/16/2019 PCP: Chevis Pretty, FNP   Assessment & Plan: Post shoulder arthroscopy with superior labral debridement with some partial detachment but no full-thickness superior labral tear.  Rotator cuff was repaired with a single anchor.  Sutures removed from the anterior posterior portal today she will do some finger wall walking pain some circles on the floor, some elephant swings.  Work slip given no work x2 weeks recheck 2 weeks.  Chief Complaint:  Chief Complaint  Patient presents with  . Right Shoulder - Routine Post Op    04/06/2019 Right Shoulder Arthroscopy, Debridement Superior Labrum   Visit Diagnoses:  1. S/P right rotator cuff repair     Plan: No work x2 weeks recheck 2 weeks.  Follow-Up Instructions: No follow-ups on file.   Orders:  No orders of the defined types were placed in this encounter.  No orders of the defined types were placed in this encounter.   Imaging: No results found.  PMFS History: Patient Active Problem List   Diagnosis Date Noted  . S/P right rotator cuff repair 04/16/2019  . Nontraumatic incomplete tear of right rotator cuff 03/12/2019  . Impingement syndrome of right shoulder 01/22/2019  . Pain in right wrist 11/18/2018  . Gastroesophageal reflux disease without esophagitis 07/21/2018  . Urinary urgency 07/21/2018  . Hidradenitis suppurativa 10/01/2016  . OSA (obstructive sleep apnea) 05/13/2015  . Morbid obesity ([place]) 05/13/2015  . Lumbago 11/11/2014  . Hypothyroidism 03/27/2014  . Panic attacks 03/27/2014  . SVT (supraventricular tachycardia) ([place]) 06/26/2012  . Migraines 06/26/2012   Past Medical History:  Diagnosis Date  . Allergy    SEASONAL  . Anxiety   . Chronic headaches   . Colon polyps   . GERD (gastroesophageal reflux disease)   . History of kidney stones   . Hyperlipidemia   .  Hypothyroidism   . Palpitations   . Sleep apnea     Family History  Problem Relation Age of Onset  . Heart disease Maternal Grandfather   . Heart disease Maternal Grandmother   . Lung cancer Father        died from  . Breast cancer Paternal Grandmother        paternal aunts x2  . Hypertension Mother   . Colon cancer Neg Hx   . Esophageal cancer Neg Hx   . Rectal cancer Neg Hx   . Stomach cancer Neg Hx     Past Surgical History:  Procedure Laterality Date  . CESAREAN SECTION     1194  . COLONOSCOPY    . KNEE ARTHROSCOPY    . LITHOTRIPSY  12/2014   Social History   Occupational History    Employer: [employer]  Tobacco Use  . Smoking status: Former Smoker    Packs/day: 0.50    Years: 10.00    Pack years: 5.00    Types: Cigarettes    Start date: 03/12/2008    Quit date: 04/05/2018    Years since quitting: 1.0  . Smokeless tobacco: Never Used  Substance and Sexual Activity  . Alcohol use: Yes    Alcohol/week: 0.0 standard drinks    Comment: rare  . Drug use: No  . Sexual activity: Not on file

## 2019-04-21 ENCOUNTER — Other Ambulatory Visit: Payer: Self-pay | Admitting: Nurse Practitioner

## 2019-04-21 DIAGNOSIS — K219 Gastro-esophageal reflux disease without esophagitis: Secondary | ICD-10-CM

## 2019-04-21 NOTE — Telephone Encounter (Signed)
MMM NTBS 30 days given 04/08/19

## 2019-04-21 NOTE — Telephone Encounter (Signed)
Appointment scheduled.

## 2019-04-23 ENCOUNTER — Other Ambulatory Visit: Payer: Self-pay

## 2019-04-23 ENCOUNTER — Ambulatory Visit: Payer: 59 | Admitting: Nurse Practitioner

## 2019-04-23 ENCOUNTER — Encounter: Payer: Self-pay | Admitting: Nurse Practitioner

## 2019-04-23 VITALS — BP 123/79 | HR 75 | Temp 97.5°F | Resp 20 | Ht 66.0 in | Wt 256.0 lb

## 2019-04-23 DIAGNOSIS — K219 Gastro-esophageal reflux disease without esophagitis: Secondary | ICD-10-CM

## 2019-04-23 DIAGNOSIS — F410 Panic disorder [episodic paroxysmal anxiety] without agoraphobia: Secondary | ICD-10-CM

## 2019-04-23 DIAGNOSIS — I471 Supraventricular tachycardia: Secondary | ICD-10-CM | POA: Diagnosis not present

## 2019-04-23 DIAGNOSIS — G4733 Obstructive sleep apnea (adult) (pediatric): Secondary | ICD-10-CM | POA: Diagnosis not present

## 2019-04-23 DIAGNOSIS — G43901 Migraine, unspecified, not intractable, with status migrainosus: Secondary | ICD-10-CM

## 2019-04-23 DIAGNOSIS — M5441 Lumbago with sciatica, right side: Secondary | ICD-10-CM

## 2019-04-23 DIAGNOSIS — N3289 Other specified disorders of bladder: Secondary | ICD-10-CM

## 2019-04-23 DIAGNOSIS — E039 Hypothyroidism, unspecified: Secondary | ICD-10-CM

## 2019-04-23 MED ORDER — CLONAZEPAM 0.5 MG PO TABS: 0.5000 mg | ORAL_TABLET | Freq: Two times a day (BID) | ORAL | 2 refills | Status: DC | PRN

## 2019-04-23 MED ORDER — OXYBUTYNIN CHLORIDE ER 5 MG PO TB24: 5.0000 mg | ORAL_TABLET | Freq: Every day | ORAL | 1 refills | Status: DC

## 2019-04-23 MED ORDER — LEVOTHYROXINE SODIUM 112 MCG PO TABS: 112.0000 ug | ORAL_TABLET | Freq: Every day | ORAL | 1 refills | Status: DC

## 2019-04-23 MED ORDER — METOCLOPRAMIDE HCL 10 MG PO TABS: 10.0000 mg | ORAL_TABLET | Freq: Every day | ORAL | 0 refills | Status: DC

## 2019-04-23 MED ORDER — PANTOPRAZOLE SODIUM 40 MG PO TBEC: 40.0000 mg | DELAYED_RELEASE_TABLET | Freq: Every day | ORAL | 1 refills | Status: DC

## 2019-04-23 MED ORDER — OMEPRAZOLE 40 MG PO CPDR: 40.0000 mg | DELAYED_RELEASE_CAPSULE | Freq: Every day | ORAL | 0 refills | Status: DC

## 2019-04-23 MED ORDER — PROPRANOLOL HCL 80 MG PO TABS: 80.0000 mg | ORAL_TABLET | Freq: Every day | ORAL | 0 refills | Status: DC

## 2019-04-23 NOTE — Patient Instructions (Signed)
Exercising to Stay Healthy To become healthy and stay healthy, it is recommended that you do moderate-intensity and vigorous-intensity exercise. You can tell that you are exercising at a moderate intensity if your heart starts beating faster and you start breathing faster but can still hold a conversation. You can tell that you are exercising at a vigorous intensity if you are breathing much harder and faster and cannot hold a conversation while exercising. Exercising regularly is important. It has many health benefits, such as:  Improving overall fitness, flexibility, and endurance.  Increasing bone density.  Helping with weight control.  Decreasing body fat.  Increasing muscle strength.  Reducing stress and tension.  Improving overall health. How often should I exercise? Choose an activity that you enjoy, and set realistic goals. Your health care provider can help you make an activity plan that works for you. Exercise regularly as told by your health care provider. This may include:  Doing strength training two times a week, such as: ? Lifting weights. ? Using resistance bands. ? Push-ups. ? Sit-ups. ? Yoga.  Doing a certain intensity of exercise for a given amount of time. Choose from these options: ? A total of 150 minutes of moderate-intensity exercise every week. ? A total of 75 minutes of vigorous-intensity exercise every week. ? A mix of moderate-intensity and vigorous-intensity exercise every week. Children, pregnant women, people who have not exercised regularly, people who are overweight, and older adults may need to talk with a health care provider about what activities are safe to do. If you have a medical condition, be sure to talk with your health care provider before you start a new exercise program. What are some exercise ideas? Moderate-intensity exercise ideas include:  Walking 1 mile (1.6 km) in about 15  minutes.  Biking.  Hiking.  Golfing.  Dancing.  Water aerobics. Vigorous-intensity exercise ideas include:  Walking 4.5 miles (7.2 km) or more in about 1 hour.  Jogging or running 5 miles (8 km) in about 1 hour.  Biking 10 miles (16.1 km) or more in about 1 hour.  Lap swimming.  Roller-skating or in-line skating.  Cross-country skiing.  Vigorous competitive sports, such as football, basketball, and soccer.  Jumping rope.  Aerobic dancing. What are some everyday activities that can help me to get exercise?  Yard work, such as: ? Pushing a lawn mower. ? Raking and bagging leaves.  Washing your car.  Pushing a stroller.  Shoveling snow.  Gardening.  Washing windows or floors. How can I be more active in my day-to-day activities?  Use stairs instead of an elevator.  Take a walk during your lunch break.  If you drive, park your car farther away from your work or school.  If you take public transportation, get off one stop early and walk the rest of the way.  Stand up or walk around during all of your indoor phone calls.  Get up, stretch, and walk around every 30 minutes throughout the day.  Enjoy exercise with a friend. Support to continue exercising will help you keep a regular routine of activity. What guidelines can I follow while exercising?  Before you start a new exercise program, talk with your health care provider.  Do not exercise so much that you hurt yourself, feel dizzy, or get very short of breath.  Wear comfortable clothes and wear shoes with good support.  Drink plenty of water while you exercise to prevent dehydration or heat stroke.  Work out until your breathing   and your heartbeat get faster. Where to find more information  U.S. Department of Health and Human Services: www.hhs.gov  Centers for Disease Control and Prevention (CDC): www.cdc.gov Summary  Exercising regularly is important. It will improve your overall fitness,  flexibility, and endurance.  Regular exercise also will improve your overall health. It can help you control your weight, reduce stress, and improve your bone density.  Do not exercise so much that you hurt yourself, feel dizzy, or get very short of breath.  Before you start a new exercise program, talk with your health care provider. This information is not intended to replace advice given to you by your health care provider. Make sure you discuss any questions you have with your health care provider. Document Revised: 02/08/2017 Document Reviewed: 01/17/2017 Elsevier Patient Education  2020 Elsevier Inc.  

## 2019-04-23 NOTE — Progress Notes (Signed)
Subjective:    Patient ID: [name] [name], female    DOB: Aug 14, 1970, 49 y.o.   MRN: [id number]   Chief Complaint: Medical Management of Chronic Issues    HPI:  1. Hypothyroidism, unspecified type No problems that she is aware of. t3 was added to meds Lab Results  Component Value Date   TSH 5.040 (H) 07/21/2018     2. Gastroesophageal reflux disease without esophagitis Is on reglan and omeprazole daily and is doing well.  3. SVT (supraventricular tachycardia) (HCC) Denies any recent episodes of heart racing  4. OSA (obstructive sleep apnea) Wears CPAP nightly. Feels rested on AM  5. Panic attacks No recent attacks  6. Acute midline low back pain with right-sided sciatica Doing well right now. Denies any recent back pain  7. Morbid obesity ([place]) No recent weight changes Wt Readings from Last 3 Encounters:  04/16/19 250 lb (113.4 kg)  03/12/19 250 lb (113.4 kg)  02/19/19 250 lb (113.4 kg)   BMI Readings from Last 3 Encounters:  04/16/19 40.35 kg/m  03/12/19 40.35 kg/m  02/19/19 40.35 kg/m     8. Migraine with status migrainosus, not intractable, unspecified migraine type Is on inderal daily which has helped cut down on migraine frequency    Outpatient Encounter Medications as of 04/23/2019  Medication Sig  . clonazePAM (KLONOPIN) 0.5 MG tablet TAKE 1 TABLET BY MOUTH TWICE DAILY AS NEEDED  . cyclobenzaprine (FLEXERIL) 10 MG tablet Take 2 tablets (20 mg total) by mouth at bedtime.  . fexofenadine (ALLEGRA) 180 MG tablet Take 180 mg by mouth daily.  Marland Kitchen levothyroxine (SYNTHROID) 112 MCG tablet Take 1 tablet (112 mcg total) by mouth daily before breakfast.  . medroxyPROGESTERone (DEPO-PROVERA) 150 MG/ML injection Inject 150 mg into the muscle every 3 (three) months.   . metoCLOPramide (REGLAN) 10 MG tablet Take 1 tablet (10 mg total) by mouth daily. (Needs to be seen before next refill)  . mometasone (NASONEX) 50 MCG/ACT nasal spray INSTILL 2 SPRAYS IN EACH  NOSTRIL EVERY DAY (Patient taking differently: Place 2 sprays into the nose at bedtime. )  . naproxen (NAPROSYN) 500 MG tablet Take 1 tablet (500 mg total) by mouth daily as needed for mild pain.  Marland Kitchen omeprazole (PRILOSEC) 40 MG capsule Take 1 capsule (40 mg total) by mouth daily. (Needs to be seen before next refill)  . ondansetron (ZOFRAN) 8 MG tablet Take 1 tablet (8 mg total) by mouth every 8 (eight) hours as needed for nausea or vomiting.  Marland Kitchen oxybutynin (DITROPAN-XL) 5 MG 24 hr tablet Take 1 tablet (5 mg total) by mouth at bedtime.  Marland Kitchen oxyCODONE-acetaminophen (PERCOCET/ROXICET) 5-325 MG tablet Take 1-2 tablets by mouth every 6 (six) hours as needed for severe pain.  . pantoprazole (PROTONIX) 40 MG tablet Take 40 mg by mouth daily.  . predniSONE (STERAPRED UNI-PAK 21 TAB) 10 MG (21) TBPK tablet As directed x 6 days (Patient not taking: Reported on 04/16/2019)  . propranolol (INDERAL) 80 MG tablet Take 1 tablet (80 mg total) by mouth daily. (Needs to be seen before next refill)  . traMADol (ULTRAM) 50 MG tablet Take 1 tablet (50 mg total) by mouth every 6 (six) hours as needed. Post op pain     Past Surgical History:  Procedure Laterality Date  . CESAREAN SECTION     1194  . COLONOSCOPY    . KNEE ARTHROSCOPY    . LITHOTRIPSY  12/2014    Family History  Problem Relation Age of Onset  .  Heart disease Maternal Grandfather   . Heart disease Maternal Grandmother   . Lung cancer Father        died from  . Breast cancer Paternal Grandmother        paternal aunts x2  . Hypertension Mother   . Colon cancer Neg Hx   . Esophageal cancer Neg Hx   . Rectal cancer Neg Hx   . Stomach cancer Neg Hx     New complaints: Had shoulder surgery in January. [name] currenlty not back to work. She has follow up appointment on  Feb. 18  Social history: She lives with her mom  Controlled substance contract: 04/23/19    Review of Systems  Constitutional: Negative for diaphoresis.  Eyes: Negative for  pain.  Respiratory: Negative for shortness of breath.   Cardiovascular: Negative for chest pain, palpitations and leg swelling.  Gastrointestinal: Negative for abdominal pain.  Endocrine: Negative for polydipsia.  Skin: Negative for rash.  Neurological: Negative for dizziness, weakness and headaches.  Hematological: Does not bruise/bleed easily.  All other systems reviewed and are negative.      Objective:   Physical Exam Vitals and nursing note reviewed.  Constitutional:      General: [name] not in acute distress.    Appearance: Normal appearance. [name] well-developed.  HENT:     Head: Normocephalic.     Nose: Nose normal.  Eyes:     Pupils: Pupils are equal, round, and reactive to light.  Neck:     Vascular: No carotid bruit or JVD.  Cardiovascular:     Rate and Rhythm: Normal rate and regular rhythm.     Heart sounds: Normal heart sounds.  Pulmonary:     Effort: Pulmonary effort is normal. No respiratory distress.     Breath sounds: Normal breath sounds. No wheezing or rales.  Chest:     Chest wall: No tenderness.  Abdominal:     General: Bowel sounds are normal. There is no distension or abdominal bruit.     Palpations: Abdomen is soft. There is no hepatomegaly, splenomegaly, mass or pulsatile mass.     Tenderness: There is no abdominal tenderness.  Musculoskeletal:        General: Normal range of motion.     Cervical back: Normal range of motion and neck supple.     Comments: Did not assess right shoulder due to s/p surgery  Lymphadenopathy:     Cervical: No cervical adenopathy.  Skin:    General: Skin is warm and dry.  Neurological:     Mental Status: [name] alert and oriented to person, place, and time.     Deep Tendon Reflexes: Reflexes are normal and symmetric.  Psychiatric:        Behavior: Behavior normal.        Thought Content: Thought content normal.        Judgment: Judgment normal.    BP 123/79   Pulse 75   Temp (!) 97.5 F (36.4 C) (Temporal)    Resp 20   Ht '5\' 6"'$  (1.676 m)   Wt 256 lb (116.1 kg)   SpO2 97%   BMI 41.32 kg/m         Assessment & Plan:  [name] comes in today with chief complaint of Medical Management of Chronic Issues   Diagnosis and orders addressed:  1. Hypothyroidism, unspecified type - CBC with Differential/Platelet - CMP14+EGFR - Lipid panel - Thyroid Panel With TSH  2. Gastroesophageal reflux disease  without esophagitis Avoid spicy foods Do not eat 2 hours prior to bedtime - metoCLOPramide (REGLAN) 10 MG tablet; Take 1 tablet (10 mg total) by mouth daily. (Needs to be seen before next refill)  Dispense: 3 tablet; Refill: 0 - omeprazole (PRILOSEC) 40 MG capsule; Take 1 capsule (40 mg total) by mouth daily. (Needs to be seen before next refill)  Dispense: 30 capsule; Refill: 0 - pantoprazole (PROTONIX) 40 MG tablet; Take 1 tablet (40 mg total) by mouth daily.  Dispense: 90 tablet; Refill: 1  3. SVT (supraventricular tachycardia) (HCC) Avoid caffeine - clonazePAM (KLONOPIN) 0.5 MG tablet; Take 1 tablet (0.5 mg total) by mouth 2 (two) times daily as needed.  Dispense: 60 tablet; Refill: 2  4. OSA (obstructive sleep apnea) Continue to wear CPAP  5. Panic attacks Stress management  6. Acute midline low back pain with right-sided sciatica  7. Morbid obesity ([place]) Discussed diet and exercise for person with BMI >25 Will recheck weight in 3-6 months  8. Migraine with status migrainosus, not intractable, unspecified migraine type - propranolol (INDERAL) 80 MG tablet; Take 1 tablet (80 mg total) by mouth daily. (Needs to be seen before next refill)  Dispense: 30 tablet; Refill: 0  9. Acquired hypothyroidism - levothyroxine (SYNTHROID) 112 MCG tablet; Take 1 tablet (112 mcg total) by mouth daily before breakfast.  Dispense: 90 tablet; Refill: 1  10. Bladder spasm - oxybutynin (DITROPAN-XL) 5 MG 24 hr tablet; Take 1 tablet (5 mg total) by mouth at bedtime.  Dispense: 90 tablet; Refill:  1   Labs pending Health Maintenance reviewed Diet and exercise encouraged  Follow up plan: 6 months   Mary-Margaret Hassell Done, FNP

## 2019-04-24 LAB — CMP14+EGFR
ALT: 24 IU/L (ref 0–32)
AST: 19 IU/L (ref 0–40)
Albumin/Globulin Ratio: 1.8 (ref 1.2–2.2)
Albumin: 4.3 g/dL (ref 3.8–4.8)
Alkaline Phosphatase: 77 IU/L (ref 39–117)
BUN/Creatinine Ratio: 17 (ref 9–23)
BUN: 13 mg/dL (ref 6–24)
Bilirubin Total: 0.4 mg/dL (ref 0.0–1.2)
CO2: 25 mmol/L (ref 20–29)
Calcium: 9.6 mg/dL (ref 8.7–10.2)
Chloride: 106 mmol/L (ref 96–106)
Creatinine, Ser: 0.78 mg/dL (ref 0.57–1.00)
GFR calc Af Amer: 105 mL/min/{1.73_m2} (ref >59)
GFR calc non Af Amer: 91 mL/min/{1.73_m2} (ref >59)
Globulin, Total: 2.4 g/dL (ref 1.5–4.5)
Glucose: 101 mg/dL — ABNORMAL HIGH (ref 65–99)
Potassium: 4.7 mmol/L (ref 3.5–5.2)
Sodium: 145 mmol/L — ABNORMAL HIGH (ref 134–144)
Total Protein: 6.7 g/dL (ref 6.0–8.5)

## 2019-04-24 LAB — CBC WITH DIFFERENTIAL/PLATELET
Basophils Absolute: 0.1 10*3/uL (ref 0.0–0.2)
Basos: 1 %
EOS (ABSOLUTE): 0.2 10*3/uL (ref 0.0–0.4)
Eos: 2 %
Hematocrit: 44.3 % (ref 34.0–46.6)
Hemoglobin: 14.4 g/dL (ref 11.1–15.9)
Immature Grans (Abs): 0 10*3/uL (ref 0.0–0.1)
Immature Granulocytes: 0 %
Lymphocytes Absolute: 3.3 10*3/uL — ABNORMAL HIGH (ref 0.7–3.1)
Lymphs: 33 %
MCH: 28.9 pg (ref 26.6–33.0)
MCHC: 32.5 g/dL (ref 31.5–35.7)
MCV: 89 fL (ref 79–97)
Monocytes Absolute: 0.7 10*3/uL (ref 0.1–0.9)
Monocytes: 7 %
Neutrophils Absolute: 5.6 10*3/uL (ref 1.4–7.0)
Neutrophils: 57 %
Platelets: 292 10*3/uL (ref 150–450)
RBC: 4.98 x10E6/uL (ref 3.77–5.28)
RDW: 13.6 % (ref 11.7–15.4)
WBC: 10 10*3/uL (ref 3.4–10.8)

## 2019-04-24 LAB — THYROID PANEL WITH TSH
Free Thyroxine Index: 1.7 (ref 1.2–4.9)
T3 Uptake Ratio: 25 % (ref 24–39)
T4, Total: 6.7 ug/dL (ref 4.5–12.0)
TSH: 1.46 u[IU]/mL (ref 0.450–4.500)

## 2019-04-24 LAB — LIPID PANEL
Chol/HDL Ratio: 4.2 ratio (ref 0.0–4.4)
Cholesterol, Total: 142 mg/dL (ref 100–199)
HDL: 34 mg/dL — ABNORMAL LOW (ref >39)
LDL Chol Calc (NIH): 58 mg/dL (ref 0–99)
Triglycerides: 318 mg/dL — ABNORMAL HIGH (ref 0–149)
VLDL Cholesterol Cal: 50 mg/dL — ABNORMAL HIGH (ref 5–40)

## 2019-04-30 ENCOUNTER — Encounter: Payer: Self-pay | Admitting: Orthopaedic Surgery

## 2019-04-30 ENCOUNTER — Ambulatory Visit (INDEPENDENT_AMBULATORY_CARE_PROVIDER_SITE_OTHER): Payer: 59 | Admitting: Orthopaedic Surgery

## 2019-04-30 VITALS — BP 128/92 | HR 73 | Ht 66.0 in | Wt 256.0 lb

## 2019-04-30 DIAGNOSIS — Z9889 Other specified postprocedural states: Secondary | ICD-10-CM

## 2019-04-30 NOTE — Progress Notes (Addendum)
   Post-Op Visit Note   Patient: [name] [name]           Date of Birth: 07/20/1970           MRN: [id number] Visit Date: 04/30/2019 PCP: Chevis Pretty, FNP   Assessment & Plan: Post rotator cuff repair right shoulder single anchor.  [name] able get her hand to the top of her head but still has pain anteriorly.  She had some superior labral debridement.  [name] only 3-1/2 weeks out from surgery.  Work slip given for no work times wks. Resume work on 05/18/19. ROV 5 wks  Chief Complaint:  Chief Complaint  Patient presents with  . Right Shoulder - Follow-up    04/06/2019 Right Shoulder Arthroscopy    Visit Diagnoses:  1. S/P right rotator cuff repair     Plan: RTW as above . ROV 5 wks  Follow-Up Instructions: No follow-ups on file.   Orders:  No orders of the defined types were placed in this encounter.  No orders of the defined types were placed in this encounter.   Imaging: No results found.  PMFS History: Patient Active Problem List   Diagnosis Date Noted  . S/P right rotator cuff repair 04/16/2019  . Nontraumatic incomplete tear of right rotator cuff 03/12/2019  . Impingement syndrome of right shoulder 01/22/2019  . Pain in right wrist 11/18/2018  . Gastroesophageal reflux disease without esophagitis 07/21/2018  . Urinary urgency 07/21/2018  . Hidradenitis suppurativa 10/01/2016  . OSA (obstructive sleep apnea) 05/13/2015  . Morbid obesity ([place]) 05/13/2015  . Lumbago 11/11/2014  . Hypothyroidism 03/27/2014  . Panic attacks 03/27/2014  . SVT (supraventricular tachycardia) ([place]) 06/26/2012  . Migraines 06/26/2012   Past Medical History:  Diagnosis Date  . Allergy    SEASONAL  . Anxiety   . Chronic headaches   . Colon polyps   . GERD (gastroesophageal reflux disease)   . History of kidney stones   . Hyperlipidemia   . Hypothyroidism   . Palpitations   . Sleep apnea     Family History  Problem Relation Age of Onset  . Heart disease Maternal  Grandfather   . Heart disease Maternal Grandmother   . Lung cancer Father        died from  . Breast cancer Paternal Grandmother        paternal aunts x2  . Hypertension Mother   . Colon cancer Neg Hx   . Esophageal cancer Neg Hx   . Rectal cancer Neg Hx   . Stomach cancer Neg Hx     Past Surgical History:  Procedure Laterality Date  . CESAREAN SECTION     1194  . COLONOSCOPY    . KNEE ARTHROSCOPY    . LITHOTRIPSY  12/2014   Social History   Occupational History    Employer: [employer]  Tobacco Use  . Smoking status: Former Smoker    Packs/day: 0.50    Years: 10.00    Pack years: 5.00    Types: Cigarettes    Start date: 03/12/2008    Quit date: 04/05/2018    Years since quitting: 1.0  . Smokeless tobacco: Never Used  Substance and Sexual Activity  . Alcohol use: Yes    Alcohol/week: 0.0 standard drinks    Comment: rare  . Drug use: No  . Sexual activity: Not on file

## 2019-05-01 ENCOUNTER — Telehealth: Payer: Self-pay | Admitting: Nurse Practitioner

## 2019-05-01 DIAGNOSIS — M545 Low back pain, unspecified: Secondary | ICD-10-CM

## 2019-05-01 DIAGNOSIS — K219 Gastro-esophageal reflux disease without esophagitis: Secondary | ICD-10-CM

## 2019-05-01 NOTE — Telephone Encounter (Signed)
metoCLOPramide (REGLAN) 10 MG tablet was only sent in for 3 pills. Patient states she takes this every day needs it sent in for 30day supply. Also the Flexiril was not sent in either and she states she was suppose to have that refilled as well to [facility]. Was you going to fill both these medications at her visit. Please advise.

## 2019-05-04 MED ORDER — CYCLOBENZAPRINE HCL 10 MG PO TABS: 20.0000 mg | ORAL_TABLET | Freq: Every day | ORAL | 1 refills | Status: DC

## 2019-05-04 MED ORDER — METOCLOPRAMIDE HCL 10 MG PO TABS: 10.0000 mg | ORAL_TABLET | Freq: Every day | ORAL | 3 refills | Status: DC

## 2019-05-04 NOTE — Telephone Encounter (Signed)
Patient aware.

## 2019-05-04 NOTE — Telephone Encounter (Signed)
Prescriptions sent. Sorry for the mix up

## 2019-05-19 ENCOUNTER — Telehealth: Payer: Self-pay | Admitting: Nurse Practitioner

## 2019-05-19 DIAGNOSIS — M545 Low back pain, unspecified: Secondary | ICD-10-CM

## 2019-05-19 NOTE — Telephone Encounter (Signed)
Patient states Flexeril directions are 2 at bedtime but only 30 sent in. Is she supposed to take the 2 or just 1 and if 2 can we send enough for a whole month.

## 2019-05-20 NOTE — Telephone Encounter (Signed)
She really should try to decrease down to 1 tablet at night. Is she going to need this permanatly?

## 2019-05-20 NOTE — Telephone Encounter (Signed)
lmtcb

## 2019-05-22 MED ORDER — CYCLOBENZAPRINE HCL 10 MG PO TABS: 20.0000 mg | ORAL_TABLET | Freq: Every day | ORAL | 1 refills | Status: DC

## 2019-05-22 NOTE — Addendum Note (Signed)
Addended by: Chevis Pretty on: 05/22/2019 07:15 PM   Modules accepted: Orders

## 2019-05-22 NOTE — Telephone Encounter (Signed)
Patient says she has been on this for years for her neck pain(taking two at night).  Advised she decrease to one nightly because of causing decrease in mental alertness.  [name] asking for refills.

## 2019-05-28 ENCOUNTER — Telehealth: Payer: Self-pay | Admitting: Nurse Practitioner

## 2019-05-28 DIAGNOSIS — K219 Gastro-esophageal reflux disease without esophagitis: Secondary | ICD-10-CM

## 2019-05-28 DIAGNOSIS — M545 Low back pain, unspecified: Secondary | ICD-10-CM

## 2019-05-28 MED ORDER — CYCLOBENZAPRINE HCL 10 MG PO TABS: 20.0000 mg | ORAL_TABLET | Freq: Every day | ORAL | 1 refills | Status: DC

## 2019-05-28 MED ORDER — PANTOPRAZOLE SODIUM 40 MG PO TBEC: 40.0000 mg | DELAYED_RELEASE_TABLET | Freq: Two times a day (BID) | ORAL | 1 refills | Status: DC

## 2019-05-28 NOTE — Telephone Encounter (Signed)
Please call patient and let her know

## 2019-05-28 NOTE — Telephone Encounter (Signed)
Patient notified via voicemail.

## 2019-05-28 NOTE — Telephone Encounter (Signed)
  Medication Request  05/28/2019  What is the name of the medication? Pt is upset that her meds are messed up. She said mmm did not discuss this with her. She said she was suppose to get 60 protonix pills but only got 30. She said that she didn't get as many reglan or flexerfil pills as she was suppose to   Have you contacted your pharmacy to request a refill? yes  Which pharmacy would you like this sent to? [facility]   Patient notified that their request is being sent to the clinical staff for review and that they should receive a call once it is complete. If they do not receive a call within 24 hours they can check with their pharmacy or our office.

## 2019-05-28 NOTE — Telephone Encounter (Signed)
Fixed protonix rx but insurance may stop paying for it BID. I also increased flexeril to 60 tablets at a time with 1 refill. I cannot do extended refills on flexeril. Just call when run out. Does not mean you have to come in for refill.

## 2019-05-28 NOTE — Telephone Encounter (Signed)
Patient is concerned about a couple of her medications that were refilled.  She reports Protonix was prescribed as taking once a day but patient reports she has been taking twice a day for some time.  She said she has severe GERD and once daily is not enough to control symptoms.  Also, she was given Flexeril #30 with one refill only.  [name] upset because she was not given more refills.  She reports that she used to take two at bedtime but she has cut back and is taking one at bedtime to help with shoulder pain.  The 30 tablets she received will last her a month but she does not want to have to come back every other month for a refill.  She would like new prescriptions sent in for Protonix one bid and a new prescription for Flexeril with more refills.  I told her I would relay this information to you.

## 2019-06-04 ENCOUNTER — Encounter: Payer: Self-pay | Admitting: Orthopaedic Surgery

## 2019-06-04 ENCOUNTER — Ambulatory Visit (INDEPENDENT_AMBULATORY_CARE_PROVIDER_SITE_OTHER): Payer: 59 | Admitting: Orthopaedic Surgery

## 2019-06-04 VITALS — BP 128/70 | HR 87 | Ht 66.0 in | Wt 256.0 lb

## 2019-06-04 DIAGNOSIS — Z9889 Other specified postprocedural states: Secondary | ICD-10-CM

## 2019-06-04 NOTE — Progress Notes (Signed)
   Post-Op Visit Note   Patient: [name] [name]           Date of Birth: [birth year]           MRN: [id number] Visit Date: 06/04/2019 PCP: Chevis Pretty, FNP   Assessment & Plan: Post shoulder superior labral debridement. Rotator cuff repair with single anchor. She states her pain anteriorly over the biceps is so much better she can get her arm up overhead her only problem is internal rotation and she has trouble reaching past her posterior axillary line but getting dressed fixing her hair is fine she has been at work for 3 weeks is happy the surgical result and can return as needed.  Chief Complaint:  Chief Complaint  Patient presents with  . Right Shoulder - Follow-up    04/06/2019 Right Shoulder RCR   Visit Diagnoses:  1. S/P right rotator cuff repair     Plan: Post rotator cuff repair happy with the surgical result good relief of pain. Follow-up as needed.  Follow-Up Instructions: No follow-ups on file.   Orders:  No orders of the defined types were placed in this encounter.  No orders of the defined types were placed in this encounter.   Imaging: No results found.  PMFS History: Patient Active Problem List   Diagnosis Date Noted  . S/P right rotator cuff repair 04/16/2019  . Nontraumatic incomplete tear of right rotator cuff 03/12/2019  . Pain in right wrist 11/18/2018  . Gastroesophageal reflux disease without esophagitis 07/21/2018  . Urinary urgency 07/21/2018  . Hidradenitis suppurativa 10/01/2016  . OSA (obstructive sleep apnea) 05/13/2015  . Morbid obesity ([place]) 05/13/2015  . Lumbago 11/11/2014  . Hypothyroidism 03/27/2014  . Panic attacks 03/27/2014  . SVT (supraventricular tachycardia) ([place]) 06/26/2012  . Migraines 06/26/2012   Past Medical History:  Diagnosis Date  . Allergy    SEASONAL  . Anxiety   . Chronic headaches   . Colon polyps   . GERD (gastroesophageal reflux disease)   . History of kidney stones   . Hyperlipidemia   .  Hypothyroidism   . Palpitations   . Sleep apnea     Family History  Problem Relation Age of Onset  . Heart disease Maternal Grandfather   . Heart disease Maternal Grandmother   . Lung cancer Father        died from  . Breast cancer Paternal Grandmother        paternal aunts x2  . Hypertension Mother   . Colon cancer Neg Hx   . Esophageal cancer Neg Hx   . Rectal cancer Neg Hx   . Stomach cancer Neg Hx     Past Surgical History:  Procedure Laterality Date  . CESAREAN SECTION     1194  . COLONOSCOPY    . KNEE ARTHROSCOPY    . LITHOTRIPSY  12/2014   Social History   Occupational History    Employer: [employer]  Tobacco Use  . Smoking status: Former Smoker    Packs/day: 0.50    Years: 10.00    Pack years: 5.00    Types: Cigarettes    Start date: 03/12/2008    Quit date: 04/05/2018    Years since quitting: 1.1  . Smokeless tobacco: Never Used  Substance and Sexual Activity  . Alcohol use: Yes    Alcohol/week: 0.0 standard drinks    Comment: rare  . Drug use: No  . Sexual activity: Not on file

## 2019-06-16 ENCOUNTER — Other Ambulatory Visit: Payer: Self-pay | Admitting: Nurse Practitioner

## 2019-06-16 DIAGNOSIS — K219 Gastro-esophageal reflux disease without esophagitis: Secondary | ICD-10-CM

## 2019-06-16 DIAGNOSIS — G43901 Migraine, unspecified, not intractable, with status migrainosus: Secondary | ICD-10-CM

## 2019-06-17 ENCOUNTER — Other Ambulatory Visit: Payer: Self-pay | Admitting: *Deleted

## 2019-06-17 MED ORDER — LIOTHYRONINE SODIUM 5 MCG PO TABS: 5.0000 ug | ORAL_TABLET | Freq: Every day | ORAL | 3 refills | Status: DC

## 2019-07-09 ENCOUNTER — Other Ambulatory Visit: Payer: Self-pay | Admitting: Nurse Practitioner

## 2019-07-09 DIAGNOSIS — I471 Supraventricular tachycardia: Secondary | ICD-10-CM

## 2019-08-13 ENCOUNTER — Other Ambulatory Visit: Payer: Self-pay | Admitting: Nurse Practitioner

## 2019-08-13 DIAGNOSIS — K219 Gastro-esophageal reflux disease without esophagitis: Secondary | ICD-10-CM

## 2019-08-13 DIAGNOSIS — M545 Low back pain, unspecified: Secondary | ICD-10-CM

## 2019-08-13 MED ORDER — METOCLOPRAMIDE HCL 10 MG PO TABS: 10.0000 mg | ORAL_TABLET | Freq: Every day | ORAL | 3 refills | Status: DC

## 2019-08-13 MED ORDER — CYCLOBENZAPRINE HCL 10 MG PO TABS: 20.0000 mg | ORAL_TABLET | Freq: Every day | ORAL | 1 refills | Status: DC

## 2019-10-04 ENCOUNTER — Other Ambulatory Visit: Payer: Self-pay | Admitting: Nurse Practitioner

## 2019-10-04 DIAGNOSIS — K219 Gastro-esophageal reflux disease without esophagitis: Secondary | ICD-10-CM

## 2019-10-04 DIAGNOSIS — G43901 Migraine, unspecified, not intractable, with status migrainosus: Secondary | ICD-10-CM

## 2019-10-04 DIAGNOSIS — I471 Supraventricular tachycardia: Secondary | ICD-10-CM

## 2019-10-14 ENCOUNTER — Other Ambulatory Visit: Payer: Self-pay | Admitting: Nurse Practitioner

## 2019-10-14 DIAGNOSIS — M545 Low back pain, unspecified: Secondary | ICD-10-CM

## 2019-10-21 ENCOUNTER — Encounter: Payer: Self-pay | Admitting: Nurse Practitioner

## 2019-10-21 ENCOUNTER — Other Ambulatory Visit: Payer: Self-pay

## 2019-10-21 ENCOUNTER — Ambulatory Visit: Payer: 59 | Admitting: Nurse Practitioner

## 2019-10-21 VITALS — BP 126/69 | HR 63 | Temp 97.3°F | Resp 20 | Ht 66.0 in | Wt 248.0 lb

## 2019-10-21 DIAGNOSIS — R5383 Other fatigue: Secondary | ICD-10-CM

## 2019-10-21 DIAGNOSIS — E039 Hypothyroidism, unspecified: Secondary | ICD-10-CM

## 2019-10-21 DIAGNOSIS — G4733 Obstructive sleep apnea (adult) (pediatric): Secondary | ICD-10-CM

## 2019-10-21 DIAGNOSIS — F410 Panic disorder [episodic paroxysmal anxiety] without agoraphobia: Secondary | ICD-10-CM | POA: Diagnosis not present

## 2019-10-21 DIAGNOSIS — N3289 Other specified disorders of bladder: Secondary | ICD-10-CM

## 2019-10-21 DIAGNOSIS — I471 Supraventricular tachycardia, unspecified: Secondary | ICD-10-CM

## 2019-10-21 DIAGNOSIS — G43901 Migraine, unspecified, not intractable, with status migrainosus: Secondary | ICD-10-CM

## 2019-10-21 DIAGNOSIS — R3915 Urgency of urination: Secondary | ICD-10-CM | POA: Diagnosis not present

## 2019-10-21 DIAGNOSIS — K219 Gastro-esophageal reflux disease without esophagitis: Secondary | ICD-10-CM | POA: Diagnosis not present

## 2019-10-21 MED ORDER — PANTOPRAZOLE SODIUM 40 MG PO TBEC: 40.0000 mg | DELAYED_RELEASE_TABLET | Freq: Two times a day (BID) | ORAL | 1 refills | Status: DC

## 2019-10-21 MED ORDER — OXYBUTYNIN CHLORIDE ER 5 MG PO TB24: 5.0000 mg | ORAL_TABLET | Freq: Every day | ORAL | 1 refills | Status: DC

## 2019-10-21 MED ORDER — CLONAZEPAM 0.5 MG PO TABS: 0.5000 mg | ORAL_TABLET | Freq: Two times a day (BID) | ORAL | 2 refills | Status: DC | PRN

## 2019-10-21 MED ORDER — OMEPRAZOLE 40 MG PO CPDR: DELAYED_RELEASE_CAPSULE | ORAL | 1 refills | Status: DC

## 2019-10-21 MED ORDER — PROPRANOLOL HCL 80 MG PO TABS: 80.0000 mg | ORAL_TABLET | Freq: Every day | ORAL | 1 refills | Status: DC

## 2019-10-21 MED ORDER — LEVOTHYROXINE SODIUM 112 MCG PO TABS: 112.0000 ug | ORAL_TABLET | Freq: Every day | ORAL | 1 refills | Status: DC

## 2019-10-21 MED ORDER — METOCLOPRAMIDE HCL 10 MG PO TABS: 10.0000 mg | ORAL_TABLET | Freq: Every day | ORAL | 3 refills | Status: DC

## 2019-10-21 NOTE — Progress Notes (Signed)
Subjective:    Patient ID: [name] [name], female    DOB: 03-Dec-1970, 49 y.o.   MRN: [id number]   Chief Complaint: Medical Management of Chronic Issues    HPI:  1. Gastroesophageal reflux disease without esophagitis Is on protonix, reglan and omeprazole and still occasionally has symptoms.  2. Acquired hypothyroidism No problems that aware of. [name] on a new diet that requires a lot of soy and she thinks that is making her tired. She says her engery level just bottoms out. Lab Results  Component Value Date   TSH 1.460 04/23/2019     3. Panic attacks Has not had any recently. Is on klonopin bid.    4. Urinary urgency oxybutin dialy and is doing well.  5. OSA (obstructive sleep apnea) wears CPAP nightly. Still not feeling restful in mornings. She thinks it may be coming form flexeril at night that she takes  6. SVT (supraventricular tachycardia) (HCC) Denies any palpitations. No heart racing.  7. Morbid obesity ([place]) Is on [place] and has lost 16lbs. Wt Readings from Last 3 Encounters:  10/21/19 248 lb (112.5 kg)  06/04/19 256 lb (116.1 kg)  04/30/19 256 lb (116.1 kg)   BMI Readings from Last 3 Encounters:  10/21/19 40.03 kg/m  06/04/19 41.32 kg/m  04/30/19 41.32 kg/m       Outpatient Encounter Medications as of 10/21/2019  Medication Sig  . clonazePAM (KLONOPIN) 0.5 MG tablet TAKE 1 TABLET BY MOUTH TWICE DAILY AS NEEDED  . cyclobenzaprine (FLEXERIL) 10 MG tablet TAKE TWO TABLETS BY MOUTH AT BEDTIME  . fexofenadine (ALLEGRA) 180 MG tablet Take 180 mg by mouth daily.  Marland Kitchen levothyroxine (SYNTHROID) 112 MCG tablet Take 1 tablet (112 mcg total) by mouth daily before breakfast.  . liothyronine (CYTOMEL) 5 MCG tablet Take 1 tablet (5 mcg total) by mouth daily.  . medroxyPROGESTERone (DEPO-PROVERA) 150 MG/ML injection Inject 150 mg into the muscle every 3 (three) months.   . metoCLOPramide (REGLAN) 10 MG tablet Take 1 tablet (10 mg total) by mouth daily. (Needs  to be seen before next refill)  . mometasone (NASONEX) 50 MCG/ACT nasal spray INSTILL 2 SPRAYS IN EACH NOSTRIL EVERY DAY (Patient taking differently: Place 2 sprays into the nose at bedtime. )  . naproxen (NAPROSYN) 500 MG tablet Take 1 tablet (500 mg total) by mouth daily as needed for mild pain.  Marland Kitchen omeprazole (PRILOSEC) 40 MG capsule TAKE 1 CAPSULE BY MOUTH EVERY DAY  . ondansetron (ZOFRAN) 8 MG tablet Take 1 tablet (8 mg total) by mouth every 8 (eight) hours as needed for nausea or vomiting.  Marland Kitchen oxybutynin (DITROPAN-XL) 5 MG 24 hr tablet Take 1 tablet (5 mg total) by mouth at bedtime.  . pantoprazole (PROTONIX) 40 MG tablet Take 1 tablet (40 mg total) by mouth 2 (two) times daily.  . propranolol (INDERAL) 80 MG tablet TAKE 1 TABLET BY MOUTH EVERY DAY     Past Surgical History:  Procedure Laterality Date  . CESAREAN SECTION     1194  . COLONOSCOPY    . KNEE ARTHROSCOPY    . LITHOTRIPSY  12/2014    Family History  Problem Relation Age of Onset  . Heart disease Maternal Grandfather   . Heart disease Maternal Grandmother   . Lung cancer Father        died from  . Breast cancer Paternal Grandmother        paternal aunts x2  . Hypertension Mother   . Colon cancer Neg  Hx   . Esophageal cancer Neg Hx   . Rectal cancer Neg Hx   . Stomach cancer Neg Hx     New complaints: None today  Social history: Lives with her son and her mom  Controlled substance contract: n/a    Review of Systems  Constitutional: Positive for fatigue. Negative for diaphoresis.  Eyes: Negative for pain.  Respiratory: Negative for shortness of breath.   Cardiovascular: Negative for chest pain, palpitations and leg swelling.  Gastrointestinal: Negative for abdominal pain.  Endocrine: Negative for polydipsia.  Skin: Negative for rash.  Neurological: Negative for dizziness, weakness and headaches.  Hematological: Does not bruise/bleed easily.  All other systems reviewed and are negative.        Objective:   Physical Exam Vitals and nursing note reviewed.  Constitutional:      General: [name] not in acute distress.    Appearance: Normal appearance. [name] well-developed.  HENT:     Head: Normocephalic.     Nose: Nose normal.  Eyes:     Pupils: Pupils are equal, round, and reactive to light.  Neck:     Vascular: No carotid bruit or JVD.  Cardiovascular:     Rate and Rhythm: Normal rate and regular rhythm.     Heart sounds: Normal heart sounds.  Pulmonary:     Effort: Pulmonary effort is normal. No respiratory distress.     Breath sounds: Normal breath sounds. No wheezing or rales.  Chest:     Chest wall: No tenderness.  Abdominal:     General: Bowel sounds are normal. There is no distension or abdominal bruit.     Palpations: Abdomen is soft. There is no hepatomegaly, splenomegaly, mass or pulsatile mass.     Tenderness: There is no abdominal tenderness.  Musculoskeletal:        General: Normal range of motion.     Cervical back: Normal range of motion and neck supple.  Lymphadenopathy:     Cervical: No cervical adenopathy.  Skin:    General: Skin is warm and dry.  Neurological:     Mental Status: [name] alert and oriented to person, place, and time.     Deep Tendon Reflexes: Reflexes are normal and symmetric.  Psychiatric:        Behavior: Behavior normal.        Thought Content: Thought content normal.        Judgment: Judgment normal.    BP 126/69   Pulse 63   Temp (!) 97.3 F (36.3 C) (Temporal)   Resp 20   Ht '5\' 6"'$  (1.676 m)   Wt 248 lb (112.5 kg)   SpO2 96%   BMI 40.03 kg/m         Assessment & Plan:  [name] [name] comes in today with chief complaint of Medical Management of Chronic Issues   Diagnosis and orders addressed:  1. Gastroesophageal reflux disease without esophagitis Avoid spicy foods Do not eat 2 hours prior to bedtime - CBC with Differential/Platelet - omeprazole (PRILOSEC) 40 MG capsule; TAKE 1 CAPSULE BY MOUTH EVERY DAY   Dispense: 90 capsule; Refill: 1 - metoCLOPramide (REGLAN) 10 MG tablet; Take 1 tablet (10 mg total) by mouth daily. (Needs to be seen before next refill)  Dispense: 30 tablet; Refill: 3 - pantoprazole (PROTONIX) 40 MG tablet; Take 1 tablet (40 mg total) by mouth 2 (two) times daily.  Dispense: 180 tablet; Refill: 1  2. Acquired hypothyroidism Labs pending - CMP14+EGFR - Lipid  panel - Thyroid Panel With TSH - levothyroxine (SYNTHROID) 112 MCG tablet; Take 1 tablet (112 mcg total) by mouth daily before breakfast.  Dispense: 90 tablet; Refill: 1  3. Panic attacks Stress ,management  4. Urinary urgency - oxybutynin (DITROPAN-XL) 5 MG 24 hr tablet; Take 1 tablet (5 mg total) by mouth at bedtime.  Dispense: 90 tablet; Refill: 1  5. OSA (obstructive sleep apnea) Continue CPAP  6. SVT (supraventricular tachycardia) (HCC) Avoid caffeine - clonazePAM (KLONOPIN) 0.5 MG tablet; Take 1 tablet (0.5 mg total) by mouth 2 (two) times daily as needed.  Dispense: 60 tablet; Refill: 2  7. Morbid obesity ([place]) Discussed diet and exercise for person with BMI >25 Will recheck weight in 3-6 months  8. Migraine with status migrainosus, not intractable, unspecified migraine type  - propranolol (INDERAL) 80 MG tablet; Take 1 tablet (80 mg total) by mouth daily.  Dispense: 90 tablet; Refill: 1   Labs pending Health Maintenance reviewed Diet and exercise encouraged  Follow up plan: 6 months   Mary-Margaret Hassell Done, FNP

## 2019-10-21 NOTE — Addendum Note (Signed)
Addended by: Chevis Pretty on: 10/21/2019 12:10 PM   Modules accepted: Orders

## 2019-10-21 NOTE — Patient Instructions (Signed)

## 2019-10-22 LAB — THYROID PANEL WITH TSH
Free Thyroxine Index: 1.4 (ref 1.2–4.9)
T3 Uptake Ratio: 24 % (ref 24–39)
T4, Total: 5.7 ug/dL (ref 4.5–12.0)
TSH: 5.48 u[IU]/mL — ABNORMAL HIGH (ref 0.450–4.500)

## 2019-10-22 LAB — CMP14+EGFR
ALT: 20 IU/L (ref 0–32)
AST: 17 IU/L (ref 0–40)
Albumin/Globulin Ratio: 1.6 (ref 1.2–2.2)
Albumin: 4.5 g/dL (ref 3.8–4.8)
Alkaline Phosphatase: 72 IU/L (ref 48–121)
BUN/Creatinine Ratio: 22 (ref 9–23)
BUN: 17 mg/dL (ref 6–24)
Bilirubin Total: 0.3 mg/dL (ref 0.0–1.2)
CO2: 23 mmol/L (ref 20–29)
Calcium: 9.9 mg/dL (ref 8.7–10.2)
Chloride: 105 mmol/L (ref 96–106)
Creatinine, Ser: 0.78 mg/dL (ref 0.57–1.00)
GFR calc Af Amer: 105 mL/min/{1.73_m2} (ref >59)
GFR calc non Af Amer: 91 mL/min/{1.73_m2} (ref >59)
Globulin, Total: 2.8 g/dL (ref 1.5–4.5)
Glucose: 79 mg/dL (ref 65–99)
Potassium: 4.7 mmol/L (ref 3.5–5.2)
Sodium: 143 mmol/L (ref 134–144)
Total Protein: 7.3 g/dL (ref 6.0–8.5)

## 2019-10-22 LAB — CBC WITH DIFFERENTIAL/PLATELET
Basophils Absolute: 0.1 10*3/uL (ref 0.0–0.2)
Basos: 1 %
EOS (ABSOLUTE): 0.2 10*3/uL (ref 0.0–0.4)
Eos: 3 %
Hematocrit: 46.2 % (ref 34.0–46.6)
Hemoglobin: 14.9 g/dL (ref 11.1–15.9)
Immature Grans (Abs): 0 10*3/uL (ref 0.0–0.1)
Immature Granulocytes: 0 %
Lymphocytes Absolute: 3.7 10*3/uL — ABNORMAL HIGH (ref 0.7–3.1)
Lymphs: 39 %
MCH: 29.2 pg (ref 26.6–33.0)
MCHC: 32.3 g/dL (ref 31.5–35.7)
MCV: 90 fL (ref 79–97)
Monocytes Absolute: 0.7 10*3/uL (ref 0.1–0.9)
Monocytes: 8 %
Neutrophils Absolute: 4.9 10*3/uL (ref 1.4–7.0)
Neutrophils: 49 %
Platelets: 284 10*3/uL (ref 150–450)
RBC: 5.11 x10E6/uL (ref 3.77–5.28)
RDW: 13.5 % (ref 11.7–15.4)
WBC: 9.7 10*3/uL (ref 3.4–10.8)

## 2019-10-22 LAB — LIPID PANEL
Chol/HDL Ratio: 4.1 ratio (ref 0.0–4.4)
Cholesterol, Total: 139 mg/dL (ref 100–199)
HDL: 34 mg/dL — ABNORMAL LOW (ref >39)
LDL Chol Calc (NIH): 75 mg/dL (ref 0–99)
Triglycerides: 175 mg/dL — ABNORMAL HIGH (ref 0–149)
VLDL Cholesterol Cal: 30 mg/dL (ref 5–40)

## 2019-10-22 LAB — VITAMIN B12: Vitamin B-12: 721 pg/mL (ref 232–1245)

## 2019-10-22 LAB — VITAMIN D 25 HYDROXY (VIT D DEFICIENCY, FRACTURES): Vit D, 25-Hydroxy: 41.8 ng/mL (ref 30.0–100.0)

## 2019-10-22 MED ORDER — LEVOTHYROXINE SODIUM 100 MCG PO TABS
100.0000 ug | ORAL_TABLET | Freq: Every day | ORAL | 1 refills | Status: DC
Start: 2019-10-22 — End: 2020-04-05

## 2019-10-22 NOTE — Addendum Note (Signed)
Addended by: Chevis Pretty on: 10/22/2019 01:49 PM   Modules accepted: Orders

## 2019-11-04 ENCOUNTER — Other Ambulatory Visit: Payer: Self-pay | Admitting: Nurse Practitioner

## 2019-11-04 ENCOUNTER — Ambulatory Visit: Payer: 59 | Admitting: Orthopaedic Surgery

## 2019-11-06 IMAGING — DX DG KNEE 1-2V*L*
2 series · 2 of 2 positions shown · non-contrast
Comparison: None.

CLINICAL DATA: Acute left knee pain without known injury.

EXAM:
LEFT KNEE - 1-2 VIEW

[knee ap]
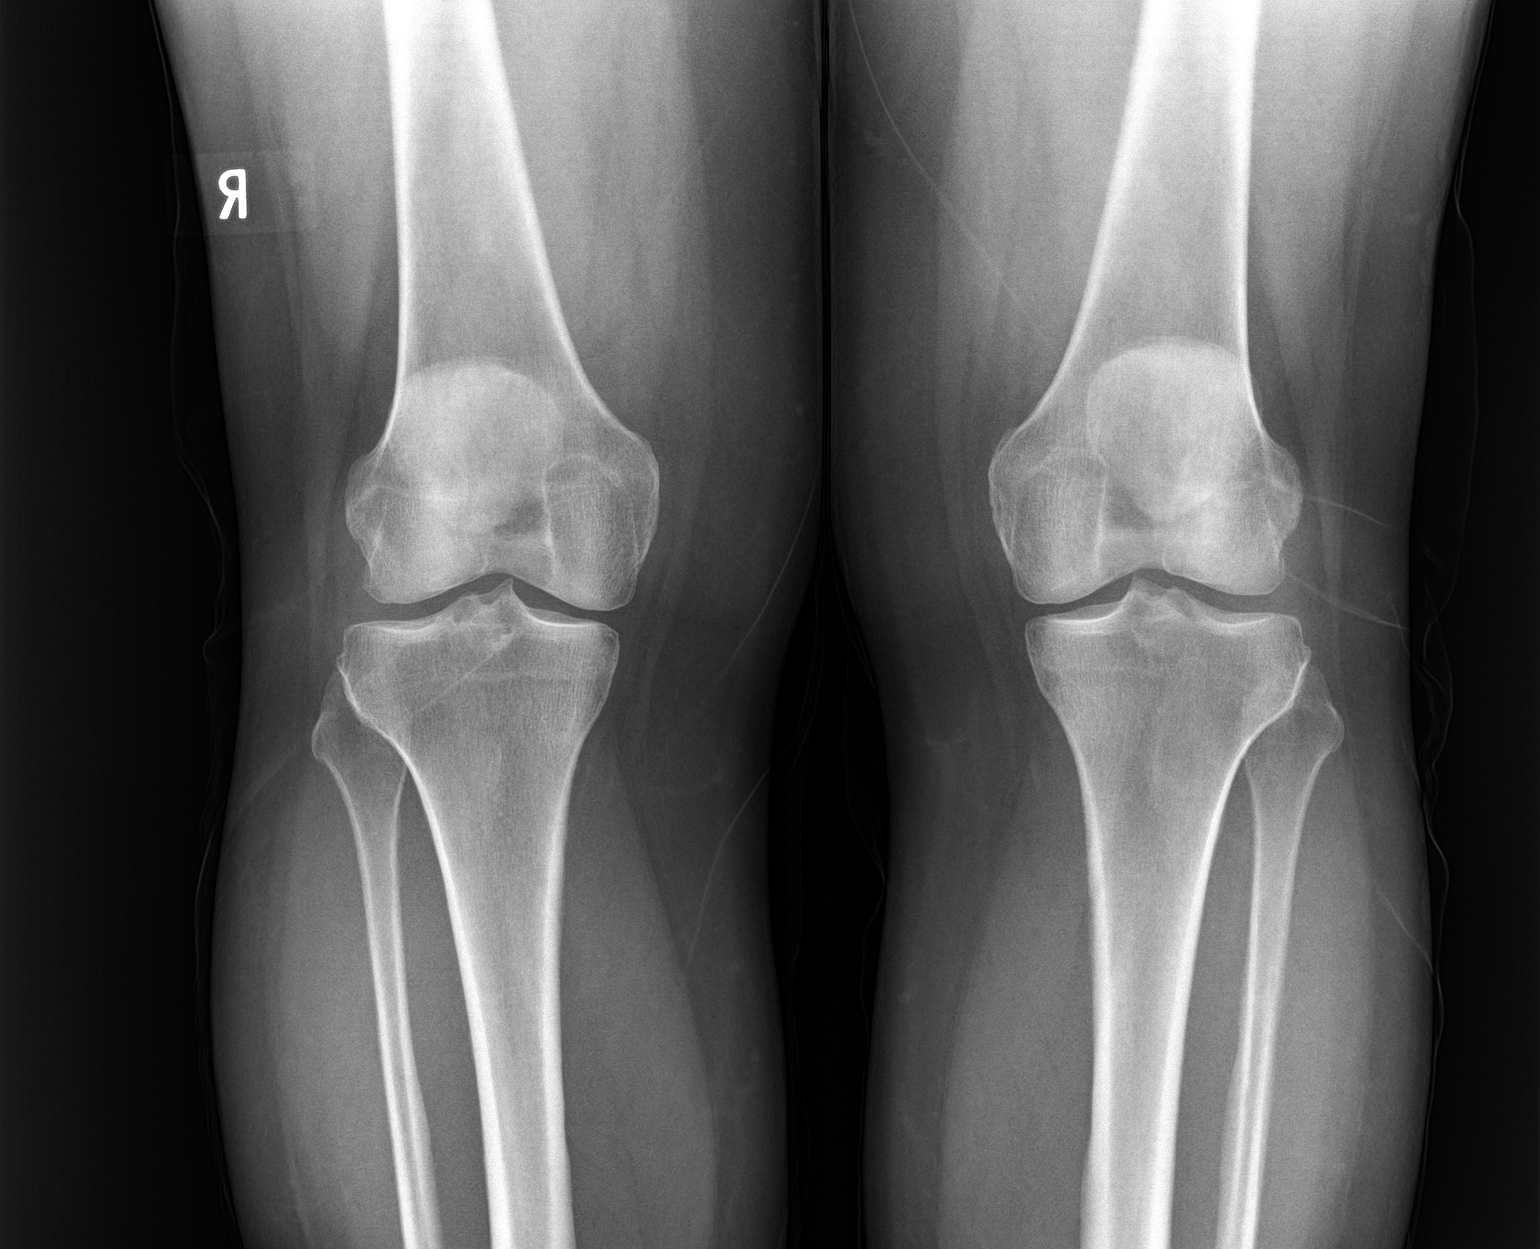

[knee lat]
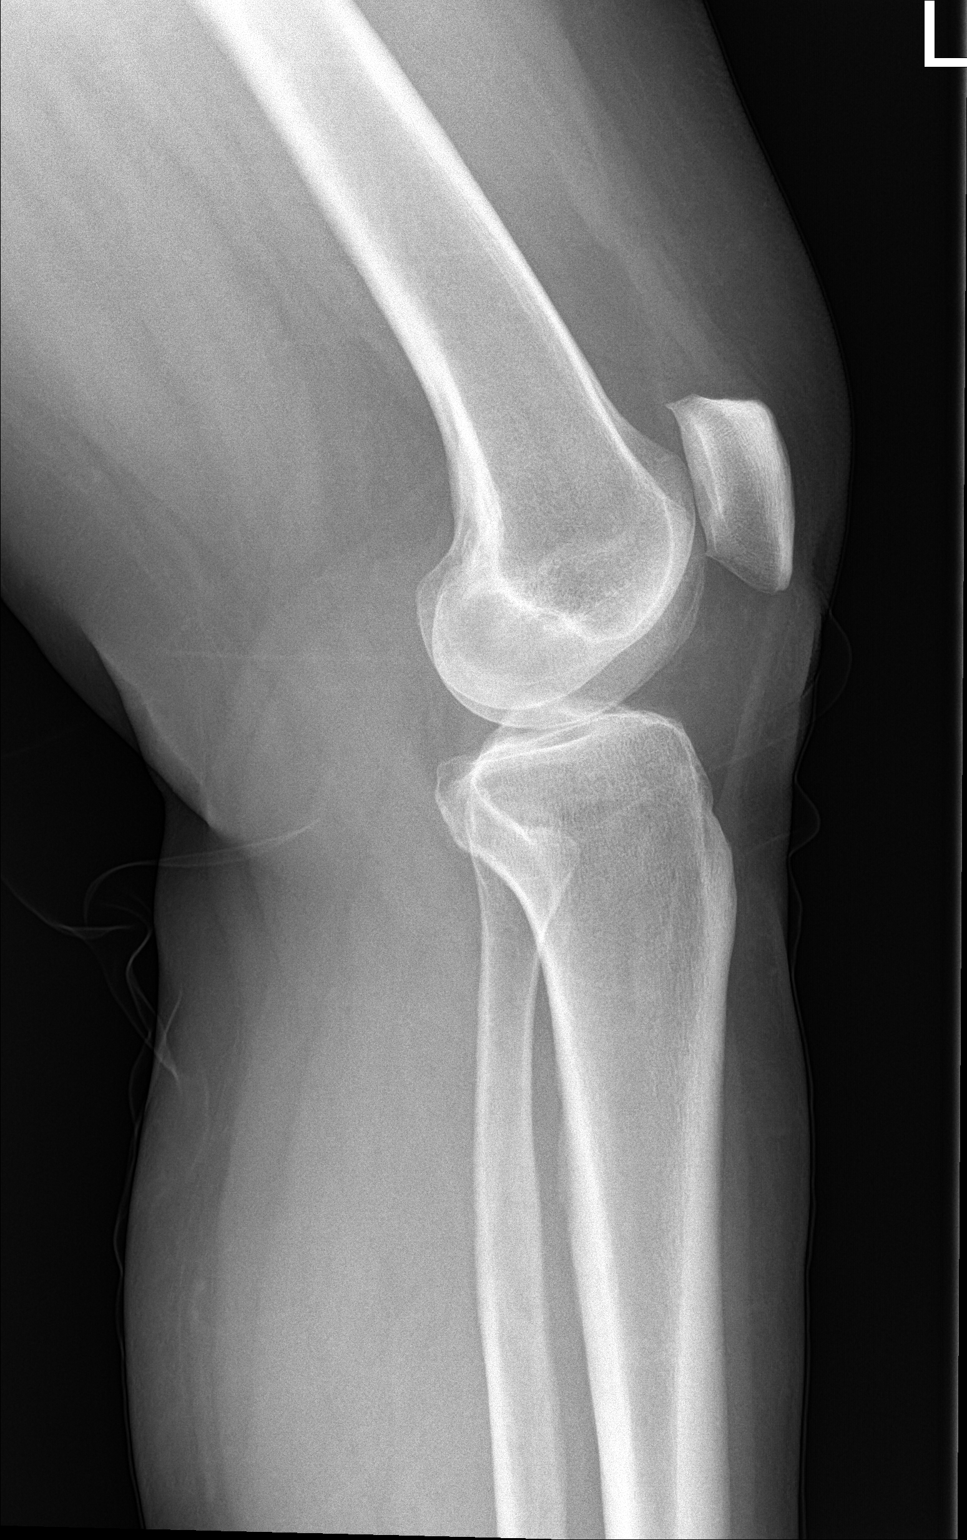

[2 of 2 positions shown; findings below may reference images not displayed]

FINDINGS: No evidence of fracture, dislocation, or joint effusion. No evidence
of arthropathy or other focal bone abnormality. Soft tissues are
unremarkable.
IMPRESSION: Normal left knee.

## 2019-11-18 ENCOUNTER — Ambulatory Visit (INDEPENDENT_AMBULATORY_CARE_PROVIDER_SITE_OTHER): Payer: 59 | Admitting: Orthopaedic Surgery

## 2019-11-18 ENCOUNTER — Encounter: Payer: Self-pay | Admitting: Orthopaedic Surgery

## 2019-11-18 ENCOUNTER — Ambulatory Visit: Payer: Self-pay

## 2019-11-18 ENCOUNTER — Other Ambulatory Visit: Payer: Self-pay

## 2019-11-18 VITALS — Ht 66.0 in | Wt 244.0 lb

## 2019-11-18 DIAGNOSIS — M79605 Pain in left leg: Secondary | ICD-10-CM | POA: Diagnosis not present

## 2019-11-18 DIAGNOSIS — M5442 Lumbago with sciatica, left side: Secondary | ICD-10-CM | POA: Diagnosis not present

## 2019-11-18 NOTE — Progress Notes (Signed)
Office Visit Note   Patient: [name] [name]           Date of Birth: Oct 18, 1970           MRN: [id number] Visit Date: 11/18/2019              Requested by: Chevis Pretty, [address],  [place] [address] PCP: Chevis Pretty, FNP   Assessment & Plan: Visit Diagnoses:  1. Pain in left leg   2. Acute midline low back pain with left-sided sciatica     Plan: Low back pain associated with left buttock and lateral left thigh pain for approximately 2 months.  Occasionally  feels needles in her leg while driving in the car.  She really does not have much trouble sitting but does when she first gets up from a sitting position.  No bowel or bladder problems.  X-rays reveal some minimal degenerative changes.  I think it is worth trying a course of physical therapy and then have her return over the next 4 to 6 weeks or sooner should there be an exacerbation.  At some point I would consider an MRI scan.  Continue with over-the-counter medicines as needed  Follow-Up Instructions: Return if symptoms worsen or fail to improve.   Orders:  Orders Placed This Encounter  Procedures  . XR Lumbar Spine 2-3 Views  . Ambulatory referral to Physical Therapy   No orders of the defined types were placed in this encounter.     Procedures: No procedures performed   Clinical Data: No additional findings.   Subjective: Chief Complaint  Patient presents with  . Left Leg - Pain, Weakness  Patient presents today for left leg pain. She states that it started to hurt two months ago. No known injury. Her pain is located down the lateral side of her leg.  Stairs make it worse.  Her left leg is weak and sometimes she has to pick it up to put her leg into the car. No groin pain. She states that it felt like "needles" in her leg while riding in the car today. She does have lower back pain with prolonged standing. She takes Aleve if needed. Relates having some discomfort when she gets  up from a sitting position but not so much of a problem when she sits for long periods of time.  No trouble sleeping.  Not having any bowel or bladder discomfort.  No pain referable to her right lower extremity  HPI  Review of Systems   Objective: Vital Signs: Ht 5\' 6"  (1.676 m)   Wt 244 lb (110.7 kg)   BMI 39.38 kg/m   Physical Exam Constitutional:      Appearance: [name] well-developed.  Eyes:     Pupils: Pupils are equal, round, and reactive to light.  Pulmonary:     Effort: Pulmonary effort is normal.  Skin:    General: Skin is warm and dry.  Neurological:     Mental Status: [name] alert and oriented to person, place, and time.  Psychiatric:        Behavior: Behavior normal.     Ortho Exam awake alert and comfortable in no acute distress.  Straight leg raise negative.  Reflexes appear to be symmetrical motor and sensory exam intact.  Painless range of motion of both hips and both knees.  No percussible tenderness of the lumbar spine or her left paralumbar region.  No pain over either SI joint.  No discomfort along  the greater trochanter of the left hip or the IT band. Specialty Comments:  No specialty comments available.  Imaging: XR Lumbar Spine 2-3 Views  Result Date: 11/18/2019 Films lumbar spine were obtained in 2 projections.  There is a very minimal right-sided scoliosis.  Abundant bowel gas obliterates some of the detail.  SI joints appear to be intact.  No listhesis.  Disc spaces appear to be well-maintained.  Mild facet sclerosis at L4-5 and L5-S1.  No history of injury or trauma but mild compression of the superior endplate of I62 which does not appear to be symptomatic    PMFS History: Patient Active Problem List   Diagnosis Date Noted  . S/P right rotator cuff repair 04/16/2019  . Pain in right wrist 11/18/2018  . Gastroesophageal reflux disease without esophagitis 07/21/2018  . Urinary urgency 07/21/2018  . Hidradenitis suppurativa 10/01/2016  . OSA  (obstructive sleep apnea) 05/13/2015  . Morbid obesity ([place]) 05/13/2015  . Low back pain 11/11/2014  . Hypothyroidism 03/27/2014  . Panic attacks 03/27/2014  . SVT (supraventricular tachycardia) ([place]) 06/26/2012  . Migraines 06/26/2012   Past Medical History:  Diagnosis Date  . Allergy    SEASONAL  . Anxiety   . Chronic headaches   . Colon polyps   . GERD (gastroesophageal reflux disease)   . History of kidney stones   . Hyperlipidemia   . Hypothyroidism   . Palpitations   . Sleep apnea     Family History  Problem Relation Age of Onset  . Heart disease Maternal Grandfather   . Heart disease Maternal Grandmother   . Lung cancer Father        died from  . Breast cancer Paternal Grandmother        paternal aunts x2  . Hypertension Mother   . Colon cancer Neg Hx   . Esophageal cancer Neg Hx   . Rectal cancer Neg Hx   . Stomach cancer Neg Hx     Past Surgical History:  Procedure Laterality Date  . CESAREAN SECTION     1194  . COLONOSCOPY    . KNEE ARTHROSCOPY    . LITHOTRIPSY  12/2014   Social History   Occupational History    Employer: [employer]  Tobacco Use  . Smoking status: Former Smoker    Packs/day: 0.50    Years: 10.00    Pack years: 5.00    Types: Cigarettes    Start date: 03/12/2008    Quit date: 04/05/2018    Years since quitting: 1.6  . Smokeless tobacco: Never Used  Vaping Use  . Vaping Use: Never used  Substance and Sexual Activity  . Alcohol use: Yes    Alcohol/week: 0.0 standard drinks    Comment: rare  . Drug use: No  . Sexual activity: Not on file

## 2019-12-07 ENCOUNTER — Other Ambulatory Visit: Payer: Self-pay | Admitting: Nurse Practitioner

## 2019-12-07 DIAGNOSIS — K219 Gastro-esophageal reflux disease without esophagitis: Secondary | ICD-10-CM

## 2020-01-04 ENCOUNTER — Other Ambulatory Visit: Payer: Self-pay | Admitting: Nurse Practitioner

## 2020-01-04 DIAGNOSIS — M545 Low back pain, unspecified: Secondary | ICD-10-CM

## 2020-02-01 ENCOUNTER — Other Ambulatory Visit: Payer: Self-pay

## 2020-02-01 ENCOUNTER — Ambulatory Visit: Payer: 59 | Admitting: Pulmonary Disease

## 2020-02-01 ENCOUNTER — Encounter: Payer: Self-pay | Admitting: Pulmonary Disease

## 2020-02-01 VITALS — BP 98/62 | HR 71 | Temp 97.2°F | Ht 66.0 in | Wt 254.6 lb

## 2020-02-01 DIAGNOSIS — Z0000 Encounter for general adult medical examination without abnormal findings: Secondary | ICD-10-CM | POA: Diagnosis not present

## 2020-02-01 DIAGNOSIS — G4733 Obstructive sleep apnea (adult) (pediatric): Secondary | ICD-10-CM

## 2020-02-01 DIAGNOSIS — J310 Chronic rhinitis: Secondary | ICD-10-CM

## 2020-02-01 MED ORDER — AZELASTINE HCL 0.1 % NA SOLN: 1.0000 | Freq: Two times a day (BID) | NASAL | 12 refills | Status: DC

## 2020-02-01 NOTE — Assessment & Plan Note (Signed)
Up-to-date with COVID-19 vaccinations Up-to-date with flu

## 2020-02-01 NOTE — Patient Instructions (Addendum)
You were seen today by Lauraine Rinne, NP  for:   1. OSA (obstructive sleep apnea)  We recommend that you continue using your CPAP daily >>>Keep up the hard work using your device >>> Goal should be wearing this for the entire night that you are sleeping, at least 4 to 6 hours  Remember:  . Do not drive or operate heavy machinery if tired or drowsy.  . Please notify the supply company and office if you are unable to use your device regularly due to missing supplies or machine being broken.  . Work on maintaining a healthy weight and following your recommended nutrition plan  . Maintain proper daily exercise and movement  . Maintaining proper use of your device can also help improve management of other chronic illnesses such as: Blood pressure, blood sugars, and weight management.   BiPAP/ CPAP Cleaning:  >>>Clean weekly, with Dawn soap, and bottle brush.  Set up to air dry. >>> Wipe mask out daily with wet wipe or towelette   2. Chronic rhinitis  Okay to continue Nasonex nasal spray  Can also start taking Astelin nasal spray  Consider starting nasal saline rinses twice daily Use distilled water Shake well Get bottle lukewarm like a baby bottle Use before nasal meds   3. Morbid obesity ([place])  Continue to work with primary care on working to reduce your BMI  4. Healthcare maintenance  Great job being up-to-date with your flu vaccinations and your COVID-19 vaccinations   We recommend today:   Meds ordered this encounter  Medications  . azelastine (ASTELIN) 0.1 % nasal spray    Sig: Place 1 spray into both nostrils 2 (two) times daily. Use in each nostril as directed    Dispense:  30 mL    Refill:  12    Follow Up:    Return in about 1 year (around 01/31/2021), or if symptoms worsen or fail to improve, for Follow up with Wyn Quaker FNP-C, Follow up with Dr. Halford Chessman.   Notification of test results are managed in the following manner: If there are  any recommendations or  changes to the  plan of care discussed in office today,  we will contact you and let you know what they are. If you do not hear from [place], then your results are normal and you can view them through your  MyChart account , or a letter will be sent to you. Thank you again for trusting [place] with your care  - Thank you, [facility]    It is flu season:   >>> Best ways to protect herself from the flu: Receive the yearly flu vaccine, practice good hand hygiene washing with soap and also using hand sanitizer when available, eat a nutritious meals, get adequate rest, hydrate appropriately       Please contact the office if your symptoms worsen or you have concerns that you are not improving.   Thank you for choosing [facility] for your healthcare, and for allowing [place] to partner with you on your healthcare journey. I am thankful to be able to provide care to you today.   Wyn Quaker FNP-C

## 2020-02-01 NOTE — Progress Notes (Signed)
@Patient  ID: [name] [name], female    DOB: [birth year], 48 y.o.   MRN: [id number]  Chief Complaint  Patient presents with  . Follow-up    Wears CPAP avg. 8hrs.Doing good    Referring provider: Hassell Done, Mary-Margaret, *  HPI:  49 year old female former smoker followed in our office for severe osa   PMH: GERD Smoking History: Former Smoker.  Quit January/February 2020.  5-pack-year smoking history Maintenance: None  Pt of Dr. Halford Chessman   02/01/2020  - Visit   49 year old female former smoker followed in our office for severe obstructive sleep apnea.  Home sleep study in 2016 showed an AHI of 92.2.  Patient presenting today as a 1 year follow-up.  She continues to utilize her CPAP every night.  She has excellent compliance.  She reports that she cannot sleep without it.  Compliance report is listed below:  12/30/2019-01/28/2020-CPAP compliance report-30 had a last 30 days use, all 30 those days greater than 4 hours, average usage 9 hours and 41 minutes, APAP setting 5-20, 95th percentile 12.4, AHI 0.2  Patient works for [employer].  She reports that she is vaccinated COVID-19 including booster.  [name] also received her seasonal flu vaccine.   Tests:   HST 03/02/15 >> AHI 92.2, SaO2 low 47%.  FENO:  No results found for: NITRICOXIDE  PFT: No flowsheet data found.  WALK:  No flowsheet data found.  Imaging: No results found.  Lab Results:  CBC    Component Value Date/Time   WBC 9.7 10/21/2019 1210   WBC 11.2 (H) 01/07/2019 1407   RBC 5.11 10/21/2019 1210   RBC 5.22 (H) 01/07/2019 1407   HGB 14.9 10/21/2019 1210   HCT 46.2 10/21/2019 1210   PLT 284 10/21/2019 1210   MCV 90 10/21/2019 1210   MCH 29.2 10/21/2019 1210   MCH 28.4 01/07/2019 1407   MCHC 32.3 10/21/2019 1210   MCHC 32.2 01/07/2019 1407   RDW 13.5 10/21/2019 1210   LYMPHSABS 3.7 (H) 10/21/2019 1210   MONOABS 0.9 12/05/2011 1937   EOSABS 0.2 10/21/2019 1210   BASOSABS 0.1 10/21/2019 1210     BMET    Component Value Date/Time   NA 143 10/21/2019 1210   K 4.7 10/21/2019 1210   CL 105 10/21/2019 1210   CO2 23 10/21/2019 1210   GLUCOSE 79 10/21/2019 1210   GLUCOSE 95 01/07/2019 1407   BUN 17 10/21/2019 1210   CREATININE 0.78 10/21/2019 1210   CALCIUM 9.9 10/21/2019 1210   GFRNONAA 91 10/21/2019 1210   GFRAA 105 10/21/2019 1210    BNP No results found for: BNP  ProBNP No results found for: PROBNP  Specialty Problems      Pulmonary Problems   OSA (obstructive sleep apnea)    HST 03/02/15 >> AHI 92.2, SaO2 low 47%.       Chronic rhinitis      No Known Allergies  Immunization History  Administered Date(s) Administered  . Influenza Inj Mdck Quad Pf 12/11/2018  . Influenza Split 12/14/2014, 12/02/2019  . Influenza,inj,Quad PF,6+ Mos 12/11/2015  . Influenza-Unspecified 12/14/2014, 12/19/2016, 12/31/2017  . Moderna SARS-COVID-2 Vaccination 05/20/2019, 06/17/2019, 01/07/2020  . Pneumococcal Polysaccharide-23 09/17/2009  . Tdap 08/24/1997, 10/10/2017    Past Medical History:  Diagnosis Date  . Allergy    SEASONAL  . Anxiety   . Chronic headaches   . Colon polyps   . GERD (gastroesophageal reflux disease)   . History of kidney stones   . Hyperlipidemia   .  Hypothyroidism   . Palpitations   . Sleep apnea     Tobacco History: Social History   Tobacco Use  Smoking Status Former Smoker  . Packs/day: 0.50  . Years: 10.00  . Pack years: 5.00  . Types: Cigarettes  . Start date: 03/12/2008  . Quit date: 04/05/2018  . Years since quitting: 1.8  Smokeless Tobacco Never Used   Counseling given: Not Answered   Continue to not smoke  Outpatient Encounter Medications as of 02/01/2020  Medication Sig  . clonazePAM (KLONOPIN) 0.5 MG tablet Take 1 tablet (0.5 mg total) by mouth 2 (two) times daily as needed.  . cyclobenzaprine (FLEXERIL) 10 MG tablet TAKE TWO TABLETS BY MOUTH AT BEDTIME  . fexofenadine (ALLEGRA) 180 MG tablet Take 180 mg by mouth  daily.  Marland Kitchen levothyroxine (SYNTHROID) 100 MCG tablet Take 1 tablet (100 mcg total) by mouth daily.  Marland Kitchen liothyronine (CYTOMEL) 5 MCG tablet TAKE 1 TABLET BY MOUTH DAILY  . medroxyPROGESTERone (DEPO-PROVERA) 150 MG/ML injection Inject 150 mg into the muscle every 3 (three) months.   . metoCLOPramide (REGLAN) 10 MG tablet TAKE 1 TABLET BY MOUTH EVERY DAY  . mometasone (NASONEX) 50 MCG/ACT nasal spray INSTILL 2 SPRAYS IN EACH NOSTRIL EVERY DAY  . naproxen (NAPROSYN) 500 MG tablet Take 1 tablet (500 mg total) by mouth daily as needed for mild pain.  Marland Kitchen omeprazole (PRILOSEC) 40 MG capsule TAKE 1 CAPSULE BY MOUTH EVERY DAY  . ondansetron (ZOFRAN) 8 MG tablet Take 1 tablet (8 mg total) by mouth every 8 (eight) hours as needed for nausea or vomiting.  Marland Kitchen oxybutynin (DITROPAN-XL) 5 MG 24 hr tablet Take 1 tablet (5 mg total) by mouth at bedtime.  . pantoprazole (PROTONIX) 40 MG tablet Take 1 tablet (40 mg total) by mouth 2 (two) times daily.  . propranolol (INDERAL) 80 MG tablet Take 1 tablet (80 mg total) by mouth daily.  Marland Kitchen azelastine (ASTELIN) 0.1 % nasal spray Place 1 spray into both nostrils 2 (two) times daily. Use in each nostril as directed   No facility-administered encounter medications on file as of 02/01/2020.     Review of Systems  Review of Systems  Constitutional: Negative for activity change, fatigue and fever.  HENT: Negative for sinus pressure, sinus pain and sore throat.   Respiratory: Negative for cough, shortness of breath and wheezing.   Cardiovascular: Negative for chest pain and palpitations.  Gastrointestinal: Negative for diarrhea, nausea and vomiting.  Musculoskeletal: Negative for arthralgias.  Neurological: Negative for dizziness.  Psychiatric/Behavioral: Negative for sleep disturbance. The patient is not nervous/anxious.      Physical Exam  BP 98/62 (BP Location: Left Arm, Cuff Size: Large)   Pulse 71   Temp (!) 97.2 F (36.2 C) (Temporal)   Ht 5\' 6"  (1.676 m)   Wt  254 lb 9.6 oz (115.5 kg)   SpO2 98%   BMI 41.09 kg/m   Wt Readings from Last 5 Encounters:  02/01/20 254 lb 9.6 oz (115.5 kg)  11/18/19 244 lb (110.7 kg)  10/21/19 248 lb (112.5 kg)  06/04/19 256 lb (116.1 kg)  04/30/19 256 lb (116.1 kg)    BMI Readings from Last 5 Encounters:  02/01/20 41.09 kg/m  11/18/19 39.38 kg/m  10/21/19 40.03 kg/m  06/04/19 41.32 kg/m  04/30/19 41.32 kg/m     Physical Exam Vitals and nursing note reviewed.  Constitutional:      General: [name] not in acute distress.    Appearance: Normal appearance. [name]  obese.  HENT:     Head: Normocephalic and atraumatic.     Right Ear: External ear normal.     Left Ear: External ear normal.     Mouth/Throat:     Mouth: Mucous membranes are moist.     Pharynx: Oropharynx is clear.     Comments: MP 3 Eyes:     Pupils: Pupils are equal, round, and reactive to light.  Cardiovascular:     Rate and Rhythm: Normal rate and regular rhythm.     Pulses: Normal pulses.     Heart sounds: Normal heart sounds. No murmur heard.   Pulmonary:     Effort: Pulmonary effort is normal. No respiratory distress.     Breath sounds: Normal breath sounds. No decreased air movement. No decreased breath sounds, wheezing or rales.  Musculoskeletal:     Cervical back: Normal range of motion.  Skin:    General: Skin is warm and dry.     Capillary Refill: Capillary refill takes less than 2 seconds.  Neurological:     General: No focal deficit present.     Mental Status: [name] alert and oriented to person, place, and time. Mental status is at baseline.     Gait: Gait normal.  Psychiatric:        Mood and Affect: Mood normal.        Behavior: Behavior normal.        Thought Content: Thought content normal.        Judgment: Judgment normal.       Assessment & Plan:   Chronic rhinitis Patient with known chronic rhinitis Patient also exhibiting symptoms of vasomotor rhinitis that she reports she is more symptoms  after eating  Plan: Start Astelin nasal spray Continue Nasonex Can also start nasal saline rinses prior to nasal medications  OSA (obstructive sleep apnea) Excellent compliance Well-controlled AHI  Plan: Continue CPAP therapy Refill supplies Follow-up in 1 year  Healthcare maintenance Up-to-date with COVID-19 vaccinations Up-to-date with flu  Morbid obesity ([employer]) Continue to work with primary care on working to manage and improve your BMI Reducing your BMI can help with management of your obstructive sleep apnea    Return in about 1 year (around 01/31/2021), or if symptoms worsen or fail to improve, for Follow up with Wyn Quaker FNP-C, Follow up with Dr. Halford Chessman.   Lauraine Rinne, NP 02/01/2020   This appointment required 25 minutes of patient care (this includes precharting, chart review, review of results, face-to-face care, etc.).

## 2020-02-01 NOTE — Assessment & Plan Note (Signed)
Excellent compliance Well-controlled AHI  Plan: Continue CPAP therapy Refill supplies Follow-up in 1 year

## 2020-02-01 NOTE — Assessment & Plan Note (Signed)
Continue to work with primary care on working to manage and improve your BMI Reducing your BMI can help with management of your obstructive sleep apnea

## 2020-02-01 NOTE — Assessment & Plan Note (Signed)
Patient with known chronic rhinitis Patient also exhibiting symptoms of vasomotor rhinitis that she reports she is more symptoms after eating  Plan: Start Astelin nasal spray Continue Nasonex Can also start nasal saline rinses prior to nasal medications

## 2020-02-03 NOTE — Progress Notes (Signed)
Reviewed and agree with assessment/plan.   Chesley Mires, MD [facility] 02/03/2020, 7:25 PM Pager:  [phone]

## 2020-02-05 ENCOUNTER — Other Ambulatory Visit: Payer: Self-pay | Admitting: Nurse Practitioner

## 2020-02-05 DIAGNOSIS — I471 Supraventricular tachycardia: Secondary | ICD-10-CM

## 2020-03-04 ENCOUNTER — Other Ambulatory Visit: Payer: Self-pay | Admitting: Nurse Practitioner

## 2020-03-10 ENCOUNTER — Ambulatory Visit: Payer: 59 | Admitting: Pulmonary Disease

## 2020-03-14 ENCOUNTER — Other Ambulatory Visit: Payer: Self-pay | Admitting: Nurse Practitioner

## 2020-03-14 DIAGNOSIS — M545 Low back pain, unspecified: Secondary | ICD-10-CM

## 2020-04-05 ENCOUNTER — Other Ambulatory Visit: Payer: Self-pay | Admitting: Nurse Practitioner

## 2020-04-05 DIAGNOSIS — K219 Gastro-esophageal reflux disease without esophagitis: Secondary | ICD-10-CM

## 2020-04-20 LAB — HM PAP SMEAR: HM Pap smear: NORMAL

## 2020-04-22 ENCOUNTER — Ambulatory Visit: Payer: 59 | Admitting: Nurse Practitioner

## 2020-04-22 ENCOUNTER — Other Ambulatory Visit: Payer: Self-pay

## 2020-04-22 ENCOUNTER — Encounter: Payer: Self-pay | Admitting: Nurse Practitioner

## 2020-04-22 VITALS — BP 113/73 | HR 66 | Temp 97.6°F | Resp 20 | Ht 66.0 in | Wt 260.0 lb

## 2020-04-22 DIAGNOSIS — K219 Gastro-esophageal reflux disease without esophagitis: Secondary | ICD-10-CM

## 2020-04-22 DIAGNOSIS — I471 Supraventricular tachycardia: Secondary | ICD-10-CM | POA: Diagnosis not present

## 2020-04-22 DIAGNOSIS — N3289 Other specified disorders of bladder: Secondary | ICD-10-CM

## 2020-04-22 DIAGNOSIS — F410 Panic disorder [episodic paroxysmal anxiety] without agoraphobia: Secondary | ICD-10-CM

## 2020-04-22 DIAGNOSIS — R3915 Urgency of urination: Secondary | ICD-10-CM

## 2020-04-22 DIAGNOSIS — G43901 Migraine, unspecified, not intractable, with status migrainosus: Secondary | ICD-10-CM

## 2020-04-22 DIAGNOSIS — G4733 Obstructive sleep apnea (adult) (pediatric): Secondary | ICD-10-CM

## 2020-04-22 DIAGNOSIS — E039 Hypothyroidism, unspecified: Secondary | ICD-10-CM

## 2020-04-22 MED ORDER — CLONAZEPAM 0.5 MG PO TABS: 0.5000 mg | ORAL_TABLET | Freq: Two times a day (BID) | ORAL | 2 refills | Status: DC | PRN

## 2020-04-22 MED ORDER — PROPRANOLOL HCL 80 MG PO TABS: 80.0000 mg | ORAL_TABLET | Freq: Every day | ORAL | 1 refills | Status: DC

## 2020-04-22 MED ORDER — OMEPRAZOLE 40 MG PO CPDR: DELAYED_RELEASE_CAPSULE | ORAL | 1 refills | Status: DC

## 2020-04-22 MED ORDER — METOCLOPRAMIDE HCL 10 MG PO TABS: 10.0000 mg | ORAL_TABLET | Freq: Every day | ORAL | 1 refills | Status: DC

## 2020-04-22 MED ORDER — PANTOPRAZOLE SODIUM 40 MG PO TBEC: 40.0000 mg | DELAYED_RELEASE_TABLET | Freq: Two times a day (BID) | ORAL | 1 refills | Status: DC

## 2020-04-22 MED ORDER — OXYBUTYNIN CHLORIDE ER 5 MG PO TB24: 5.0000 mg | ORAL_TABLET | Freq: Every day | ORAL | 1 refills | Status: DC

## 2020-04-22 NOTE — Patient Instructions (Signed)
Calorie Counting for Weight Loss Calories are units of energy. Your body needs a certain number of calories from food to keep going throughout the day. When you eat or drink more calories than your body needs, your body stores the extra calories mostly as fat. When you eat or drink fewer calories than your body needs, your body burns fat to get the energy it needs. Calorie counting means keeping track of how many calories you eat and drink each day. Calorie counting can be helpful if you need to lose weight. If you eat fewer calories than your body needs, you should lose weight. Ask your health care provider what a healthy weight is for you. For calorie counting to work, you will need to eat the right number of calories each day to lose a healthy amount of weight per week. A dietitian can help you figure out how many calories you need in a day and will suggest ways to reach your calorie goal.  A healthy amount of weight to lose each week is usually 1-2 lb (0.5-0.9 kg). This usually means that your daily calorie intake should be reduced by 500-750 calories.  Eating 1,200-1,500 calories a day can help most women lose weight.  Eating 1,500-1,800 calories a day can help most men lose weight. What do I need to know about calorie counting? Work with your health care provider or dietitian to determine how many calories you should get each day. To meet your daily calorie goal, you will need to:  Find out how many calories are in each food that you would like to eat. Try to do this before you eat.  Decide how much of the food you plan to eat.  Keep a food log. Do this by writing down what you ate and how many calories it had. To successfully lose weight, it is important to balance calorie counting with a healthy lifestyle that includes regular activity. Where do I find calorie information? The number of calories in a food can be found on a Nutrition Facts label. If a food does not have a Nutrition Facts  label, try to look up the calories online or ask your dietitian for help. Remember that calories are listed per serving. If you choose to have more than one serving of a food, you will have to multiply the calories per serving by the number of servings you plan to eat. For example, the label on a package of bread might say that a serving size is 1 slice and that there are 90 calories in a serving. If you eat 1 slice, you will have eaten 90 calories. If you eat 2 slices, you will have eaten 180 calories.   How do I keep a food log? After each time that you eat, record the following in your food log as soon as possible:  What you ate. Be sure to include toppings, sauces, and other extras on the food.  How much you ate. This can be measured in cups, ounces, or number of items.  How many calories were in each food and drink.  The total number of calories in the food you ate. Keep your food log near you, such as in a pocket-sized notebook or on an app or website on your mobile phone. Some programs will calculate calories for you and show you how many calories you have left to meet your daily goal. What are some portion-control tips?  Know how many calories are in a serving. This will   help you know how many servings you can have of a certain food.  Use a measuring cup to measure serving sizes. You could also try weighing out portions on a kitchen scale. With time, you will be able to estimate serving sizes for some foods.  Take time to put servings of different foods on your favorite plates or in your favorite bowls and cups so you know what a serving looks like.  Try not to eat straight from a food's packaging, such as from a bag or box. Eating straight from the package makes it hard to see how much you are eating and can lead to overeating. Put the amount you would like to eat in a cup or on a plate to make sure you are eating the right portion.  Use smaller plates, glasses, and bowls for smaller  portions and to prevent overeating.  Try not to multitask. For example, avoid watching TV or using your computer while eating. If it is time to eat, sit down at a table and enjoy your food. This will help you recognize when you are full. It will also help you be more mindful of what and how much you are eating. What are tips for following this plan? Reading food labels  Check the calorie count compared with the serving size. The serving size may be smaller than what you are used to eating.  Check the source of the calories. Try to choose foods that are high in protein, fiber, and vitamins, and low in saturated fat, trans fat, and sodium. Shopping  Read nutrition labels while you shop. This will help you make healthy decisions about which foods to buy.  Pay attention to nutrition labels for low-fat or fat-free foods. These foods sometimes have the same number of calories or more calories than the full-fat versions. They also often have added sugar, starch, or salt to make up for flavor that was removed with the fat.  Make a grocery list of lower-calorie foods and stick to it. Cooking  Try to cook your favorite foods in a healthier way. For example, try baking instead of frying.  Use low-fat dairy products. Meal planning  Use more fruits and vegetables. One-half of your plate should be fruits and vegetables.  Include lean proteins, such as chicken, turkey, and fish. Lifestyle Each week, aim to do one of the following:  150 minutes of moderate exercise, such as walking.  75 minutes of vigorous exercise, such as running. General information  Know how many calories are in the foods you eat most often. This will help you calculate calorie counts faster.  Find a way of tracking calories that works for you. Get creative. Try different apps or programs if writing down calories does not work for you. What foods should I eat?  Eat nutritious foods. It is better to have a nutritious,  high-calorie food, such as an avocado, than a food with few nutrients, such as a bag of potato chips.  Use your calories on foods and drinks that will fill you up and will not leave you hungry soon after eating. ? Examples of foods that fill you up are nuts and nut butters, vegetables, lean proteins, and high-fiber foods such as whole grains. High-fiber foods are foods with more than 5 g of fiber per serving.  Pay attention to calories in drinks. Low-calorie drinks include water and unsweetened drinks. The items listed above may not be a complete list of foods and beverages you can eat.   Contact a dietitian for more information.   What foods should I limit? Limit foods or drinks that are not good sources of vitamins, minerals, or protein or that are high in unhealthy fats. These include:  Candy.  Other sweets.  Sodas, specialty coffee drinks, alcohol, and juice. The items listed above may not be a complete list of foods and beverages you should avoid. Contact a dietitian for more information. How do I count calories when eating out?  Pay attention to portions. Often, portions are much larger when eating out. Try these tips to keep portions smaller: ? Consider sharing a meal instead of getting your own. ? If you get your own meal, eat only half of it. Before you start eating, ask for a container and put half of your meal into it. ? When available, consider ordering smaller portions from the menu instead of full portions.  Pay attention to your food and drink choices. Knowing the way food is cooked and what is included with the meal can help you eat fewer calories. ? If calories are listed on the menu, choose the lower-calorie options. ? Choose dishes that include vegetables, fruits, whole grains, low-fat dairy products, and lean proteins. ? Choose items that are boiled, broiled, grilled, or steamed. Avoid items that are buttered, battered, fried, or served with cream sauce. Items labeled as  crispy are usually fried, unless stated otherwise. ? Choose water, low-fat milk, unsweetened iced tea, or other drinks without added sugar. If you want an alcoholic beverage, choose a lower-calorie option, such as a glass of wine or light beer. ? Ask for dressings, sauces, and syrups on the side. These are usually high in calories, so you should limit the amount you eat. ? If you want a salad, choose a garden salad and ask for grilled meats. Avoid extra toppings such as bacon, cheese, or fried items. Ask for the dressing on the side, or ask for olive oil and vinegar or lemon to use as dressing.  Estimate how many servings of a food you are given. Knowing serving sizes will help you be aware of how much food you are eating at restaurants. Where to find more information  Centers for Disease Control and Prevention: www.cdc.gov  [place] Department of Agriculture: myplate.gov Summary  Calorie counting means keeping track of how many calories you eat and drink each day. If you eat fewer calories than your body needs, you should lose weight.  A healthy amount of weight to lose per week is usually 1-2 lb (0.5-0.9 kg). This usually means reducing your daily calorie intake by 500-750 calories.  The number of calories in a food can be found on a Nutrition Facts label. If a food does not have a Nutrition Facts label, try to look up the calories online or ask your dietitian for help.  Use smaller plates, glasses, and bowls for smaller portions and to prevent overeating.  Use your calories on foods and drinks that will fill you up and not leave you hungry shortly after a meal. This information is not intended to replace advice given to you by your health care provider. Make sure you discuss any questions you have with your health care provider. Document Revised: 04/09/2019 Document Reviewed: 04/09/2019 Elsevier Patient Education  2021 [employer]  

## 2020-04-22 NOTE — Progress Notes (Signed)
Subjective:    Patient ID: [name] [name], female    DOB: [birth year], 49 y.o.   MRN: [id number]   Chief Complaint: Medical Management of Chronic Issues    HPI:  1. Migraine with status migrainosus, not intractable, unspecified migraine type Is on inderal for preventative. hasnot had any migraines in almost a year.  2. SVT (supraventricular tachycardia) ([place]) Has an occasional episode but  ony last a few minutes.  3. OSA (obstructive sleep apnea) Wears CPAP nightly. She feels much  More rested in mornings  4. Gastroesophageal reflux disease without esophagitis Is on protonix and omeprazole daily which is working well for her  5. Acquired hypothyroidism No problems that she is aware of. [name] on cytomel and synthroid Lab Results  Component Value Date   TSH 5.480 (H) 10/21/2019     6. Panic attacks Is on klonopin BID. Says she will have attacks if she does not take.  7. Urinary urgency Takes ditropan daily which helps with urgency.  8. Morbid obesity ([place]) Is back on optiva diet  Wt Readings from Last 3 Encounters:  04/22/20 260 lb (117.9 kg)  02/01/20 254 lb 9.6 oz (115.5 kg)  11/18/19 244 lb (110.7 kg)   BMI Readings from Last 3 Encounters:  04/22/20 41.97 kg/m  02/01/20 41.09 kg/m  11/18/19 39.38 kg/m       Outpatient Encounter Medications as of 04/22/2020  Medication Sig  . clonazePAM (KLONOPIN) 0.5 MG tablet TAKE 1 TABLET BY MOUTH TWICE DAILY AS NEEDED  . azelastine (ASTELIN) 0.1 % nasal spray Place 1 spray into both nostrils 2 (two) times daily. Use in each nostril as directed  . cyclobenzaprine (FLEXERIL) 10 MG tablet TAKE TWO TABLETS BY MOUTH AT BEDTIME  . fexofenadine (ALLEGRA) 180 MG tablet Take 180 mg by mouth daily.  Marland Kitchen levothyroxine (SYNTHROID) 100 MCG tablet TAKE 1 TABLET BY MOUTH EVERY DAY  . liothyronine (CYTOMEL) 5 MCG tablet TAKE 1 TABLET BY MOUTH DAILY  . medroxyPROGESTERone (DEPO-PROVERA) 150 MG/ML injection Inject 150 mg into the muscle  every 3 (three) months.   . metoCLOPramide (REGLAN) 10 MG tablet TAKE 1 TABLET BY MOUTH EVERY DAY  . mometasone (NASONEX) 50 MCG/ACT nasal spray INSTILL 2 SPRAYS IN EACH NOSTRIL EVERY DAY  . naproxen (NAPROSYN) 500 MG tablet Take 1 tablet (500 mg total) by mouth daily as needed for mild pain.  Marland Kitchen omeprazole (PRILOSEC) 40 MG capsule TAKE 1 CAPSULE BY MOUTH EVERY DAY  . ondansetron (ZOFRAN) 8 MG tablet Take 1 tablet (8 mg total) by mouth every 8 (eight) hours as needed for nausea or vomiting.  Marland Kitchen oxybutynin (DITROPAN-XL) 5 MG 24 hr tablet Take 1 tablet (5 mg total) by mouth at bedtime.  . pantoprazole (PROTONIX) 40 MG tablet Take 1 tablet (40 mg total) by mouth 2 (two) times daily.  . propranolol (INDERAL) 80 MG tablet Take 1 tablet (80 mg total) by mouth daily.       Past Surgical History:  Procedure Laterality Date  . CESAREAN SECTION     1194  . COLONOSCOPY    . KNEE ARTHROSCOPY    . LITHOTRIPSY  12/2014    Family History  Problem Relation Age of Onset  . Heart disease Maternal Grandfather   . Heart disease Maternal Grandmother   . Lung cancer Father        died from  . Breast cancer Paternal Grandmother        paternal aunts x2  . Hypertension Mother   .  Colon cancer Neg Hx   . Esophageal cancer Neg Hx   . Rectal cancer Neg Hx   . Stomach cancer Neg Hx     New complaints: None today  Social history: Lives with her mom  Controlled substance contract: n/a    Review of Systems  Constitutional: Negative for diaphoresis.  Eyes: Negative for pain.  Respiratory: Negative for shortness of breath.   Cardiovascular: Negative for chest pain, palpitations and leg swelling.  Gastrointestinal: Negative for abdominal pain.  Endocrine: Negative for polydipsia.  Skin: Negative for rash.  Neurological: Negative for dizziness, weakness and headaches.  Hematological: Does not bruise/bleed easily.  All other systems reviewed and are negative.      Objective:   Physical  Exam Vitals and nursing note reviewed.  Constitutional:      General: [name] not in acute distress.    Appearance: Normal appearance. [name] well-developed and well-nourished.  HENT:     Head: Normocephalic.     Nose: Nose normal.     Mouth/Throat:     Mouth: Oropharynx is clear and moist.  Eyes:     Extraocular Movements: EOM normal.     Pupils: Pupils are equal, round, and reactive to light.  Neck:     Vascular: No carotid bruit or JVD.  Cardiovascular:     Rate and Rhythm: Normal rate and regular rhythm.     Pulses: Intact distal pulses.     Heart sounds: Normal heart sounds.  Pulmonary:     Effort: Pulmonary effort is normal. No respiratory distress.     Breath sounds: Normal breath sounds. No wheezing or rales.  Chest:     Chest wall: No tenderness.  Abdominal:     General: Bowel sounds are normal. There is no distension or abdominal bruit. Aorta is normal.     Palpations: Abdomen is soft. There is no hepatomegaly, splenomegaly, mass or pulsatile mass.     Tenderness: There is no abdominal tenderness.  Musculoskeletal:        General: No edema. Normal range of motion.     Cervical back: Normal range of motion and neck supple.  Lymphadenopathy:     Cervical: No cervical adenopathy.  Skin:    General: Skin is warm and dry.  Neurological:     Mental Status: [name] alert and oriented to person, place, and time.     Deep Tendon Reflexes: Reflexes are normal and symmetric.  Psychiatric:        Mood and Affect: Mood and affect normal.        Behavior: Behavior normal.        Thought Content: Thought content normal.        Judgment: Judgment normal.     BP 113/73   Pulse 66   Temp 97.6 F (36.4 C) (Temporal)   Resp 20   Ht 5\' 6"  (1.676 m)   Wt 260 lb (117.9 kg)   SpO2 92%   BMI 41.97 kg/m       Assessment & Plan:  [name] [name] comes in today with chief complaint of Medical Management of Chronic Issues   Diagnosis and orders addressed:  1. Migraine with  status migrainosus, not intractable, unspecified migraine type Avoid caffeine - propranolol (INDERAL) 80 MG tablet; Take 1 tablet (80 mg total) by mouth daily.  Dispense: 90 tablet; Refill: 1  2. SVT (supraventricular tachycardia) ([facility]) - clonazePAM (KLONOPIN) 0.5 MG tablet; Take 1 tablet (0.5 mg total) by mouth 2 (two) times  daily as needed.  Dispense: 60 tablet; Refill: 2  3. OSA (obstructive sleep apnea) Continue CPAP  4. Gastroesophageal reflux disease without esophagitis Avoid spicy foods Do not eat 2 hours prior to bedtime - metoCLOPramide (REGLAN) 10 MG tablet; Take 1 tablet (10 mg total) by mouth daily.  Dispense: 90 tablet; Refill: 1 - pantoprazole (PROTONIX) 40 MG tablet; Take 1 tablet (40 mg total) by mouth 2 (two) times daily.  Dispense: 180 tablet; Refill: 1 - omeprazole (PRILOSEC) 40 MG capsule; TAKE 1 CAPSULE BY MOUTH EVERY DAY  Dispense: 90 capsule; Refill: 1  5. Acquired hypothyroidism Labs pending  6. Panic attacks Stress management  7. Urinary urgency Continue detrol LA  8. Morbid obesity ([place]) Discussed diet and exercise for person with BMI >25 Will recheck weight in 3-6 months Discussed saxenda  9. Bladder spasm - oxybutynin (DITROPAN-XL) 5 MG 24 hr tablet; Take 1 tablet (5 mg total) by mouth at bedtime.  Dispense: 90 tablet; Refill: 1   Labs pending Health Maintenance reviewed Diet and exercise encouraged  Follow up plan: 6 months   Mary-Margaret Hassell Done, FNP

## 2020-04-22 NOTE — Addendum Note (Signed)
Addended by: Chevis Pretty on: 04/22/2020 11:56 AM   Modules accepted: Orders

## 2020-04-23 LAB — CMP14+EGFR
ALT: 20 IU/L (ref 0–32)
AST: 20 IU/L (ref 0–40)
Albumin/Globulin Ratio: 1.7 (ref 1.2–2.2)
Albumin: 4.7 g/dL (ref 3.8–4.8)
Alkaline Phosphatase: 80 IU/L (ref 44–121)
BUN/Creatinine Ratio: 16 (ref 9–23)
BUN: 14 mg/dL (ref 6–24)
Bilirubin Total: 0.2 mg/dL (ref 0.0–1.2)
CO2: 21 mmol/L (ref 20–29)
Calcium: 9.9 mg/dL (ref 8.7–10.2)
Chloride: 106 mmol/L (ref 96–106)
Creatinine, Ser: 0.88 mg/dL (ref 0.57–1.00)
GFR calc Af Amer: 90 mL/min/{1.73_m2} (ref >59)
GFR calc non Af Amer: 78 mL/min/{1.73_m2} (ref >59)
Globulin, Total: 2.7 g/dL (ref 1.5–4.5)
Glucose: 84 mg/dL (ref 65–99)
Potassium: 4.7 mmol/L (ref 3.5–5.2)
Sodium: 143 mmol/L (ref 134–144)
Total Protein: 7.4 g/dL (ref 6.0–8.5)

## 2020-04-23 LAB — CBC WITH DIFFERENTIAL/PLATELET
Basophils Absolute: 0.1 10*3/uL (ref 0.0–0.2)
Basos: 1 %
EOS (ABSOLUTE): 0.2 10*3/uL (ref 0.0–0.4)
Eos: 2 %
Hematocrit: 45.8 % (ref 34.0–46.6)
Hemoglobin: 15.1 g/dL (ref 11.1–15.9)
Immature Grans (Abs): 0 10*3/uL (ref 0.0–0.1)
Immature Granulocytes: 0 %
Lymphocytes Absolute: 3.9 10*3/uL — ABNORMAL HIGH (ref 0.7–3.1)
Lymphs: 39 %
MCH: 29.4 pg (ref 26.6–33.0)
MCHC: 33 g/dL (ref 31.5–35.7)
MCV: 89 fL (ref 79–97)
Monocytes Absolute: 0.9 10*3/uL (ref 0.1–0.9)
Monocytes: 9 %
Neutrophils Absolute: 4.8 10*3/uL (ref 1.4–7.0)
Neutrophils: 49 %
Platelets: 290 10*3/uL (ref 150–450)
RBC: 5.13 x10E6/uL (ref 3.77–5.28)
RDW: 13.6 % (ref 11.7–15.4)
WBC: 9.9 10*3/uL (ref 3.4–10.8)

## 2020-04-23 LAB — LIPID PANEL
Chol/HDL Ratio: 3.8 ratio (ref 0.0–4.4)
Cholesterol, Total: 150 mg/dL (ref 100–199)
HDL: 39 mg/dL — ABNORMAL LOW (ref >39)
LDL Chol Calc (NIH): 80 mg/dL (ref 0–99)
Triglycerides: 179 mg/dL — ABNORMAL HIGH (ref 0–149)
VLDL Cholesterol Cal: 31 mg/dL (ref 5–40)

## 2020-04-23 LAB — THYROID PANEL WITH TSH
Free Thyroxine Index: 1.4 (ref 1.2–4.9)
T3 Uptake Ratio: 23 % — ABNORMAL LOW (ref 24–39)
T4, Total: 6.1 ug/dL (ref 4.5–12.0)
TSH: 4.92 u[IU]/mL — ABNORMAL HIGH (ref 0.450–4.500)

## 2020-05-03 ENCOUNTER — Other Ambulatory Visit: Payer: Self-pay | Admitting: Nurse Practitioner

## 2020-05-23 ENCOUNTER — Telehealth: Payer: Self-pay | Admitting: Pulmonary Disease

## 2020-05-23 NOTE — Telephone Encounter (Signed)
I called and spoke with pt and she stated that on Saturday night she woke up gasping for air while wearing her cpap machine.  She stated that she has never done this before.  She stated that the machine was still running.  She said that it took her a while to go back to sleep and she said that she has not felt great Sunday or today.  Wanting to check with VS to see if her settings need to be adjusted?  Please advise. Thanks

## 2020-05-23 NOTE — Telephone Encounter (Signed)
DL printed  Spoke with the pt  She states has not noticed any issue with mask leaking, but it's probably time for new nasal pillows bc hers are worn out  She states that she has been tossing and turning a lot, not related to CPAP, just can not get comfortable  She admits to caffeine later in the day at times  I advised try not to have any after lunch time  Will forward to Dr Halford Chessman and am taking DL over to him for review, thanks

## 2020-05-23 NOTE — Telephone Encounter (Signed)
Auto CPAP 04/23/20 to 05/22/20 >> used on 30 of 30 nights with average 9 hrs 52 min.  Average AHI 0.1 with median CPAP 9 and 95 th percentile CPAP 13 cm H2O.   Please let her know her CPAP report shows good control of sleep apnea on current settings and no signs of mask leak.  If her symptoms persist, then she will need an ROV to assess further.

## 2020-05-23 NOTE — Telephone Encounter (Signed)
Spoke with the pt and notified of recs per VS  She verbalized understanding  Nothing further needed 

## 2020-05-23 NOTE — Telephone Encounter (Signed)
Please get a copy of her last 30 days CPAP download.  Please also check whether she has noticed any issues with mask leak.

## 2020-07-04 ENCOUNTER — Other Ambulatory Visit: Payer: Self-pay | Admitting: Nurse Practitioner

## 2020-07-12 ENCOUNTER — Other Ambulatory Visit: Payer: Self-pay | Admitting: Nurse Practitioner

## 2020-07-12 DIAGNOSIS — M545 Low back pain, unspecified: Secondary | ICD-10-CM

## 2020-08-02 ENCOUNTER — Other Ambulatory Visit: Payer: Self-pay | Admitting: Nurse Practitioner

## 2020-08-02 DIAGNOSIS — I471 Supraventricular tachycardia: Secondary | ICD-10-CM

## 2020-08-11 ENCOUNTER — Encounter: Payer: Self-pay | Admitting: Internal Medicine

## 2020-08-13 ENCOUNTER — Other Ambulatory Visit: Payer: Self-pay | Admitting: Nurse Practitioner

## 2020-08-13 DIAGNOSIS — I471 Supraventricular tachycardia: Secondary | ICD-10-CM

## 2020-08-23 ENCOUNTER — Telehealth: Payer: Self-pay | Admitting: Internal Medicine

## 2020-08-23 NOTE — Telephone Encounter (Signed)
Left message for pt to call back  °

## 2020-08-23 NOTE — Telephone Encounter (Signed)
Patient called said she is having a lot of abdominal pain along with cramping and diarrhea had to miss work yesterday. She will be in a meeting this morning from 9:00 am to 9:30 am requesting a call back after that to advise.

## 2020-08-25 NOTE — Telephone Encounter (Signed)
Spoke with pt and she states she has had to miss work due to her diarrhea and stomach discomfort. Pt requesting to be seen. Pt scheduled to see Dr. Henrene Pastor 09/02/20 at 8:20am. Pt aware of appt.

## 2020-09-02 ENCOUNTER — Ambulatory Visit: Payer: 59 | Admitting: Internal Medicine

## 2020-09-02 ENCOUNTER — Other Ambulatory Visit: Payer: Self-pay | Admitting: Nurse Practitioner

## 2020-09-02 DIAGNOSIS — M545 Low back pain, unspecified: Secondary | ICD-10-CM

## 2020-10-24 ENCOUNTER — Ambulatory Visit: Payer: 59 | Admitting: Nurse Practitioner

## 2020-10-31 ENCOUNTER — Other Ambulatory Visit: Payer: Self-pay | Admitting: Nurse Practitioner

## 2020-10-31 DIAGNOSIS — K219 Gastro-esophageal reflux disease without esophagitis: Secondary | ICD-10-CM

## 2020-10-31 DIAGNOSIS — G43901 Migraine, unspecified, not intractable, with status migrainosus: Secondary | ICD-10-CM

## 2020-10-31 DIAGNOSIS — I471 Supraventricular tachycardia: Secondary | ICD-10-CM

## 2020-10-31 DIAGNOSIS — M545 Low back pain, unspecified: Secondary | ICD-10-CM

## 2020-11-03 ENCOUNTER — Other Ambulatory Visit: Payer: Self-pay

## 2020-11-03 ENCOUNTER — Ambulatory Visit: Payer: 59 | Admitting: Nurse Practitioner

## 2020-11-03 ENCOUNTER — Encounter: Payer: Self-pay | Admitting: Nurse Practitioner

## 2020-11-03 VITALS — BP 125/81 | HR 75 | Temp 97.6°F | Resp 20 | Ht 66.0 in | Wt 269.0 lb

## 2020-11-03 DIAGNOSIS — K219 Gastro-esophageal reflux disease without esophagitis: Secondary | ICD-10-CM

## 2020-11-03 DIAGNOSIS — E039 Hypothyroidism, unspecified: Secondary | ICD-10-CM | POA: Diagnosis not present

## 2020-11-03 DIAGNOSIS — G43901 Migraine, unspecified, not intractable, with status migrainosus: Secondary | ICD-10-CM

## 2020-11-03 DIAGNOSIS — N3289 Other specified disorders of bladder: Secondary | ICD-10-CM

## 2020-11-03 DIAGNOSIS — G4733 Obstructive sleep apnea (adult) (pediatric): Secondary | ICD-10-CM | POA: Diagnosis not present

## 2020-11-03 DIAGNOSIS — R3915 Urgency of urination: Secondary | ICD-10-CM

## 2020-11-03 DIAGNOSIS — F410 Panic disorder [episodic paroxysmal anxiety] without agoraphobia: Secondary | ICD-10-CM

## 2020-11-03 DIAGNOSIS — I471 Supraventricular tachycardia: Secondary | ICD-10-CM | POA: Diagnosis not present

## 2020-11-03 MED ORDER — PROPRANOLOL HCL 80 MG PO TABS: 80.0000 mg | ORAL_TABLET | Freq: Every day | ORAL | 1 refills | Status: DC

## 2020-11-03 MED ORDER — LEVOTHYROXINE SODIUM 100 MCG PO TABS: 100.0000 ug | ORAL_TABLET | Freq: Every day | ORAL | 1 refills | Status: DC

## 2020-11-03 MED ORDER — OXYBUTYNIN CHLORIDE ER 5 MG PO TB24: 5.0000 mg | ORAL_TABLET | Freq: Every day | ORAL | 1 refills | Status: DC

## 2020-11-03 MED ORDER — LIOTHYRONINE SODIUM 5 MCG PO TABS: 5.0000 ug | ORAL_TABLET | Freq: Every day | ORAL | 1 refills | Status: DC

## 2020-11-03 MED ORDER — CLONAZEPAM 0.5 MG PO TABS: 0.5000 mg | ORAL_TABLET | Freq: Two times a day (BID) | ORAL | 5 refills | Status: DC | PRN

## 2020-11-03 MED ORDER — PANTOPRAZOLE SODIUM 40 MG PO TBEC: 40.0000 mg | DELAYED_RELEASE_TABLET | Freq: Two times a day (BID) | ORAL | 1 refills | Status: DC

## 2020-11-03 MED ORDER — METOCLOPRAMIDE HCL 10 MG PO TABS: 10.0000 mg | ORAL_TABLET | Freq: Every day | ORAL | 1 refills | Status: DC

## 2020-11-03 MED ORDER — OMEPRAZOLE 40 MG PO CPDR: 40.0000 mg | DELAYED_RELEASE_CAPSULE | Freq: Every day | ORAL | 1 refills | Status: DC

## 2020-11-03 NOTE — Progress Notes (Signed)
Subjective:    Patient ID: [name] [name], female    DOB: 1970/10/18, 50 y.o.   MRN: [id number]   Chief Complaint: Medical Management of Chronic Issues    HPI:  1. Acquired hypothyroidism No problems that aware of.. No changes were to meds at last visit Lab Results  Component Value Date   TSH 4.920 (H) 04/22/2020     2. Migraine with status migrainosus, not intractable, unspecified migraine type  3. SVT (supraventricular tachycardia) ([facility]) No recent tachycardia  4. OSA (obstructive sleep apnea) Wears CPAP nightly. Feels well rested in mornings  5. Gastroesophageal reflux disease without esophagitis Is on omeprazole daily  6. Panic attacks Is on klonopin daily and is doing well. No recent panic attacks. GAD 7 : Generalized Anxiety Score 11/03/2020  Nervous, Anxious, on Edge 0  Control/stop worrying 0  Worry too much - different things 0  Trouble relaxing 0  Restless 0  Easily annoyed or irritable 2  Afraid - awful might happen 0  Total GAD 7 Score 2  Anxiety Difficulty Somewhat difficult      7. Urinary urgency Is on ditropan daily and is doing well.  8. Morbid obesity ([place]) Weight is up 9lbs. She has gone back to [facility] MD for weight loss. Wt Readings from Last 3 Encounters:  11/03/20 269 lb (122 kg)  04/22/20 260 lb (117.9 kg)  02/01/20 254 lb 9.6 oz (115.5 kg)   BMI Readings from Last 3 Encounters:  11/03/20 43.42 kg/m  04/22/20 41.97 kg/m  02/01/20 41.09 kg/m       Outpatient Encounter Medications as of 11/03/2020  Medication Sig   clonazePAM (KLONOPIN) 0.5 MG tablet TAKE 1 TABLET BY MOUTH TWICE DAILY AS NEEDED   cyclobenzaprine (FLEXERIL) 10 MG tablet TAKE 2 TABLETS BY MOUTH AT BEDTIME   fexofenadine (ALLEGRA) 180 MG tablet Take 180 mg by mouth daily.   levothyroxine (SYNTHROID) 100 MCG tablet TAKE 1 TABLET BY MOUTH EVERY DAY   liothyronine (CYTOMEL) 5 MCG tablet TAKE 1 TABLET BY MOUTH DAILY   medroxyPROGESTERone (DEPO-PROVERA) 150 MG/ML  injection Inject 150 mg into the muscle every 3 (three) months.    metoCLOPramide (REGLAN) 10 MG tablet TAKE 1 TABLET BY MOUTH EVERY DAY   mometasone (NASONEX) 50 MCG/ACT nasal spray INSTILL 2 SPRAYS IN EACH NOSTRIL EVERY DAY   naproxen (NAPROSYN) 500 MG tablet Take 1 tablet (500 mg total) by mouth daily as needed for mild pain.   omeprazole (PRILOSEC) 40 MG capsule TAKE 1 CAPSULE BY MOUTH EVERY DAY   oxybutynin (DITROPAN-XL) 5 MG 24 hr tablet Take 1 tablet (5 mg total) by mouth at bedtime.   pantoprazole (PROTONIX) 40 MG tablet Take 1 tablet (40 mg total) by mouth 2 (two) times daily.   propranolol (INDERAL) 80 MG tablet TAKE 1 TABLET BY MOUTH EVERY DAY   azelastine (ASTELIN) 0.1 % nasal spray Place 1 spray into both nostrils 2 (two) times daily. Use in each nostril as directed (Patient not taking: Reported on 11/03/2020)   ondansetron (ZOFRAN) 8 MG tablet Take 1 tablet (8 mg total) by mouth every 8 (eight) hours as needed for nausea or vomiting. (Patient not taking: Reported on 11/03/2020)   No facility-administered encounter medications on file as of 11/03/2020.    Past Surgical History:  Procedure Laterality Date   CESAREAN SECTION     1194   COLONOSCOPY     KNEE ARTHROSCOPY     LITHOTRIPSY  12/2014    Family History  Problem  Relation Age of Onset   Heart disease Maternal Grandfather    Heart disease Maternal Grandmother    Lung cancer Father        died from   Breast cancer Paternal Grandmother        paternal aunts x2   Hypertension Mother    Colon cancer Neg Hx    Esophageal cancer Neg Hx    Rectal cancer Neg Hx    Stomach cancer Neg Hx     New complaints: None today  Social history: Lives with her mom  Controlled substance contract: 04/26/20     Review of Systems  Constitutional:  Negative for diaphoresis.  Eyes:  Negative for pain.  Respiratory:  Negative for shortness of breath.   Cardiovascular:  Negative for chest pain, palpitations and leg swelling.   Gastrointestinal:  Negative for abdominal pain.  Endocrine: Negative for polydipsia.  Skin:  Negative for rash.  Neurological:  Negative for dizziness, weakness and headaches.  Hematological:  Does not bruise/bleed easily.  All other systems reviewed and are negative.     Objective:   Physical Exam Vitals and nursing note reviewed.  Constitutional:      General: [name] not in acute distress.    Appearance: Normal appearance. [name] well-developed.  HENT:     Head: Normocephalic.     Right Ear: Tympanic membrane normal.     Left Ear: Tympanic membrane normal.     Nose: Nose normal.     Mouth/Throat:     Mouth: Mucous membranes are moist.  Eyes:     Pupils: Pupils are equal, round, and reactive to light.  Neck:     Vascular: No carotid bruit or JVD.  Cardiovascular:     Rate and Rhythm: Normal rate and regular rhythm.     Heart sounds: Normal heart sounds.  Pulmonary:     Effort: Pulmonary effort is normal. No respiratory distress.     Breath sounds: Normal breath sounds. No wheezing or rales.  Chest:     Chest wall: No tenderness.  Abdominal:     General: Bowel sounds are normal. There is no distension or abdominal bruit.     Palpations: Abdomen is soft. There is no hepatomegaly, splenomegaly, mass or pulsatile mass.     Tenderness: There is no abdominal tenderness.  Musculoskeletal:        General: Normal range of motion.     Cervical back: Normal range of motion and neck supple.  Lymphadenopathy:     Cervical: No cervical adenopathy.  Skin:    General: Skin is warm and dry.  Neurological:     Mental Status: [name] alert and oriented to person, place, and time.     Deep Tendon Reflexes: Reflexes are normal and symmetric.  Psychiatric:        Behavior: Behavior normal.        Thought Content: Thought content normal.        Judgment: Judgment normal.    BP 125/81   Pulse 75   Temp 97.6 F (36.4 C) (Temporal)   Resp 20   Ht $R'5\' 6"'MR$  (1.676 m)   Wt 269 lb (122  kg)   SpO2 97%   BMI 43.42 kg/m        Assessment & Plan:  Seila Liston comes in today with chief complaint of Medical Management of Chronic Issues   Diagnosis and orders addressed:  1. Acquired hypothyroidism Labs pending - levothyroxine (SYNTHROID) 100 MCG tablet; Take 1  tablet (100 mcg total) by mouth daily.  Dispense: 90 tablet; Refill: 1 - CBC with Differential/Platelet - CMP14+EGFR - Thyroid Panel With TSH  2. Migraine with status migrainosus, not intractable, unspecified migraine type Avoid caffeine - propranolol (INDERAL) 80 MG tablet; Take 1 tablet (80 mg total) by mouth daily.  Dispense: 90 tablet; Refill: 1  3. SVT (supraventricular tachycardia) (HCC) Avoid caffeine - clonazePAM (KLONOPIN) 0.5 MG tablet; Take 1 tablet (0.5 mg total) by mouth 2 (two) times daily as needed.  Dispense: 60 tablet; Refill: 5  4. OSA (obstructive sleep apnea) Continue to wear CPAP  5. Gastroesophageal reflux disease without esophagitis Avoid spicy foods Do not eat 2 hours prior to bedtime - omeprazole (PRILOSEC) 40 MG capsule; Take 1 capsule (40 mg total) by mouth daily.  Dispense: 90 capsule; Refill: 1 - metoCLOPramide (REGLAN) 10 MG tablet; Take 1 tablet (10 mg total) by mouth daily.  Dispense: 90 tablet; Refill: 1 - pantoprazole (PROTONIX) 40 MG tablet; Take 1 tablet (40 mg total) by mouth 2 (two) times daily.  Dispense: 180 tablet; Refill: 1  6. Panic attacks Stress management  7. Urinary urgency - oxybutynin (DITROPAN-XL) 5 MG 24 hr tablet; Take 1 tablet (5 mg total) by mouth at bedtime.  Dispense: 90 tablet; Refill: 1   8. Morbid obesity ([place]) Discussed diet and exercise for person with BMI >25 Will recheck weight in 3-6 months - Lipid panel   Labs pending Health Maintenance reviewed Diet and exercise encouraged  Follow up plan: 6 months   [place], FNP

## 2020-11-03 NOTE — Patient Instructions (Signed)

## 2020-11-04 LAB — THYROID PANEL WITH TSH
Free Thyroxine Index: 1.3 (ref 1.2–4.9)
T3 Uptake Ratio: 22 % — ABNORMAL LOW (ref 24–39)
T4, Total: 5.9 ug/dL (ref 4.5–12.0)
TSH: 3.09 u[IU]/mL (ref 0.450–4.500)

## 2020-11-04 LAB — CBC WITH DIFFERENTIAL/PLATELET
Basophils Absolute: 0.1 10*3/uL (ref 0.0–0.2)
Basos: 1 %
EOS (ABSOLUTE): 0.2 10*3/uL (ref 0.0–0.4)
Eos: 2 %
Hematocrit: 43.3 % (ref 34.0–46.6)
Hemoglobin: 14.5 g/dL (ref 11.1–15.9)
Immature Grans (Abs): 0 10*3/uL (ref 0.0–0.1)
Immature Granulocytes: 0 %
Lymphocytes Absolute: 4.2 10*3/uL — ABNORMAL HIGH (ref 0.7–3.1)
Lymphs: 39 %
MCH: 29.8 pg (ref 26.6–33.0)
MCHC: 33.5 g/dL (ref 31.5–35.7)
MCV: 89 fL (ref 79–97)
Monocytes Absolute: 0.8 10*3/uL (ref 0.1–0.9)
Monocytes: 7 %
Neutrophils Absolute: 5.3 10*3/uL (ref 1.4–7.0)
Neutrophils: 51 %
Platelets: 268 10*3/uL (ref 150–450)
RBC: 4.86 x10E6/uL (ref 3.77–5.28)
RDW: 13.6 % (ref 11.7–15.4)
WBC: 10.6 10*3/uL (ref 3.4–10.8)

## 2020-11-04 LAB — LIPID PANEL
Chol/HDL Ratio: 3.8 ratio (ref 0.0–4.4)
Cholesterol, Total: 147 mg/dL (ref 100–199)
HDL: 39 mg/dL — ABNORMAL LOW (ref >39)
LDL Chol Calc (NIH): 77 mg/dL (ref 0–99)
Triglycerides: 180 mg/dL — ABNORMAL HIGH (ref 0–149)
VLDL Cholesterol Cal: 31 mg/dL (ref 5–40)

## 2020-11-04 LAB — CMP14+EGFR
ALT: 26 IU/L (ref 0–32)
AST: 32 IU/L (ref 0–40)
Albumin/Globulin Ratio: 1.8 (ref 1.2–2.2)
Albumin: 4.6 g/dL (ref 3.8–4.8)
Alkaline Phosphatase: 84 IU/L (ref 44–121)
BUN/Creatinine Ratio: 14 (ref 9–23)
BUN: 12 mg/dL (ref 6–24)
Bilirubin Total: 0.2 mg/dL (ref 0.0–1.2)
CO2: 18 mmol/L — ABNORMAL LOW (ref 20–29)
Calcium: 9.5 mg/dL (ref 8.7–10.2)
Chloride: 106 mmol/L (ref 96–106)
Creatinine, Ser: 0.88 mg/dL (ref 0.57–1.00)
Globulin, Total: 2.5 g/dL (ref 1.5–4.5)
Glucose: 111 mg/dL — ABNORMAL HIGH (ref 65–99)
Potassium: 4.2 mmol/L (ref 3.5–5.2)
Sodium: 143 mmol/L (ref 134–144)
Total Protein: 7.1 g/dL (ref 6.0–8.5)
eGFR: 81 mL/min/{1.73_m2} (ref >59)

## 2020-11-07 ENCOUNTER — Other Ambulatory Visit: Payer: Self-pay

## 2020-11-07 ENCOUNTER — Other Ambulatory Visit: Payer: Self-pay | Admitting: Nurse Practitioner

## 2020-11-07 ENCOUNTER — Ambulatory Visit (AMBULATORY_SURGERY_CENTER): Payer: Self-pay | Admitting: *Deleted

## 2020-11-07 VITALS — Ht 66.0 in | Wt 266.0 lb

## 2020-11-07 DIAGNOSIS — Z86010 Personal history of colonic polyps: Secondary | ICD-10-CM

## 2020-11-07 MED ORDER — SUTAB 1479-225-188 MG PO TABS: 1.0000 | ORAL_TABLET | Freq: Once | ORAL | 0 refills | Status: AC

## 2020-11-07 NOTE — Progress Notes (Signed)
Pre-visit completed in-person.   No egg or soy allergy known to patient  No issues with past sedation with any surgeries or procedures Patient denies ever being told they had issues or difficulty with intubation  No FH of Malignant Hyperthermia No diet pills per patient No home 02 use per patient  No blood thinners per patient  Pt denies issues with constipation  No A fib or A flutter  EMMI video to pt or via [place] 19 guidelines implemented in PV today with Pt and RN   Pt is fully vaccinated  for Covid  Coupon given to pt in PV today , Code to Pharmacy and  NO PA's for preps discussed with pt In PV today  Discussed with pt there will be an out-of-pocket cost for prep and that varies from $0 to 70 +  dollars   Due to the COVID-19 pandemic we are asking patients to follow certain guidelines.  Pt aware of COVID protocols and [facility] guidelines

## 2020-11-21 ENCOUNTER — Telehealth: Payer: Self-pay | Admitting: Gastroenterology

## 2020-11-21 ENCOUNTER — Telehealth: Payer: Self-pay | Admitting: Internal Medicine

## 2020-11-21 ENCOUNTER — Telehealth: Payer: Self-pay

## 2020-11-21 ENCOUNTER — Encounter: Payer: 59 | Admitting: Internal Medicine

## 2020-11-21 NOTE — Telephone Encounter (Signed)
[name] [name], If the patient reschedules I would recommend a follow-up magnesium citrate the morning prior to the procedure to be followed by standard split prep.  Thanks

## 2020-11-21 NOTE — Telephone Encounter (Signed)
Pt was rescheduled for Colon and previsit. Colon scheduled for 12/15/20 at 4 PM with Dr. Henrene Pastor  along with a previsit 11/30/20 @ 11 AM . Pt made aware.

## 2020-11-21 NOTE — Telephone Encounter (Signed)
On call phone note: Pt relates that she has taken both dose of her bowel prep and has had no results at all. She denies N/V/abd pain. No laxatives at home. She feels she cannot prep in time for her colonoscopy this morning and wants to rescheduled. Advised prep might still work however unlikely her prep would be adequate. If effluent becomes clear she should come for colonoscopy and if not please call after 8 am today to reschedule with another prep.

## 2020-11-21 NOTE — Telephone Encounter (Signed)
Good Morning Dr. Henrene Pastor  I called patient this morning to see if she would be coming for her procedure today, however she did not answer, patient than called back to say that she spoke with on call doctor this morning.  She stated that she told him that she had taken the pills for prep but nothing was happening and she just started to go this morning.  She cancelled her appointment she did not think she would be cleaned out.

## 2020-11-30 ENCOUNTER — Ambulatory Visit (AMBULATORY_SURGERY_CENTER): Payer: 59

## 2020-11-30 ENCOUNTER — Other Ambulatory Visit: Payer: Self-pay

## 2020-11-30 ENCOUNTER — Encounter: Payer: Self-pay | Admitting: Internal Medicine

## 2020-11-30 ENCOUNTER — Other Ambulatory Visit: Payer: Self-pay | Admitting: Nurse Practitioner

## 2020-11-30 VITALS — Ht 66.0 in | Wt 260.0 lb

## 2020-11-30 DIAGNOSIS — M545 Low back pain, unspecified: Secondary | ICD-10-CM

## 2020-11-30 DIAGNOSIS — Z86010 Personal history of colonic polyps: Secondary | ICD-10-CM

## 2020-11-30 MED ORDER — PEG-KCL-NACL-NASULF-NA ASC-C 100 G PO SOLR: 1.0000 | Freq: Once | ORAL | 0 refills | Status: AC

## 2020-11-30 NOTE — Progress Notes (Signed)
Pre visit completed via phone call; patient verified name, DOB, and address;  No egg or soy allergy known to patient  No issues known to pt with past sedation with any surgeries or procedures Patient denies ever being told they had issues or difficulty with intubation  No FH of Malignant Hyperthermia Pt is not on diet pills Pt is not on  home 02  Pt is not on blood thinners  Pt denies issues with constipation  No A fib or A flutter  EMMI video via MyChart  COVID 19 guidelines implemented in PV today with Pt and RN  Pt is fully vaccinated for Covid x 2 + booster; Coupon given to pt in PV today and NO PA's for preps discussed with pt in PV today  Discussed with pt there will be an out-of-pocket cost for prep and that varies from $0 to 70 +  dollars  Due to the COVID-19 pandemic we are asking patients to follow certain guidelines.  Pt aware of COVID protocols and [facility] guidelines

## 2020-12-15 ENCOUNTER — Encounter: Payer: 59 | Admitting: Internal Medicine

## 2020-12-28 ENCOUNTER — Other Ambulatory Visit: Payer: Self-pay

## 2020-12-28 ENCOUNTER — Encounter: Payer: Self-pay | Admitting: Internal Medicine

## 2020-12-28 ENCOUNTER — Ambulatory Visit (AMBULATORY_SURGERY_CENTER): Payer: 59 | Admitting: Internal Medicine

## 2020-12-28 VITALS — BP 129/77 | HR 71 | Temp 97.6°F | Resp 12 | Ht 66.0 in | Wt 260.0 lb

## 2020-12-28 DIAGNOSIS — Z86010 Personal history of colonic polyps: Secondary | ICD-10-CM

## 2020-12-28 MED ORDER — SODIUM CHLORIDE 0.9 % IV SOLN: 500.0000 mL | Freq: Once | INTRAVENOUS | Status: DC

## 2020-12-28 NOTE — Op Note (Signed)
[place] Patient Name: [name] [name] Procedure Date: 12/28/2020 8:40 AM MRN: [id number] Endoscopist: Docia Chuck. Henrene Pastor , MD Age: 50 Referring MD:  Date of Birth: 1970-07-07 Gender: Female Account #: [id number] Procedure:                Colonoscopy Indications:              High risk colon cancer surveillance: Personal                            history of multiple (3 or more) adenomas, High risk                            colon cancer surveillance: Personal history of                            sessile serrated colon polyp (less than 10 mm in                            size) with no dysplasia. Previous examinations                            2011, 2013, 2019 Medicines:                Monitored Anesthesia Care Procedure:                Pre-Anesthesia Assessment:                           - Prior to the procedure, a History and Physical                            was performed, and patient medications and                            allergies were reviewed. The patient's tolerance of                            previous anesthesia was also reviewed. The risks                            and benefits of the procedure and the sedation                            options and risks were discussed with the patient.                            All questions were answered, and informed consent                            was obtained. Prior Anticoagulants: The patient has                            taken no previous anticoagulant or antiplatelet  agents. ASA Grade Assessment: II - A patient with                            mild systemic disease. After reviewing the risks                            and benefits, the patient was deemed in                            satisfactory condition to undergo the procedure.                           After obtaining informed consent, the colonoscope                            was passed under direct vision. Throughout the                             procedure, the patient's blood pressure, pulse, and                            oxygen saturations were monitored continuously. The                            CF [id number] #[id number] was introduced through the anus                            and advanced to the the cecum, identified by                            appendiceal orifice and ileocecal valve. The                            ileocecal valve, appendiceal orifice, and rectum                            were photographed. The quality of the bowel                            preparation was excellent. The colonoscopy was                            performed without difficulty. The patient tolerated                            the procedure well. The bowel preparation used was                            MoviPrep via split dose instruction. Scope In: 8:58:01 AM Scope Out: 9:14:53 AM Scope Withdrawal Time: 0 hours 12 minutes 37 seconds  Total Procedure Duration: 0 hours 16 minutes 52 seconds  Findings:                 The entire examined colon appeared normal on direct  and retroflexion views. Complications:            No immediate complications. Estimated blood loss:                            None. Estimated Blood Loss:     Estimated blood loss: none. Impression:               - The entire examined colon is normal on direct and                            retroflexion views.                           - No specimens collected. Recommendation:           - Repeat colonoscopy in 5 years for surveillance.                           - Patient has a contact number available for                            emergencies. The signs and symptoms of potential                            delayed complications were discussed with the                            patient. Return to normal activities tomorrow.                            Written discharge instructions were provided to the                             patient.                           - Resume previous diet.                           - Continue present medications. Docia Chuck. Henrene Pastor, MD 12/28/2020 9:22:20 AM This report has been signed electronically.

## 2020-12-28 NOTE — Patient Instructions (Signed)
YOU HAD AN ENDOSCOPIC PROCEDURE TODAY AT THE [facility]:   Refer to the procedure report that was given to you for any specific questions about what was found during the examination.  If the procedure report does not answer your questions, please call your gastroenterologist to clarify.  If you requested that your care partner not be given the details of your procedure findings, then the procedure report has been included in a sealed envelope for you to review at your convenience later.  YOU SHOULD EXPECT: Some feelings of bloating in the abdomen. Passage of more gas than usual.  Walking can help get rid of the air that was put into your GI tract during the procedure and reduce the bloating. If you had a lower endoscopy (such as a colonoscopy or flexible sigmoidoscopy) you may notice spotting of blood in your stool or on the toilet paper. If you underwent a bowel prep for your procedure, you may not have a normal bowel movement for a few days.  Please Note:  You might notice some irritation and congestion in your nose or some drainage.  This is from the oxygen used during your procedure.  There is no need for concern and it should clear up in a day or so.  SYMPTOMS TO REPORT IMMEDIATELY:   Following lower endoscopy (colonoscopy or flexible sigmoidoscopy):  Excessive amounts of blood in the stool  Significant tenderness or worsening of abdominal pains  Swelling of the abdomen that is new, acute  Fever of 100F or higher  For urgent or emergent issues, a gastroenterologist can be reached at any hour by calling [phone]. Do not use MyChart messaging for urgent concerns.    DIET:  We do recommend a small meal at first, but then you may proceed to your regular diet.  Drink plenty of fluids but you should avoid alcoholic beverages for 24 hours.  ACTIVITY:  You should plan to take it easy for the rest of today and you should NOT DRIVE or use heavy machinery until tomorrow (because  of the sedation medicines used during the test).    FOLLOW UP: Our staff will call the number listed on your records 48-72 hours following your procedure to check on you and address any questions or concerns that you may have regarding the information given to you following your procedure. If we do not reach you, we will leave a message.  We will attempt to reach you two times.  During this call, we will ask if you have developed any symptoms of COVID 19. If you develop any symptoms (ie: fever, flu-like symptoms, shortness of breath, cough etc.) before then, please call [phone].  If you test positive for Covid 19 in the 2 weeks post procedure, please call and report this information to us.    If any biopsies were taken you will be contacted by phone or by letter within the next 1-3 weeks.  Please call us at [phone] if you have not heard about the biopsies in 3 weeks.    SIGNATURES/CONFIDENTIALITY: You and/or your care partner have signed paperwork which will be entered into your electronic medical record.  These signatures attest to the fact that that the information above on your After Visit Summary has been reviewed and is understood.  Full responsibility of the confidentiality of this discharge information lies with you and/or your care-partner. 

## 2020-12-28 NOTE — Progress Notes (Signed)
Pt Drowsy. VSS. To PACU, report to RN. No anesthetic complications noted.  

## 2020-12-28 NOTE — Progress Notes (Signed)
HISTORY OF PRESENT ILLNESS:  [name] [name] is a 50 y.o. female with a history of multiple sessile serrated polyps and multiple tubular adenomas.  She presents today for follow-up colonoscopy for the purposes of surveillance.  Previous examinations 2011, 2013, 2019.  No active complaints  REVIEW OF SYSTEMS:  All non-GI ROS negative.  Past Medical History:  Diagnosis Date   Allergy    SEASONAL   Anxiety    Chronic headaches    Colon polyps    GERD (gastroesophageal reflux disease)    History of kidney stones    Hyperlipidemia    Hypothyroidism    Palpitations    Sleep apnea     Past Surgical History:  Procedure Laterality Date   CESAREAN SECTION     1194   COLONOSCOPY     KNEE ARTHROSCOPY     LITHOTRIPSY  12/2014   ROTATOR CUFF REPAIR Right 2021    Social History [name] [name]  reports that she quit smoking about 2 years ago. Her smoking use included cigarettes. She started smoking about 12 years ago. She has a 5.00 pack-year smoking history. She has never used smokeless tobacco. She reports current alcohol use. She reports that she does not use drugs.  family history includes Breast cancer in her paternal grandmother; Heart disease in her maternal grandfather and maternal grandmother; Hypertension in her mother; Lung cancer in her father.  No Known Allergies     PHYSICAL EXAMINATION:  Vital signs: BP 134/72   Pulse 75   Temp 97.6 F (36.4 C) (Skin)   Ht 5\' 6"  (1.676 m)   Wt 260 lb (117.9 kg)   SpO2 96%   BMI 41.97 kg/m  General: Well-developed, well-nourished, no acute distress HEENT: Sclerae are anicteric, conjunctiva pink. Oral mucosa intact Lungs: Clear Heart: Regular Abdomen: soft, nontender, nondistended, no obvious ascites, no peritoneal signs, normal bowel sounds. No organomegaly. Extremities: No edema Psychiatric: alert and oriented x3. Cooperative     ASSESSMENT:  1.  History of multiple adenomatous and sessile serrated polyps.  Due for  surveillance   PLAN:  1.  Surveillance colonoscopy

## 2020-12-28 NOTE — Progress Notes (Signed)
Pt's states no medical or surgical changes since previsit or office visit. VS assessed by C.W 

## 2020-12-30 ENCOUNTER — Telehealth: Payer: Self-pay

## 2020-12-30 ENCOUNTER — Telehealth: Payer: Self-pay | Admitting: *Deleted

## 2020-12-30 NOTE — Telephone Encounter (Signed)
  Follow up Call-  Call back number 12/28/2020  Post procedure Call Back phone  # [phone]  Permission to leave phone message Yes  Some recent data might be hidden     Patient questions:  Message left to call us if necessary.

## 2020-12-30 NOTE — Telephone Encounter (Signed)
Second post procedure follow up call, no answer 

## 2021-01-01 ENCOUNTER — Other Ambulatory Visit: Payer: Self-pay | Admitting: Nurse Practitioner

## 2021-01-01 DIAGNOSIS — M545 Low back pain, unspecified: Secondary | ICD-10-CM

## 2021-01-24 ENCOUNTER — Ambulatory Visit: Payer: 59 | Admitting: Nurse Practitioner

## 2021-01-24 ENCOUNTER — Other Ambulatory Visit: Payer: Self-pay

## 2021-01-24 ENCOUNTER — Encounter: Payer: Self-pay | Admitting: Nurse Practitioner

## 2021-01-24 VITALS — BP 108/71 | HR 58 | Temp 97.2°F | Resp 20 | Ht 66.0 in | Wt 244.0 lb

## 2021-01-24 DIAGNOSIS — B029 Zoster without complications: Secondary | ICD-10-CM

## 2021-01-24 MED ORDER — VALACYCLOVIR HCL 1 G PO TABS: 1000.0000 mg | ORAL_TABLET | Freq: Three times a day (TID) | ORAL | 0 refills | Status: DC

## 2021-01-24 NOTE — Patient Instructions (Signed)

## 2021-01-24 NOTE — Progress Notes (Signed)
   Subjective:    Patient ID: [name] [name], female    DOB: 1970/04/12, 50 y.o.   MRN: [id number]  Chief Complaint: Red patches on back (Itchy at times/)   HPI Patient is in today c/o rash on her back. Started yesterday morning. Is itchy. Has spread some since yesterday. She went to urgent carein [place] and dx her allergic dermatitis. She was given steroid cream.    Review of Systems  Constitutional:  Negative for diaphoresis.  Eyes:  Negative for pain.  Respiratory:  Negative for shortness of breath.   Cardiovascular:  Negative for chest pain, palpitations and leg swelling.  Gastrointestinal:  Negative for abdominal pain.  Endocrine: Negative for polydipsia.  Skin:  Negative for rash.  Neurological:  Negative for dizziness, weakness and headaches.  Hematological:  Does not bruise/bleed easily.  All other systems reviewed and are negative.     Objective:   Physical Exam Vitals and nursing note reviewed.  Constitutional:      Appearance: Normal appearance. [name] obese.  Cardiovascular:     Rate and Rhythm: Normal rate and regular rhythm.     Pulses: Normal pulses.  Pulmonary:     Breath sounds: Normal breath sounds.  Skin:    General: Skin is warm.     Findings: Rash (erythematous patchy rash on left upper back) present.  Neurological:     Mental Status: [name] alert.    BP 108/71   Pulse (!) 58   Temp (!) 97.2 F (36.2 C) (Temporal)   Resp 20   Ht 5\' 6"  (1.676 m)   Wt 244 lb (110.7 kg)   SpO2 94%   BMI 39.38 kg/m         Assessment & Plan:   [name] [name] in today with chief complaint of Red patches on back (Itchy at times/)   1. Herpes zoster without complication Capsasin topically Cool  compresses Avoid scratching or rubbing  Meds ordered this encounter  Medications   valACYclovir (VALTREX) 1000 MG tablet    Sig: Take 1 tablet (1,000 mg total) by mouth 3 (three) times daily.    Dispense:  21 tablet    Refill:  0    Order Specific Question:    Supervising Provider    Answer:   Caryl Pina A [[id number]]         The above assessment and management plan was discussed with the patient. The patient verbalized understanding of and has agreed to the management plan. Patient is aware to call the clinic if symptoms persist or worsen. Patient is aware when to return to the clinic for a follow-up visit. Patient educated on when it is appropriate to go to the emergency department.   Mary-Margaret Hassell Done, FNP

## 2021-01-30 ENCOUNTER — Other Ambulatory Visit: Payer: Self-pay | Admitting: Nurse Practitioner

## 2021-01-30 DIAGNOSIS — M545 Low back pain, unspecified: Secondary | ICD-10-CM

## 2021-01-31 ENCOUNTER — Encounter: Payer: Self-pay | Admitting: Nurse Practitioner

## 2021-01-31 ENCOUNTER — Other Ambulatory Visit: Payer: Self-pay

## 2021-01-31 ENCOUNTER — Ambulatory Visit: Payer: 59 | Admitting: Nurse Practitioner

## 2021-01-31 VITALS — BP 109/72 | HR 71 | Temp 96.6°F | Resp 20 | Ht 66.0 in | Wt 244.0 lb

## 2021-01-31 DIAGNOSIS — B354 Tinea corporis: Secondary | ICD-10-CM

## 2021-01-31 MED ORDER — CLOTRIMAZOLE-BETAMETHASONE 1-0.05 % EX CREA: 1.0000 "application " | TOPICAL_CREAM | Freq: Every day | CUTANEOUS | 1 refills | Status: DC

## 2021-01-31 NOTE — Progress Notes (Signed)
   Subjective:    Patient ID: [name] [name], female    DOB: 04-Dec-1970, 50 y.o.   MRN: [id number]   Chief Complaint: Recheck shingles   HPI Patient was seen on 01/24/21 and was dx with shingles. We gave her valtrex. She stopped the valtrex over the weekend because she was sick and thought was coming from medication. Work will not let her come kack to work until she is released to return. Rash is unchanged. Still itches but doe snot hurt.   Review of Systems  Constitutional:  Negative for diaphoresis.  Eyes:  Negative for pain.  Respiratory:  Negative for shortness of breath.   Cardiovascular:  Negative for chest pain, palpitations and leg swelling.  Gastrointestinal:  Negative for abdominal pain.  Endocrine: Negative for polydipsia.  Skin:  Negative for rash.  Neurological:  Negative for dizziness, weakness and headaches.  Hematological:  Does not bruise/bleed easily.  All other systems reviewed and are negative.     Objective:   Physical Exam Vitals and nursing note reviewed.  Constitutional:      Appearance: Normal appearance.  Cardiovascular:     Rate and Rhythm: Normal rate and regular rhythm.     Heart sounds: Normal heart sounds.  Pulmonary:     Breath sounds: Normal breath sounds.  Skin:    General: Skin is warm.     Findings: Rash (erythematous macular lesion lesions with central clear on left mid back.) present.  Neurological:     General: No focal deficit present.     Mental Status: [name] alert and oriented to person, place, and time.  Psychiatric:        Mood and Affect: Mood normal.        Behavior: Behavior normal.    BP 109/72   Pulse 71   Temp (!) 96.6 F (35.9 C) (Temporal)   Resp 20   Ht 5\' 6"  (1.676 m)   Wt 244 lb (110.7 kg)   SpO2 97%   BMI 39.38 kg/m        Assessment & Plan:  [name] [name] in today with chief complaint of Recheck shingles   1. Ringworm of body Avoid scratching  Cool compresses - clotrimazole-betamethasone  (LOTRISONE) cream; Apply 1 application topically daily.  Dispense: 45 g; Refill: 1    The above assessment and management plan was discussed with the patient. The patient verbalized understanding of and has agreed to the management plan. Patient is aware to call the clinic if symptoms persist or worsen. Patient is aware when to return to the clinic for a follow-up visit. Patient educated on when it is appropriate to go to the emergency department.   Mary-Margaret Hassell Done, FNP

## 2021-03-02 ENCOUNTER — Other Ambulatory Visit: Payer: Self-pay | Admitting: Nurse Practitioner

## 2021-03-02 DIAGNOSIS — M545 Low back pain, unspecified: Secondary | ICD-10-CM

## 2021-03-30 ENCOUNTER — Other Ambulatory Visit: Payer: Self-pay | Admitting: Nurse Practitioner

## 2021-03-30 DIAGNOSIS — M545 Low back pain, unspecified: Secondary | ICD-10-CM

## 2021-05-01 ENCOUNTER — Other Ambulatory Visit: Payer: Self-pay | Admitting: Nurse Practitioner

## 2021-05-01 DIAGNOSIS — I471 Supraventricular tachycardia: Secondary | ICD-10-CM

## 2021-05-01 DIAGNOSIS — G43901 Migraine, unspecified, not intractable, with status migrainosus: Secondary | ICD-10-CM

## 2021-05-01 DIAGNOSIS — K219 Gastro-esophageal reflux disease without esophagitis: Secondary | ICD-10-CM

## 2021-05-01 NOTE — Telephone Encounter (Signed)
Last office visit 11/03/20 Last refill of both meds 11/03/20, #90, 1 refill

## 2021-05-15 ENCOUNTER — Encounter: Payer: Self-pay | Admitting: Nurse Practitioner

## 2021-05-15 ENCOUNTER — Ambulatory Visit: Payer: 59 | Admitting: Nurse Practitioner

## 2021-05-15 VITALS — BP 119/82 | HR 58 | Temp 97.5°F | Resp 20 | Ht 66.0 in | Wt 239.0 lb

## 2021-05-15 DIAGNOSIS — I471 Supraventricular tachycardia, unspecified: Secondary | ICD-10-CM

## 2021-05-15 DIAGNOSIS — K219 Gastro-esophageal reflux disease without esophagitis: Secondary | ICD-10-CM

## 2021-05-15 DIAGNOSIS — R3915 Urgency of urination: Secondary | ICD-10-CM

## 2021-05-15 DIAGNOSIS — E039 Hypothyroidism, unspecified: Secondary | ICD-10-CM

## 2021-05-15 DIAGNOSIS — G43901 Migraine, unspecified, not intractable, with status migrainosus: Secondary | ICD-10-CM

## 2021-05-15 DIAGNOSIS — F410 Panic disorder [episodic paroxysmal anxiety] without agoraphobia: Secondary | ICD-10-CM

## 2021-05-15 DIAGNOSIS — G4733 Obstructive sleep apnea (adult) (pediatric): Secondary | ICD-10-CM

## 2021-05-15 LAB — LIPID PANEL

## 2021-05-15 MED ORDER — METOCLOPRAMIDE HCL 10 MG PO TABS: 10.0000 mg | ORAL_TABLET | Freq: Every day | ORAL | 1 refills | Status: DC

## 2021-05-15 MED ORDER — CLONAZEPAM 0.5 MG PO TABS: 0.5000 mg | ORAL_TABLET | Freq: Two times a day (BID) | ORAL | 5 refills | Status: DC | PRN

## 2021-05-15 MED ORDER — OMEPRAZOLE 40 MG PO CPDR: 40.0000 mg | DELAYED_RELEASE_CAPSULE | Freq: Every day | ORAL | 1 refills | Status: DC

## 2021-05-15 MED ORDER — OXYBUTYNIN CHLORIDE ER 5 MG PO TB24: 5.0000 mg | ORAL_TABLET | Freq: Every day | ORAL | 1 refills | Status: DC

## 2021-05-15 MED ORDER — PROPRANOLOL HCL 80 MG PO TABS: 80.0000 mg | ORAL_TABLET | Freq: Every day | ORAL | 1 refills | Status: DC

## 2021-05-15 MED ORDER — LEVOTHYROXINE SODIUM 100 MCG PO TABS: 100.0000 ug | ORAL_TABLET | Freq: Every day | ORAL | 1 refills | Status: DC

## 2021-05-15 MED ORDER — PANTOPRAZOLE SODIUM 40 MG PO TBEC: 40.0000 mg | DELAYED_RELEASE_TABLET | Freq: Two times a day (BID) | ORAL | 1 refills | Status: DC

## 2021-05-15 NOTE — Progress Notes (Signed)
Subjective:    Patient ID: [name] [name], female    DOB: November 03, 1970, 51 y.o.   MRN: [id number]   Chief Complaint: medical management of chronic issues     HPI:  [name] [name] is a 51 y.o. who identifies as a female who was assigned female at birth.   Social history: Lives with: miom  Work history: works at [employer] and [employer] in today for follow up of the following chronic medical issues:  1. Acquired hypothyroidism No problems that she is aware of. Lab Results  Component Value Date   TSH 3.090 11/03/2020     2. SVT (supraventricular tachycardia) (HCC) No recent episodes  3. Migraine with status migrainosus, not intractable, unspecified migraine type Is on propranlol and has not had any migraines since she started taking.  4. Gastroesophageal reflux disease without esophagitis Is on reglan prilosec and protonix. Still has occassional symptoms.  5. OSA (obstructive sleep apnea) Wears CPAP nightly  6. Urinary urgency Is on ditropan and works well for her symptoms  7. Panic attacks Is on klonopin bid. Hyas a very anxious personaity. Worries about everything. GAD 7 : Generalized Anxiety Score 05/15/2021 11/03/2020  Nervous, Anxious, on Edge 0 0  Control/stop worrying 0 0  Worry too much - different things 0 0  Trouble relaxing 0 0  Restless 0 0  Easily annoyed or irritable 0 2  Afraid - awful might happen 0 0  Total GAD 7 Score 0 2  Anxiety Difficulty Not difficult at all Somewhat difficult      8. Morbid obesity (HCC) Was on a diet at last visit. She has been working out 5 days a week. Wt Readings from Last 3 Encounters:  05/15/21 239 lb (108.4 kg)  01/31/21 244 lb (110.7 kg)  01/24/21 244 lb (110.7 kg)   BMI Readings from Last 3 Encounters:  05/15/21 38.58 kg/m  01/31/21 39.38 kg/m  01/24/21 39.38 kg/m       New complaints: None today  No Known Allergies Outpatient Encounter Medications as of 05/15/2021  Medication Sig   Barberry-Oreg  Grape-Goldenseal (BERBERINE COMPLEX PO) Take 1 tablet by mouth in the morning and at bedtime. For diabetes   cholecalciferol (VITAMIN D3) 25 MCG (1000 UNIT) tablet Take 2,000 Units by mouth daily.   clonazePAM (KLONOPIN) 0.5 MG tablet Take 1 tablet (0.5 mg total) by mouth 2 (two) times daily as needed.   clotrimazole-betamethasone (LOTRISONE) cream Apply 1 application topically daily.   cyclobenzaprine (FLEXERIL) 10 MG tablet TAKE 2 TABLETS BY MOUTH AT BEDTIME   fexofenadine (ALLEGRA) 180 MG tablet Take 180 mg by mouth daily.   levothyroxine (SYNTHROID) 100 MCG tablet Take 1 tablet (100 mcg total) by mouth daily.   liothyronine (CYTOMEL) 5 MCG tablet Take 1 tablet (5 mcg total) by mouth daily.   magnesium oxide (MAG-OX) 400 MG tablet Take 400 mg by mouth 2 (two) times daily.   medroxyPROGESTERone (DEPO-PROVERA) 150 MG/ML injection Inject 150 mg into the muscle every 3 (three) months.   metoCLOPramide (REGLAN) 10 MG tablet TAKE 1 TABLET BY MOUTH EVERY DAY   mometasone (NASONEX) 50 MCG/ACT nasal spray INSTILL 2 SPRAYS IN EACH NOSTRIL EVERY DAY   naproxen (NAPROSYN) 500 MG tablet Take 1 tablet (500 mg total) by mouth daily as needed for mild pain.   omeprazole (PRILOSEC) 40 MG capsule Take 1 capsule (40 mg total) by mouth daily.   oxybutynin (DITROPAN-XL) 5 MG 24 hr tablet Take 1 tablet (5 mg total) by  mouth at bedtime.   OZEMPIC, 0.25 OR 0.5 MG/DOSE, 2 MG/1.5ML SOPN Inject into the skin once a week.   pantoprazole (PROTONIX) 40 MG tablet Take 1 tablet (40 mg total) by mouth 2 (two) times daily.   Prasterone, DHEA, (DHEA 50 PO) Take 1 tablet by mouth daily at 6 (six) AM.   propranolol (INDERAL) 80 MG tablet TAKE 1 TABLET BY MOUTH EVERY DAY   valACYclovir (VALTREX) 1000 MG tablet Take 1 tablet (1,000 mg total) by mouth 3 (three) times daily.   No facility-administered encounter medications on file as of 05/15/2021.    Past Surgical History:  Procedure Laterality Date   CESAREAN SECTION     1194    COLONOSCOPY     KNEE ARTHROSCOPY     LITHOTRIPSY  12/2014   ROTATOR CUFF REPAIR Right 2021    Family History  Problem Relation Age of Onset   Hypertension Mother    Lung cancer Father        died from   Heart disease Maternal Grandmother    Heart disease Maternal Grandfather    Breast cancer Paternal Grandmother        paternal aunts x2   Colon cancer Neg Hx    Esophageal cancer Neg Hx    Rectal cancer Neg Hx    Stomach cancer Neg Hx    Colon polyps Neg Hx       Controlled substance contract: 05/15/21      Review of Systems  Constitutional:  Negative for diaphoresis.  Eyes:  Negative for pain.  Respiratory:  Negative for shortness of breath.   Cardiovascular:  Negative for chest pain, palpitations and leg swelling.  Gastrointestinal:  Negative for abdominal pain.  Endocrine: Negative for polydipsia.  Skin:  Negative for rash.  Neurological:  Negative for dizziness, weakness and headaches.  Hematological:  Does not bruise/bleed easily.  All other systems reviewed and are negative.     Objective:   Physical Exam Vitals and nursing note reviewed.  Constitutional:      General: [name] not in acute distress.    Appearance: Normal appearance. [name] well-developed.  HENT:     Head: Normocephalic.     Right Ear: Tympanic membrane normal.     Left Ear: Tympanic membrane normal.     Nose: Nose normal.     Mouth/Throat:     Mouth: Mucous membranes are moist.  Eyes:     Pupils: Pupils are equal, round, and reactive to light.  Neck:     Vascular: No carotid bruit or JVD.  Cardiovascular:     Rate and Rhythm: Normal rate and regular rhythm.     Heart sounds: Normal heart sounds.  Pulmonary:     Effort: Pulmonary effort is normal. No respiratory distress.     Breath sounds: Normal breath sounds. No wheezing or rales.  Chest:     Chest wall: No tenderness.  Abdominal:     General: Bowel sounds are normal. There is no distension or abdominal bruit.      Palpations: Abdomen is soft. There is no hepatomegaly, splenomegaly, mass or pulsatile mass.     Tenderness: There is no abdominal tenderness.  Musculoskeletal:        General: Normal range of motion.     Cervical back: Normal range of motion and neck supple.  Lymphadenopathy:     Cervical: No cervical adenopathy.  Skin:    General: Skin is warm and dry.  Neurological:  Mental Status: [name] alert and oriented to person, place, and time.     Deep Tendon Reflexes: Reflexes are normal and symmetric.  Psychiatric:        Behavior: Behavior normal.        Thought Content: Thought content normal.        Judgment: Judgment normal.    BP 119/82    Pulse (!) 58    Temp (!) 97.5 F (36.4 C) (Temporal)    Resp 20    Ht $R'5\' 6"'ti$  (1.676 m)    Wt 239 lb (108.4 kg)    SpO2 97%    BMI 38.58 kg/m        Assessment & Plan:  [name] [name] comes in today with chief complaint of Medical Management of Chronic Issues (Left leg pain/)   Diagnosis and orders addressed:  1. Acquired hypothyroidism Labs pending - CBC with Differential/Platelet - CMP14+EGFR - Lipid panel - Thyroid Panel With TSH - levothyroxine (SYNTHROID) 100 MCG tablet; Take 1 tablet (100 mcg total) by mouth daily.  Dispense: 90 tablet; Refill: 1  2. SVT (supraventricular tachycardia) (HCC) Avoid caffeine  3. Migraine with status migrainosus, not intractable, unspecified migraine type  - propranolol (INDERAL) 80 MG tablet; Take 1 tablet (80 mg total) by mouth daily.  Dispense: 90 tablet; Refill: 1  4. Gastroesophageal reflux disease without esophagitis Avoid spicy foods Do not eat 2 hours prior to bedtime - metoCLOPramide (REGLAN) 10 MG tablet; Take 1 tablet (10 mg total) by mouth daily.  Dispense: 90 tablet; Refill: 1 - omeprazole (PRILOSEC) 40 MG capsule; Take 1 capsule (40 mg total) by mouth daily.  Dispense: 90 capsule; Refill: 1 - pantoprazole (PROTONIX) 40 MG tablet; Take 1 tablet (40 mg total) by mouth 2 (two) times  daily.  Dispense: 180 tablet; Refill: 1  5. OSA (obstructive sleep apnea) Continue to wear cpap nightly  6. Urinary urgency Continue detrol as prescribed - oxybutynin (DITROPAN-XL) 5 MG 24 hr tablet; Take 1 tablet (5 mg total) by mouth at bedtime.  Dispense: 90 tablet; Refill: 1  7. Panic attacks Continue klonopin as prescribed- clonazePAM (KLONOPIN) 0.5 MG tablet; Take 1 tablet (0.5 mg total) by mouth 2 (two) times daily as needed.  Dispense: 60 tablet; Refill: 5   8. Morbid obesity ([place]) Continue diet and exercise    Labs pending Health Maintenance reviewed Diet and exercise encouraged  Follow up plan: 6 months   [place], FNP

## 2021-05-15 NOTE — Patient Instructions (Signed)

## 2021-05-16 LAB — CBC WITH DIFFERENTIAL/PLATELET
Basophils Absolute: 0 10*3/uL (ref 0.0–0.2)
Basos: 0 %
EOS (ABSOLUTE): 0.2 10*3/uL (ref 0.0–0.4)
Eos: 2 %
Hematocrit: 45.7 % (ref 34.0–46.6)
Hemoglobin: 14.9 g/dL (ref 11.1–15.9)
Immature Grans (Abs): 0 10*3/uL (ref 0.0–0.1)
Immature Granulocytes: 0 %
Lymphocytes Absolute: 2.2 10*3/uL (ref 0.7–3.1)
Lymphs: 25 %
MCH: 28.2 pg (ref 26.6–33.0)
MCHC: 32.6 g/dL (ref 31.5–35.7)
MCV: 86 fL (ref 79–97)
Monocytes Absolute: 0.7 10*3/uL (ref 0.1–0.9)
Monocytes: 8 %
Neutrophils Absolute: 5.8 10*3/uL (ref 1.4–7.0)
Neutrophils: 65 %
Platelets: 283 10*3/uL (ref 150–450)
RBC: 5.29 x10E6/uL — ABNORMAL HIGH (ref 3.77–5.28)
RDW: 12.9 % (ref 11.7–15.4)
WBC: 8.9 10*3/uL (ref 3.4–10.8)

## 2021-05-16 LAB — CMP14+EGFR
ALT: 18 IU/L (ref 0–32)
AST: 18 IU/L (ref 0–40)
Albumin/Globulin Ratio: 1.6 (ref 1.2–2.2)
Albumin: 4.4 g/dL (ref 3.8–4.8)
Alkaline Phosphatase: 83 IU/L (ref 44–121)
BUN/Creatinine Ratio: 20 (ref 9–23)
BUN: 17 mg/dL (ref 6–24)
Bilirubin Total: 0.3 mg/dL (ref 0.0–1.2)
CO2: 21 mmol/L (ref 20–29)
Calcium: 9.9 mg/dL (ref 8.7–10.2)
Chloride: 108 mmol/L — ABNORMAL HIGH (ref 96–106)
Creatinine, Ser: 0.85 mg/dL (ref 0.57–1.00)
Globulin, Total: 2.7 g/dL (ref 1.5–4.5)
Glucose: 82 mg/dL (ref 70–99)
Potassium: 4.6 mmol/L (ref 3.5–5.2)
Sodium: 144 mmol/L (ref 134–144)
Total Protein: 7.1 g/dL (ref 6.0–8.5)
eGFR: 84 mL/min/{1.73_m2} (ref >59)

## 2021-05-16 LAB — THYROID PANEL WITH TSH
Free Thyroxine Index: 1.2 (ref 1.2–4.9)
T3 Uptake Ratio: 22 % — ABNORMAL LOW (ref 24–39)
T4, Total: 5.5 ug/dL (ref 4.5–12.0)
TSH: 0.285 u[IU]/mL — ABNORMAL LOW (ref 0.450–4.500)

## 2021-05-16 LAB — LIPID PANEL
Chol/HDL Ratio: 3.3 ratio (ref 0.0–4.4)
Cholesterol, Total: 121 mg/dL (ref 100–199)
HDL: 37 mg/dL — ABNORMAL LOW (ref >39)
LDL Chol Calc (NIH): 63 mg/dL (ref 0–99)
Triglycerides: 113 mg/dL (ref 0–149)
VLDL Cholesterol Cal: 21 mg/dL (ref 5–40)

## 2021-05-16 MED ORDER — LEVOTHYROXINE SODIUM 88 MCG PO TABS
88.0000 ug | ORAL_TABLET | Freq: Every day | ORAL | 1 refills | Status: DC
Start: 2021-05-16 — End: 2021-10-24

## 2021-05-16 NOTE — Addendum Note (Signed)
Addended by: Chevis Pretty on: 05/16/2021 07:44 AM ? ? Modules accepted: Orders ? ?

## 2021-05-17 ENCOUNTER — Ambulatory Visit: Payer: Self-pay

## 2021-05-17 ENCOUNTER — Ambulatory Visit (INDEPENDENT_AMBULATORY_CARE_PROVIDER_SITE_OTHER): Payer: 59 | Admitting: Orthopaedic Surgery

## 2021-05-17 ENCOUNTER — Encounter: Payer: Self-pay | Admitting: Orthopaedic Surgery

## 2021-05-17 ENCOUNTER — Other Ambulatory Visit: Payer: Self-pay

## 2021-05-17 VITALS — Ht 66.0 in | Wt 235.0 lb

## 2021-05-17 DIAGNOSIS — G8929 Other chronic pain: Secondary | ICD-10-CM | POA: Diagnosis not present

## 2021-05-17 DIAGNOSIS — M25562 Pain in left knee: Secondary | ICD-10-CM | POA: Insufficient documentation

## 2021-05-17 MED ORDER — METHYLPREDNISOLONE ACETATE 40 MG/ML IJ SUSP
80.0000 mg | INTRAMUSCULAR | Status: AC | PRN
  Administered 2021-05-17: 80 mg via INTRA_ARTICULAR

## 2021-05-17 MED ORDER — LIDOCAINE HCL 1 % IJ SOLN
2.0000 mL | INTRAMUSCULAR | Status: AC | PRN
  Administered 2021-05-17: 2 mL

## 2021-05-17 NOTE — Progress Notes (Signed)
? ?Office Visit Note ?  ?Patient: [name] [name]           ?Date of Birth: 1970-11-24           ?MRN: [id number] ?Visit Date: 05/17/2021 ?             ?Requested by: Chevis Pretty, FNP ?[address] ?[place],  [place] [address] ?PCP: Chevis Pretty, FNP ? ? ?Assessment & Plan: ?Visit Diagnoses:  ?1. Chronic pain of left knee   ? ? ?Plan: Pleasant 51 year old woman with a chief complaint of left anterior knee pain.  [name] status post left knee arthroscopy in 2019 by Dr. Durward Fortes.  At the time she did have some patellofemoral fraying as well as medial compartment fraying of the meniscus.  She has been doing very well since until October.  At that time she began working on a weight loss and exercise program and is currently lost 38 pounds.  She has noticed that she is having pain in the left knee feels like it catches she has been using Aleve and icing it.  She notices a catching when she is going up and down stairs.  Also when she is using the incline on the treadmill.  She denies any instability.  Findings consistent with chondromalacia of patella.  We talked about the natural history of this.  We recommended an injection today and also to meet with physical therapy so she can learn some ways of doing some quadricep strengthening without further aggravating the patellofemoral joint she is in agreement to this plan ? ?Follow-Up Instructions: No follow-ups on file.  ? ?Orders:  ?Orders Placed This Encounter  ?Procedures  ? XR KNEE 3 VIEW LEFT  ? ?No orders of the defined types were placed in this encounter. ? ? ? ? Procedures: ?Large Joint Inj: L knee on 05/17/2021 2:50 PM ?Indications: pain and diagnostic evaluation ?Details: 25 G 1.5 in needle, superior approach ? ?Arthrogram: No ? ?Medications: 80 mg methylPREDNISolone acetate 40 MG/ML; 2 mL lidocaine 1 % ?Outcome: tolerated well, no immediate complications ?Procedure, treatment alternatives, risks and benefits explained, specific risks discussed.  Consent was given by the patient.  ? ? ? ? ?Clinical Data: ?No additional findings. ? ? ?Subjective: ?Chief Complaint  ?Patient presents with  ? Left Knee - Pain  ?Patient presents today for left knee pain. She said that it has been hurting for awhile. Most of the pain is lateral. It has been worsening and now the pain radiates up and down from her knee. She said that stairs make it worse. She feels like it "catches". She takes Aleve and ices as needed. She has a history of a left knee arthroscopy with Dr.Whitfield. ? ? ? ?Review of Systems  ?All other systems reviewed and are negative. ? ? ?Objective: ?Vital Signs: There were no vitals taken for this visit. ? ?Physical Exam ?Constitutional:   ?   Appearance: Normal appearance.  ?Pulmonary:  ?   Effort: Pulmonary effort is normal.  ?Skin: ?   General: Skin is warm and dry.  ?Neurological:  ?   General: No focal deficit present.  ?   Mental Status: [name] alert and oriented to person, place, and time.  ?Psychiatric:     ?   Mood and Affect: Mood normal.  ? ? ?Ortho Exam ?Examination of her left knee: No effusion no erythema no cellulitis.  She has good varus valgus stability.  Good endpoint on anterior draw.  She does have good  strength with holding her leg straight actively.  She does have grinding and pain with range of motion of her knee anteriorly ?Specialty Comments:  ?No specialty comments available. ? ?Imaging: ?No results found. ? ? ?PMFS History: ?Patient Active Problem List  ? Diagnosis Date Noted  ? Chronic rhinitis 02/01/2020  ? S/P right rotator cuff repair 04/16/2019  ? Gastroesophageal reflux disease without esophagitis 07/21/2018  ? Urinary urgency 07/21/2018  ? Hidradenitis suppurativa 10/01/2016  ? OSA (obstructive sleep apnea) 05/13/2015  ? Morbid obesity ([place]) 05/13/2015  ? Hypothyroidism 03/27/2014  ? Panic attacks 03/27/2014  ? SVT (supraventricular tachycardia) ([place]) 06/26/2012  ? Migraines 06/26/2012  ? ?Past Medical History:  ?Diagnosis Date   ? Allergy   ? SEASONAL  ? Anxiety   ? Chronic headaches   ? Colon polyps   ? GERD (gastroesophageal reflux disease)   ? History of kidney stones   ? Hyperlipidemia   ? Hypothyroidism   ? Palpitations   ? Sleep apnea   ?  ?Family History  ?Problem Relation Age of Onset  ? Hypertension Mother   ? Lung cancer Father   ?     died from  ? Heart disease Maternal Grandmother   ? Heart disease Maternal Grandfather   ? Breast cancer Paternal Grandmother   ?     paternal aunts x2  ? Colon cancer Neg Hx   ? Esophageal cancer Neg Hx   ? Rectal cancer Neg Hx   ? Stomach cancer Neg Hx   ? Colon polyps Neg Hx   ?  ?Past Surgical History:  ?Procedure Laterality Date  ? CESAREAN SECTION    ? 6812  ? COLONOSCOPY    ? KNEE ARTHROSCOPY    ? LITHOTRIPSY  12/2014  ? ROTATOR CUFF REPAIR Right 2021  ? ?Social History  ? ?Occupational History  ?  Employer: [employer]  ?Tobacco Use  ? Smoking status: Former  ?  Packs/day: 0.50  ?  Years: 10.00  ?  Pack years: 5.00  ?  Types: Cigarettes  ?  Start date: 03/12/2008  ?  Quit date: 04/05/2018  ?  Years since quitting: 3.1  ? Smokeless tobacco: Never  ?Vaping Use  ? Vaping Use: Never used  ?Substance and Sexual Activity  ? Alcohol use: Yes  ?  Alcohol/week: 0.0 standard drinks  ?  Comment: rare  ? Drug use: No  ? Sexual activity: Not on file  ? ? ? ? ? ? ?

## 2021-05-19 NOTE — Addendum Note (Signed)
Addended by: Lendon Collar on: 05/19/2021 02:07 PM ? ? Modules accepted: Orders ? ?

## 2021-05-24 ENCOUNTER — Other Ambulatory Visit: Payer: Self-pay | Admitting: Nurse Practitioner

## 2021-05-24 DIAGNOSIS — B354 Tinea corporis: Secondary | ICD-10-CM

## 2021-05-29 ENCOUNTER — Other Ambulatory Visit: Payer: Self-pay | Admitting: Nurse Practitioner

## 2021-05-29 DIAGNOSIS — M545 Low back pain, unspecified: Secondary | ICD-10-CM

## 2021-05-29 NOTE — Telephone Encounter (Signed)
Last office visit 05/17/21 ?Last refill 03/30/21, #60, 1 refill ?

## 2021-06-01 ENCOUNTER — Ambulatory Visit: Payer: 59 | Admitting: Family Medicine

## 2021-06-01 ENCOUNTER — Encounter: Payer: Self-pay | Admitting: Family Medicine

## 2021-06-01 VITALS — BP 103/64 | HR 64 | Temp 97.5°F | Ht 66.0 in | Wt 239.8 lb

## 2021-06-01 DIAGNOSIS — N3001 Acute cystitis with hematuria: Secondary | ICD-10-CM | POA: Diagnosis not present

## 2021-06-01 DIAGNOSIS — K529 Noninfective gastroenteritis and colitis, unspecified: Secondary | ICD-10-CM

## 2021-06-01 LAB — MICROSCOPIC EXAMINATION: WBC, UA: >30 /hpf — AB (ref 0–5)

## 2021-06-01 LAB — URINALYSIS, ROUTINE W REFLEX MICROSCOPIC
Bilirubin, UA: NEGATIVE
Glucose, UA: NEGATIVE
Ketones, UA: NEGATIVE
Nitrite, UA: NEGATIVE
Protein,UA: NEGATIVE
Specific Gravity, UA: 1.01 (ref 1.005–1.030)
Urobilinogen, Ur: 1 mg/dL (ref 0.2–1.0)
pH, UA: 7 (ref 5.0–7.5)

## 2021-06-01 NOTE — Progress Notes (Signed)
? ?Assessment & Plan:  ?1. Acute cystitis with hematuria ?Encouraged to start taking Cipro as prescribed. ?- Urinalysis, Routine w reflex microscopic ?- Urine Culture ? ?2. Gastroenteritis ?Education provided on a bland diet. Continue Zofran as needed. Note provided for work. ? ? ?Follow up plan: Return if symptoms worsen or fail to improve. ? ?Hendricks Limes, MSN, APRN, FNP-C ?[place] ? ?Subjective:  ? ?Patient ID: [name] [name], female    DOB: 1970-11-22, 51 y.o.   MRN: [id number] ? ?HPI: ?[name] [name] is a 51 y.o. female presenting on 06/01/2021 for ER follow up (3/21[facility]  Patient states that she is still fatigue and has a loss of appetite. ) ? ?Patient was seen at [facility] ER on 05/30/2021 at which time she was diagnosed with a UTI and gastroenteritis. She was discharged with Cipro x10 days. [name] here today with concerns that she is still fatigued and does not have an appetite. She would like urine re-checked as she does not feel it was an appropriate diagnosis. She has only had two doses of the Cipro due to her stomach issues. She had diarrhea all day yesterday but has not had any today. She was able to tolerate oatmeal this morning. ? ? ?ROS: Negative unless specifically indicated above in HPI.  ? ?Relevant past medical history reviewed and updated as indicated.  ? ?Allergies and medications reviewed and updated. ? ? ?Current Outpatient Medications:  ?  Barberry-Oreg Grape-Goldenseal (BERBERINE COMPLEX PO), Take 1 tablet by mouth in the morning and at bedtime. For diabetes, Disp: , Rfl:  ?  cholecalciferol (VITAMIN D3) 25 MCG (1000 UNIT) tablet, Take 2,000 Units by mouth daily., Disp: , Rfl:  ?  ciprofloxacin (CIPRO) 500 MG tablet, Take 500 mg by mouth 2 (two) times daily., Disp: , Rfl:  ?  clonazePAM (KLONOPIN) 0.5 MG tablet, Take 1 tablet (0.5 mg total) by mouth 2 (two) times daily as needed., Disp: 60 tablet, Rfl: 5 ?  clotrimazole-betamethasone (LOTRISONE) cream,  APPLY TO THE AFFECTED AREA(S) topically DAILY, Disp: 45 g, Rfl: 1 ?  cyclobenzaprine (FLEXERIL) 10 MG tablet, TAKE 2 TABLETS BY MOUTH AT BEDTIME, Disp: 60 tablet, Rfl: 1 ?  fexofenadine (ALLEGRA) 180 MG tablet, Take 180 mg by mouth daily., Disp: , Rfl:  ?  levothyroxine (SYNTHROID) 88 MCG tablet, Take 1 tablet (88 mcg total) by mouth daily., Disp: 90 tablet, Rfl: 1 ?  liothyronine (CYTOMEL) 5 MCG tablet, Take 1 tablet (5 mcg total) by mouth daily., Disp: 90 tablet, Rfl: 1 ?  magnesium oxide (MAG-OX) 400 MG tablet, Take 400 mg by mouth 2 (two) times daily., Disp: , Rfl:  ?  metoCLOPramide (REGLAN) 10 MG tablet, Take 1 tablet (10 mg total) by mouth daily., Disp: 90 tablet, Rfl: 1 ?  mometasone (NASONEX) 50 MCG/ACT nasal spray, INSTILL 2 SPRAYS IN EACH NOSTRIL EVERY DAY, Disp: 17 g, Rfl: 12 ?  naproxen (NAPROSYN) 500 MG tablet, Take 1 tablet (500 mg total) by mouth daily as needed for mild pain., Disp: 30 tablet, Rfl: 0 ?  omeprazole (PRILOSEC) 40 MG capsule, Take 1 capsule (40 mg total) by mouth daily., Disp: 90 capsule, Rfl: 1 ?  ondansetron (ZOFRAN-ODT) 4 MG disintegrating tablet, Take by mouth., Disp: , Rfl:  ?  oxybutynin (DITROPAN-XL) 5 MG 24 hr tablet, Take 1 tablet (5 mg total) by mouth at bedtime., Disp: 90 tablet, Rfl: 1 ?  OZEMPIC, 0.25 OR 0.5 MG/DOSE, 2 MG/1.5ML SOPN, Inject into the skin once a  week., Disp: , Rfl:  ?  pantoprazole (PROTONIX) 40 MG tablet, Take 1 tablet (40 mg total) by mouth 2 (two) times daily., Disp: 180 tablet, Rfl: 1 ?  Prasterone, DHEA, (DHEA 50 PO), Take 1 tablet by mouth daily at 6 (six) AM., Disp: , Rfl:  ?  progesterone (PROMETRIUM) 200 MG capsule, Take 200 mg by mouth at bedtime., Disp: , Rfl:  ?  propranolol (INDERAL) 80 MG tablet, Take 1 tablet (80 mg total) by mouth daily., Disp: 90 tablet, Rfl: 1 ?  valACYclovir (VALTREX) 1000 MG tablet, Take 1 tablet (1,000 mg total) by mouth 3 (three) times daily., Disp: 21 tablet, Rfl: 0 ? ?Allergies  ?Allergen Reactions  ?  Chlorpheniramine Other (See Comments)  ? Pseudoephedrine Hcl Other (See Comments)  ? ? ?Objective:  ? ?BP 103/64   Pulse 64   Temp (!) 97.5 ?F (36.4 ?C) (Temporal)   Ht '5\' 6"'$  (1.676 m)   Wt 239 lb 12.8 oz (108.8 kg)   SpO2 98%   BMI 38.70 kg/m?   ? ?Physical Exam ?Vitals reviewed.  ?Constitutional:   ?   General: [name] not in acute distress. ?   Appearance: Normal appearance. [name] not ill-appearing, toxic-appearing or diaphoretic.  ?HENT:  ?   Head: Normocephalic and atraumatic.  ?Eyes:  ?   General: No scleral icterus.    ?   Right eye: No discharge.     ?   Left eye: No discharge.  ?   Conjunctiva/sclera: Conjunctivae normal.  ?Cardiovascular:  ?   Rate and Rhythm: Normal rate.  ?Pulmonary:  ?   Effort: Pulmonary effort is normal. No respiratory distress.  ?Abdominal:  ?   General: Abdomen is flat. Bowel sounds are normal. There is no distension.  ?   Palpations: Abdomen is soft.  ?   Tenderness: There is abdominal tenderness. There is no guarding or rebound.  ?   Hernia: No hernia is present.  ?Musculoskeletal:     ?   General: Normal range of motion.  ?   Cervical back: Normal range of motion.  ?Skin: ?   General: Skin is warm and dry.  ?   Capillary Refill: Capillary refill takes less than 2 seconds.  ?Neurological:  ?   General: No focal deficit present.  ?   Mental Status: [name] alert and oriented to person, place, and time. Mental status is at baseline.  ?Psychiatric:     ?   Mood and Affect: Mood normal.     ?   Behavior: Behavior normal.     ?   Thought Content: Thought content normal.     ?   Judgment: Judgment normal.  ? ? ? ? ? ? ?

## 2021-06-04 LAB — URINE CULTURE

## 2021-07-27 ENCOUNTER — Other Ambulatory Visit: Payer: Self-pay | Admitting: Nurse Practitioner

## 2021-07-27 DIAGNOSIS — M545 Low back pain, unspecified: Secondary | ICD-10-CM

## 2021-09-14 ENCOUNTER — Encounter: Payer: Self-pay | Admitting: *Deleted

## 2021-09-25 ENCOUNTER — Other Ambulatory Visit: Payer: Self-pay | Admitting: Nurse Practitioner

## 2021-09-25 DIAGNOSIS — M545 Low back pain, unspecified: Secondary | ICD-10-CM

## 2021-09-25 NOTE — Telephone Encounter (Signed)
Last OV 06/01/21 acute 05/15/21 chronic care. Last RF 07/27/21 #60 1rf. Next OV 11/24/21

## 2021-10-24 ENCOUNTER — Other Ambulatory Visit: Payer: Self-pay | Admitting: Nurse Practitioner

## 2021-10-24 DIAGNOSIS — F410 Panic disorder [episodic paroxysmal anxiety] without agoraphobia: Secondary | ICD-10-CM

## 2021-11-16 ENCOUNTER — Ambulatory Visit: Payer: 59 | Admitting: Nurse Practitioner

## 2021-11-22 ENCOUNTER — Other Ambulatory Visit: Payer: Self-pay | Admitting: Nurse Practitioner

## 2021-11-22 DIAGNOSIS — M545 Low back pain, unspecified: Secondary | ICD-10-CM

## 2021-11-22 DIAGNOSIS — R3915 Urgency of urination: Secondary | ICD-10-CM

## 2021-11-22 DIAGNOSIS — K219 Gastro-esophageal reflux disease without esophagitis: Secondary | ICD-10-CM

## 2021-11-24 ENCOUNTER — Encounter: Payer: Self-pay | Admitting: Nurse Practitioner

## 2021-11-24 ENCOUNTER — Ambulatory Visit: Payer: 59 | Admitting: Nurse Practitioner

## 2021-11-24 VITALS — BP 128/74 | HR 63 | Temp 96.5°F | Resp 20 | Ht 66.0 in | Wt 260.0 lb

## 2021-11-24 DIAGNOSIS — E039 Hypothyroidism, unspecified: Secondary | ICD-10-CM

## 2021-11-24 DIAGNOSIS — K219 Gastro-esophageal reflux disease without esophagitis: Secondary | ICD-10-CM

## 2021-11-24 DIAGNOSIS — Z23 Encounter for immunization: Secondary | ICD-10-CM | POA: Diagnosis not present

## 2021-11-24 DIAGNOSIS — G43901 Migraine, unspecified, not intractable, with status migrainosus: Secondary | ICD-10-CM | POA: Diagnosis not present

## 2021-11-24 DIAGNOSIS — R3915 Urgency of urination: Secondary | ICD-10-CM

## 2021-11-24 DIAGNOSIS — G4733 Obstructive sleep apnea (adult) (pediatric): Secondary | ICD-10-CM

## 2021-11-24 DIAGNOSIS — J310 Chronic rhinitis: Secondary | ICD-10-CM | POA: Diagnosis not present

## 2021-11-24 DIAGNOSIS — R300 Dysuria: Secondary | ICD-10-CM

## 2021-11-24 DIAGNOSIS — I471 Supraventricular tachycardia: Secondary | ICD-10-CM

## 2021-11-24 DIAGNOSIS — F410 Panic disorder [episodic paroxysmal anxiety] without agoraphobia: Secondary | ICD-10-CM

## 2021-11-24 LAB — MICROSCOPIC EXAMINATION
RBC, Urine: NONE SEEN /hpf (ref 0–2)
Renal Epithel, UA: NONE SEEN /hpf

## 2021-11-24 LAB — URINALYSIS, COMPLETE
Bilirubin, UA: NEGATIVE
Glucose, UA: NEGATIVE
Ketones, UA: NEGATIVE
Leukocytes,UA: NEGATIVE
Nitrite, UA: NEGATIVE
Protein,UA: NEGATIVE
RBC, UA: NEGATIVE
Specific Gravity, UA: 1.015 (ref 1.005–1.030)
Urobilinogen, Ur: 0.2 mg/dL (ref 0.2–1.0)
pH, UA: 6.5 (ref 5.0–7.5)

## 2021-11-24 MED ORDER — CLONAZEPAM 0.5 MG PO TABS: 0.5000 mg | ORAL_TABLET | Freq: Two times a day (BID) | ORAL | 5 refills | Status: DC | PRN

## 2021-11-24 MED ORDER — PANTOPRAZOLE SODIUM 40 MG PO TBEC: 40.0000 mg | DELAYED_RELEASE_TABLET | Freq: Two times a day (BID) | ORAL | 1 refills | Status: DC

## 2021-11-24 MED ORDER — OXYBUTYNIN CHLORIDE ER 5 MG PO TB24: 5.0000 mg | ORAL_TABLET | Freq: Every day | ORAL | 1 refills | Status: DC

## 2021-11-24 MED ORDER — PROPRANOLOL HCL 80 MG PO TABS: 80.0000 mg | ORAL_TABLET | Freq: Every day | ORAL | 1 refills | Status: DC

## 2021-11-24 MED ORDER — MAGNESIUM OXIDE 400 MG PO TABS: 400.0000 mg | ORAL_TABLET | Freq: Two times a day (BID) | ORAL | 1 refills | Status: DC

## 2021-11-24 MED ORDER — LEVOTHYROXINE SODIUM 88 MCG PO TABS: 88.0000 ug | ORAL_TABLET | Freq: Every day | ORAL | 0 refills | Status: DC

## 2021-11-24 NOTE — Progress Notes (Signed)
Subjective:    Patient ID: [name] [name], female    DOB: [birth year], 51 y.o.   MRN: [id number]   Chief Complaint: medical management of chronic issues     HPI:  [name] [name] is a 51 y.o. who identifies as a female who was assigned female at birth.   Social history: Lives with: mom and son Work history: works at [employer] and [employer] in today for follow up of the following chronic medical issues:  1. SVT (supraventricular tachycardia) (HCC) No palpitations or heart racing  2. Migraine with status migrainosus, not intractable, unspecified migraine type Has been doing well on propranlol, and has had no migraines  3. Chronic rhinitis Doing good since she quit smoking  4. OSA (obstructive sleep apnea) Wears cpap nightly  5. Gastroesophageal reflux disease without esophagitis Is on protonix daily which works well to keep symptoms under control  6. Acquired hypothyroidism No problems that she is aware of Lab Results  Component Value Date   TSH 0.285 (L) 05/15/2021    7. Panic attacks Is on klonopin BID    11/24/2021    8:14 AM 05/15/2021    7:59 AM 11/03/2020    4:11 PM  GAD 7 : Generalized Anxiety Score  Nervous, Anxious, on Edge 0 0 0  Control/stop worrying 0 0 0  Worry too much - different things 0 0 0  Trouble relaxing 0 0 0  Restless 0 0 0  Easily annoyed or irritable 0 0 2  Afraid - awful might happen 0 0 0  Total GAD 7 Score 0 0 2  Anxiety Difficulty Not difficult at all Not difficult at all Somewhat difficult      8. Morbid obesity ([place]) No recent weight changes Wt Readings from Last 3 Encounters:  11/24/21 260 lb (117.9 kg)  06/01/21 239 lb 12.8 oz (108.8 kg)  05/17/21 235 lb (106.6 kg)   BMI Readings from Last 3 Encounters:  11/24/21 41.97 kg/m  06/01/21 38.70 kg/m  05/17/21 37.93 kg/m     New complaints: Has been having dysuria for several days  Allergies  Allergen Reactions   Chlorpheniramine Other (See Comments)    Pseudoephedrine Hcl Other (See Comments)   Outpatient Encounter Medications as of 11/24/2021  Medication Sig   Barberry-Oreg Grape-Goldenseal (BERBERINE COMPLEX PO) Take 1 tablet by mouth in the morning and at bedtime. For diabetes   cholecalciferol (VITAMIN D3) 25 MCG (1000 UNIT) tablet Take 2,000 Units by mouth daily.   ciprofloxacin (CIPRO) 500 MG tablet Take 500 mg by mouth 2 (two) times daily.   clonazePAM (KLONOPIN) 0.5 MG tablet Take 1 tablet (0.5 mg total) by mouth 2 (two) times daily as needed.   clotrimazole-betamethasone (LOTRISONE) cream APPLY TO THE AFFECTED AREA(S) topically DAILY   cyclobenzaprine (FLEXERIL) 10 MG tablet TAKE 2 TABLETS BY MOUTH AT BEDTIME   fexofenadine (ALLEGRA) 180 MG tablet Take 180 mg by mouth daily.   levothyroxine (SYNTHROID) 88 MCG tablet TAKE 1 TABLET BY MOUTH DAILY   liothyronine (CYTOMEL) 5 MCG tablet Take 1 tablet (5 mcg total) by mouth daily.   magnesium oxide (MAG-OX) 400 MG tablet Take 400 mg by mouth 2 (two) times daily.   metoCLOPramide (REGLAN) 10 MG tablet Take 1 tablet (10 mg total) by mouth daily.   mometasone (NASONEX) 50 MCG/ACT nasal spray INSTILL 2 SPRAYS IN EACH NOSTRIL EVERY DAY   naproxen (NAPROSYN) 500 MG tablet Take 1 tablet (500 mg total) by mouth daily as needed for mild  pain.   omeprazole (PRILOSEC) 40 MG capsule Take 1 capsule (40 mg total) by mouth daily.   oxybutynin (DITROPAN-XL) 5 MG 24 hr tablet TAKE 1 TABLET BY MOUTH AT BEDTIME   OZEMPIC, 0.25 OR 0.5 MG/DOSE, 2 MG/1.5ML SOPN Inject into the skin once a week.   pantoprazole (PROTONIX) 40 MG tablet TAKE 1 TABLET BY MOUTH TWICE DAILY   Prasterone, DHEA, (DHEA 50 PO) Take 1 tablet by mouth daily at 6 (six) AM.   progesterone (PROMETRIUM) 200 MG capsule Take 200 mg by mouth at bedtime.   propranolol (INDERAL) 80 MG tablet Take 1 tablet (80 mg total) by mouth daily.   valACYclovir (VALTREX) 1000 MG tablet Take 1 tablet (1,000 mg total) by mouth 3 (three) times daily.   No  facility-administered encounter medications on file as of 11/24/2021.    Past Surgical History:  Procedure Laterality Date   CESAREAN SECTION     1194   COLONOSCOPY     KNEE ARTHROSCOPY     LITHOTRIPSY  12/2014   ROTATOR CUFF REPAIR Right 2021    Family History  Problem Relation Age of Onset   Hypertension Mother    Lung cancer Father        died from   Heart disease Maternal Grandmother    Heart disease Maternal Grandfather    Breast cancer Paternal Grandmother        paternal aunts x2   Colon cancer Neg Hx    Esophageal cancer Neg Hx    Rectal cancer Neg Hx    Stomach cancer Neg Hx    Colon polyps Neg Hx       Controlled substance contract: n/a     Review of Systems  Constitutional:  Negative for diaphoresis.  Eyes:  Negative for pain.  Respiratory:  Negative for shortness of breath.   Cardiovascular:  Negative for chest pain, palpitations and leg swelling.  Gastrointestinal:  Negative for abdominal pain.  Endocrine: Negative for polydipsia.  Skin:  Negative for rash.  Neurological:  Negative for dizziness, weakness and headaches.  Hematological:  Does not bruise/bleed easily.  All other systems reviewed and are negative.      Objective:   Physical Exam Vitals and nursing note reviewed.  Constitutional:      General: [name] not in acute distress.    Appearance: Normal appearance. [name] well-developed.  HENT:     Head: Normocephalic.     Right Ear: Tympanic membrane normal.     Left Ear: Tympanic membrane normal.     Nose: Nose normal.     Mouth/Throat:     Mouth: Mucous membranes are moist.  Eyes:     Pupils: Pupils are equal, round, and reactive to light.  Neck:     Vascular: No carotid bruit or JVD.  Cardiovascular:     Rate and Rhythm: Normal rate and regular rhythm.     Heart sounds: Normal heart sounds.  Pulmonary:     Effort: Pulmonary effort is normal. No respiratory distress.     Breath sounds: Normal breath sounds. No wheezing or  rales.  Chest:     Chest wall: No tenderness.  Abdominal:     General: Bowel sounds are normal. There is no distension or abdominal bruit.     Palpations: Abdomen is soft. There is no hepatomegaly, splenomegaly, mass or pulsatile mass.     Tenderness: There is no abdominal tenderness.  Musculoskeletal:        General: Normal range of  motion.     Cervical back: Normal range of motion and neck supple.  Lymphadenopathy:     Cervical: No cervical adenopathy.  Skin:    General: Skin is warm and dry.  Neurological:     Mental Status: [name] alert and oriented to person, place, and time.     Deep Tendon Reflexes: Reflexes are normal and symmetric.  Psychiatric:        Behavior: Behavior normal.        Thought Content: Thought content normal.        Judgment: Judgment normal.     BP 128/74   Pulse 63   Temp (!) 96.5 F (35.8 C) (Temporal)   Resp 20   Ht _0  (1.676 m)   Wt 260 lb (117.9 kg)   SpO2 90%   BMI 41.97 kg/m        Assessment & Plan:  Trudee Chirino comes in today with chief complaint of Medical Management of Chronic Issues (Dysuria/)   Diagnosis and orders addressed:  1. SVT (supraventricular tachycardia) (HCC) Avoid caffeine  2. Migraine with status migrainosus, not intractable, unspecified migraine type - propranolol (INDERAL) 80 MG tablet; Take 1 tablet (80 mg total) by mouth daily.  Dispense: 90 tablet; Refill: 1  3. Chronic rhinitis OTC allergy meds as needed  4. OSA (obstructive sleep apnea) Continue to wear CPAP  5. Gastroesophageal reflux disease without esophagitis Avoid spicy foods Do not eat 2 hours prior to bedtime - pantoprazole (PROTONIX) 40 MG tablet; Take 1 tablet (40 mg total) by mouth 2 (two) times daily.  Dispense: 180 tablet; Refill: 1  6. Acquired hypothyroidism Labs pending - levothyroxine (SYNTHROID) 88 MCG tablet; Take 1 tablet (88 mcg total) by mouth daily.  Dispense: 90 tablet; Refill: 0 - CBC with Differential/Platelet -  CMP14+EGFR - Lipid panel - Thyroid Panel With TSH  7. Panic attacks Stress management - clonazePAM (KLONOPIN) 0.5 MG tablet; Take 1 tablet (0.5 mg total) by mouth 2 (two) times daily as needed.  Dispense: 60 tablet; Refill: 5  8. Morbid obesity ([place]) Discussed diet and exercise for person with BMI >25 Will recheck weight in 3-6 months   9. Dysuria Urine clear - Urinalysis, Complete  10. Urinary urgency - oxybutynin (DITROPAN-XL) 5 MG 24 hr tablet; Take 1 tablet (5 mg total) by mouth at bedtime.  Dispense: 90 tablet; Refill: 1   Labs pending Health Maintenance reviewed Diet and exercise encouraged  Follow up plan: 6 months   Mary-Margaret Hassell Done, FNP

## 2021-11-24 NOTE — Patient Instructions (Signed)

## 2021-11-25 LAB — LIPID PANEL
Chol/HDL Ratio: 3.5 ratio (ref 0.0–4.4)
Cholesterol, Total: 153 mg/dL (ref 100–199)
HDL: 44 mg/dL (ref >39)
LDL Chol Calc (NIH): 81 mg/dL (ref 0–99)
Triglycerides: 162 mg/dL — ABNORMAL HIGH (ref 0–149)
VLDL Cholesterol Cal: 28 mg/dL (ref 5–40)

## 2021-11-25 LAB — CBC WITH DIFFERENTIAL/PLATELET
Basophils Absolute: 0.1 10*3/uL (ref 0.0–0.2)
Basos: 1 %
EOS (ABSOLUTE): 0.2 10*3/uL (ref 0.0–0.4)
Eos: 2 %
Hematocrit: 43.6 % (ref 34.0–46.6)
Hemoglobin: 14.7 g/dL (ref 11.1–15.9)
Immature Grans (Abs): 0.1 10*3/uL (ref 0.0–0.1)
Immature Granulocytes: 1 %
Lymphocytes Absolute: 2.6 10*3/uL (ref 0.7–3.1)
Lymphs: 27 %
MCH: 29.6 pg (ref 26.6–33.0)
MCHC: 33.7 g/dL (ref 31.5–35.7)
MCV: 88 fL (ref 79–97)
Monocytes Absolute: 0.8 10*3/uL (ref 0.1–0.9)
Monocytes: 8 %
Neutrophils Absolute: 6 10*3/uL (ref 1.4–7.0)
Neutrophils: 61 %
Platelets: 326 10*3/uL (ref 150–450)
RBC: 4.96 x10E6/uL (ref 3.77–5.28)
RDW: 13.2 % (ref 11.7–15.4)
WBC: 9.7 10*3/uL (ref 3.4–10.8)

## 2021-11-25 LAB — CMP14+EGFR
ALT: 30 IU/L (ref 0–32)
AST: 32 IU/L (ref 0–40)
Albumin/Globulin Ratio: 1.5 (ref 1.2–2.2)
Albumin: 4.3 g/dL (ref 3.9–4.9)
Alkaline Phosphatase: 67 IU/L (ref 44–121)
BUN/Creatinine Ratio: 17 (ref 9–23)
BUN: 13 mg/dL (ref 6–24)
Bilirubin Total: 0.4 mg/dL (ref 0.0–1.2)
CO2: 23 mmol/L (ref 20–29)
Calcium: 9.9 mg/dL (ref 8.7–10.2)
Chloride: 103 mmol/L (ref 96–106)
Creatinine, Ser: 0.78 mg/dL (ref 0.57–1.00)
Globulin, Total: 2.8 g/dL (ref 1.5–4.5)
Glucose: 108 mg/dL — ABNORMAL HIGH (ref 70–99)
Potassium: 4.7 mmol/L (ref 3.5–5.2)
Sodium: 141 mmol/L (ref 134–144)
Total Protein: 7.1 g/dL (ref 6.0–8.5)
eGFR: 93 mL/min/{1.73_m2} (ref >59)

## 2021-11-25 LAB — THYROID PANEL WITH TSH
Free Thyroxine Index: 1.2 (ref 1.2–4.9)
T3 Uptake Ratio: 21 % — ABNORMAL LOW (ref 24–39)
T4, Total: 5.9 ug/dL (ref 4.5–12.0)
TSH: 1.36 u[IU]/mL (ref 0.450–4.500)

## 2021-12-08 ENCOUNTER — Other Ambulatory Visit: Payer: Self-pay | Admitting: Nurse Practitioner

## 2021-12-08 NOTE — Telephone Encounter (Signed)
Please advise on RF, last RF was on 10/14/20 #90 with 1 RF, last OV notes it is not noted pt is still taking

## 2021-12-08 NOTE — Telephone Encounter (Signed)
  Prescription Request  12/08/2021  Is this a "Controlled Substance" medicine? no  Have you seen your PCP in the last 2 weeks? Yes, 9/15  If YES, route message to pool  -  If NO, patient needs to be scheduled for appointment.  What is the name of the medication or equipment? liothyronine (CYTOMEL) 5 MCG tablet  Have you contacted your pharmacy to request a refill? yes   Which pharmacy would you like this sent to? [facility]   Patient notified that their request is being sent to the clinical staff for review and that they should receive a response within 2 business days.

## 2021-12-10 MED ORDER — LIOTHYRONINE SODIUM 5 MCG PO TABS: 5.0000 ug | ORAL_TABLET | Freq: Every day | ORAL | 0 refills | Status: DC

## 2022-01-22 ENCOUNTER — Other Ambulatory Visit: Payer: Self-pay | Admitting: Nurse Practitioner

## 2022-01-22 DIAGNOSIS — M545 Low back pain, unspecified: Secondary | ICD-10-CM

## 2022-02-27 ENCOUNTER — Telehealth: Payer: 59 | Admitting: Nurse Practitioner

## 2022-02-28 ENCOUNTER — Ambulatory Visit: Payer: 59 | Admitting: Nurse Practitioner

## 2022-02-28 ENCOUNTER — Encounter: Payer: Self-pay | Admitting: Nurse Practitioner

## 2022-02-28 VITALS — BP 128/74 | HR 77 | Ht 66.0 in | Wt 270.8 lb

## 2022-02-28 DIAGNOSIS — J019 Acute sinusitis, unspecified: Secondary | ICD-10-CM

## 2022-02-28 DIAGNOSIS — B9689 Other specified bacterial agents as the cause of diseases classified elsewhere: Secondary | ICD-10-CM | POA: Insufficient documentation

## 2022-02-28 DIAGNOSIS — G4733 Obstructive sleep apnea (adult) (pediatric): Secondary | ICD-10-CM

## 2022-02-28 MED ORDER — AZELASTINE HCL 0.1 % NA SOLN: 2.0000 | Freq: Two times a day (BID) | NASAL | 6 refills | Status: DC

## 2022-02-28 MED ORDER — AMOXICILLIN-POT CLAVULANATE 875-125 MG PO TABS: 1.0000 | ORAL_TABLET | Freq: Two times a day (BID) | ORAL | 0 refills | Status: AC

## 2022-02-28 NOTE — Patient Instructions (Addendum)
Continue to use CPAP every night, minimum of 4-6 hours a night.  Change equipment every 30 days or as directed by DME. Wash your tubing with warm soap and water daily, hang to dry. Wash humidifier portion weekly.  Be aware of reduced alertness and do not drive or operate heavy machinery if experiencing this or drowsiness.  Exercise encouraged, as tolerated. Avoid or decrease alcohol consumption and medications that make you more sleepy, if possible. Notify if persistent daytime sleepiness occurs even with consistent use of CPAP.  Restart astelin nasal spray 2 sprays each nostril Twice daily for nasal congestion Continue nasocort 2 sprays each nostril daily Continue pantoprazole 1 tab daily for reflux Continue allegra daily  Augmentin 1 tab Twice daily for 10 days. Take with food. Take a daily probiotic while taking Use saline nasal rinses one to two times a day then use your nasal sprays about 20-30 minutes later  Guaifenesin [phone] mg Twice daily for nasal congestion  Follow up in one year with Dr. Halford Chessman. If symptoms do not improve or worsen, please contact office for sooner follow up or seek emergency care.

## 2022-02-28 NOTE — Assessment & Plan Note (Signed)
Severe OSA on CPAP. Excellent compliance and control with residual AHI 0.1. No significant leaks. Pressures seem to be set appropriately. We will send orders for new supplies and head strap for her today.   Patient Instructions  Continue to use CPAP every night, minimum of 4-6 hours a night.  Change equipment every 30 days or as directed by DME. Wash your tubing with warm soap and water daily, hang to dry. Wash humidifier portion weekly.  Be aware of reduced alertness and do not drive or operate heavy machinery if experiencing this or drowsiness.  Exercise encouraged, as tolerated. Avoid or decrease alcohol consumption and medications that make you more sleepy, if possible. Notify if persistent daytime sleepiness occurs even with consistent use of CPAP.  Restart astelin nasal spray 2 sprays each nostril Twice daily for nasal congestion Continue nasocort 2 sprays each nostril daily Continue pantoprazole 1 tab daily for reflux Continue allegra daily  Augmentin 1 tab Twice daily for 10 days. Take with food. Take a daily probiotic while taking Use saline nasal rinses one to two times a day then use your nasal sprays about 20-30 minutes later  Guaifenesin [phone] mg Twice daily for nasal congestion  Follow up in one year with Dr. Halford Chessman. If symptoms do not improve or worsen, please contact office for sooner follow up or seek emergency care.

## 2022-02-28 NOTE — Progress Notes (Signed)
$'@Patient'W$  ID: [name] [name], female    DOB: [birth year], 51 y.o.   MRN: [id number]  Chief Complaint  Patient presents with   Follow-up    Pt f/u she needs a new prescription for her machine, also would like a refill for nasal spray    Referring provider: Chevis [name], *  HPI: 51 year old female, former smoker followed for severe OSA on CPAP. [name] a patient of [name] [name] and last seen in office 02/01/2020. Past medical history significant for SVT, migraines, chronic rhinitis, GERD, hypothyroidism, obesity, anxiety/panic attacks.  TEST/EVENTS:  2016 HST: AHI 92.2, SpO2 low 47%  02/01/2020: OV with Mack NP.  Follow-up for sleep apnea.  Excellent compliance with CPAP with good control of events.  Struggles with chronic rhinitis/vasomotor rhinitis.  Symptoms seem to get worse after eating.  Advised her to start on Astelin nasal spray.  Continued on Nasonex.  Can also start saline nasal rinses prior to nasal medications.  Follow-up 1 year.  02/28/2022: Today-follow-up Patient presents today for follow up. She has been doing great with her CPAP since she was here last. No pressure issues or problems with leaks. Wears it nightly without fail. She denies any excessive daytime fatigue, morning headaches, or drowsy driving. She has been having trouble with her sinuses for the past 3-4 weeks. She went to urgent care at the end of November and was treated with a short course of antibiotics. She's not sure what this was but says it was only for around 5 days or so. She did feel slightly better. Unfortunately since completing, her symptoms have returned. She has sinus pressure and feel like she can't breathe through her nose, especially at night. She has purulent drainage and some mild throat irritation from postnasal drip. She denies fevers, chills, headaches, GI symptoms. No interim sick exposures. She uses nasocort daily and takes Human resources officer. She has been evaluated by ENT in the past. Has a deviated  septum but does not want to undergo repair as they told her she'd have to be off her CPAP for 4 nights.   01/29/2022-02/27/2022: CPAP 5-20 cmH2O 30/30 days; 100% >4 hr; av use 9 hr 49 min Pressure 95th 13.4  Leaks 95th 3.2  AHI 0.1   Allergies  Allergen Reactions   Chlorpheniramine Other (See Comments)   Pseudoephedrine Hcl Other (See Comments)    Immunization History  Administered Date(s) Administered   Influenza Inj Mdck Quad Pf 12/11/2018   Influenza Split 12/14/2014, 12/02/2019   Influenza,inj,Quad PF,6+ Mos 12/11/2015, 11/24/2021   Influenza-Unspecified 12/14/2014, 12/19/2016, 12/31/2017   Moderna Sars-Covid-2 Vaccination 05/20/2019, 06/17/2019, 01/07/2020   Pneumococcal Polysaccharide-23 09/17/2009   Tdap 08/24/1997, 10/10/2017    Past Medical History:  Diagnosis Date   Allergy    SEASONAL   Anxiety    Chronic headaches    Colon polyps    GERD (gastroesophageal reflux disease)    History of kidney stones    Hyperlipidemia    Hypothyroidism    Palpitations    Sleep apnea     Tobacco History: Social History   Tobacco Use  Smoking Status Former   Packs/day: 0.50   Years: 10.00   Total pack years: 5.00   Types: Cigarettes   Start date: 03/12/2008   Quit date: 04/05/2018   Years since quitting: 3.9  Smokeless Tobacco Never   Counseling given: Not Answered   Outpatient Medications Prior to Visit  Medication Sig Dispense Refill   cholecalciferol (VITAMIN D3) 25 MCG (1000 UNIT) tablet Take  2,000 Units by mouth daily.     clonazePAM (KLONOPIN) 0.5 MG tablet Take 1 tablet (0.5 mg total) by mouth 2 (two) times daily as needed. 60 tablet 5   cyclobenzaprine (FLEXERIL) 10 MG tablet TAKE 2 TABLETS BY MOUTH AT BEDTIME 60 tablet 3   fexofenadine (ALLEGRA) 180 MG tablet Take 180 mg by mouth daily.     levothyroxine (SYNTHROID) 88 MCG tablet Take 1 tablet (88 mcg total) by mouth daily. 90 tablet 0   liothyronine (CYTOMEL) 5 MCG tablet Take 1 tablet (5 mcg total) by  mouth daily. 90 tablet 0   magnesium oxide (MAG-OX) 400 MG tablet Take 1 tablet (400 mg total) by mouth 2 (two) times daily. 90 tablet 1   metoCLOPramide (REGLAN) 10 MG tablet Take 1 tablet (10 mg total) by mouth daily. 90 tablet 1   mometasone (NASONEX) 50 MCG/ACT nasal spray INSTILL 2 SPRAYS IN EACH NOSTRIL EVERY DAY 17 g 12   naproxen (NAPROSYN) 500 MG tablet Take 1 tablet (500 mg total) by mouth daily as needed for mild pain. 30 tablet 0   oxybutynin (DITROPAN-XL) 5 MG 24 hr tablet Take 1 tablet (5 mg total) by mouth at bedtime. 90 tablet 1   pantoprazole (PROTONIX) 40 MG tablet Take 1 tablet (40 mg total) by mouth 2 (two) times daily. 180 tablet 1   progesterone (PROMETRIUM) 200 MG capsule Take 200 mg by mouth at bedtime.     propranolol (INDERAL) 80 MG tablet Take 1 tablet (80 mg total) by mouth daily. 90 tablet 1   clotrimazole-betamethasone (LOTRISONE) cream APPLY TO THE AFFECTED AREA(S) topically DAILY (Patient not taking: Reported on 02/28/2022) 45 g 1   No facility-administered medications prior to visit.     Review of Systems:   Constitutional: No weight loss or gain, night sweats, fevers, chills, fatigue, or lassitude. HEENT: No headaches, difficulty swallowing, tooth/dental problems, or sore throat. No sneezing, itching, ear ache. +throat irritation, nasal congestion/drainage, post nasal drip, sinus pressure CV:  No chest pain, orthopnea, PND, swelling in lower extremities, anasarca, dizziness, palpitations, syncope Resp: No shortness of breath with exertion or at rest. No excess mucus or change in color of mucus. No productive or non-productive. No hemoptysis. No wheezing.  No chest wall deformity GI:  No heartburn, indigestion, abdominal pain, nausea, vomiting, diarrhea, change in bowel habits, loss of appetite,  Skin: No rash, lesions, ulcerations MSK:  No joint pain or swelling.  Neuro: No dizziness or lightheadedness.  Psych: No depression or anxiety. Mood stable.      Physical Exam:  BP 128/74   Pulse 77   Ht '5\' 6"'$  (1.676 m)   Wt 270 lb 12.8 oz (122.8 kg)   SpO2 97%   BMI 43.71 kg/m   GEN: Pleasant, interactive, well-appearing; morbidly obese; in no acute distress. HEENT:  Normocephalic and atraumatic. PERRLA. Sclera white. Nasal turbinates erythematous, moist and patent bilaterally. Frontal sinus tenderness. No rhinorrhea present. Oropharynx pink and moist, without exudate or edema. No lesions, ulcerations NECK:  Supple w/ fair ROM. No JVD present. Normal carotid impulses w/o bruits. Thyroid symmetrical with no goiter or nodules palpated. No lymphadenopathy.   CV: RRR, no m/r/g, no peripheral edema. Pulses intact, +2 bilaterally. No cyanosis, pallor or clubbing. PULMONARY:  Unlabored, regular breathing. Clear bilaterally A&P w/o wheezes/rales/rhonchi. No accessory muscle use.  GI: BS present and normoactive. Soft, non-tender to palpation. No organomegaly or masses detected. MSK: No erythema, warmth or tenderness. Cap refil <2 sec all extrem. No deformities  or joint swelling noted.  Neuro: A/Ox3. No focal deficits noted.   Skin: Warm, no lesions or rashe Psych: Normal affect and behavior. Judgement and thought content appropriate.     Lab Results:  CBC    Component Value Date/Time   WBC 9.7 11/24/2021 0854   WBC 11.2 (H) 01/07/2019 1407   RBC 4.96 11/24/2021 0854   RBC 5.22 (H) 01/07/2019 1407   HGB 14.7 11/24/2021 0854   HCT 43.6 11/24/2021 0854   PLT 326 11/24/2021 0854   MCV 88 11/24/2021 0854   MCH 29.6 11/24/2021 0854   MCH 28.4 01/07/2019 1407   MCHC 33.7 11/24/2021 0854   MCHC 32.2 01/07/2019 1407   RDW 13.2 11/24/2021 0854   LYMPHSABS 2.6 11/24/2021 0854   MONOABS 0.9 12/05/2011 1937   EOSABS 0.2 11/24/2021 0854   BASOSABS 0.1 11/24/2021 0854    BMET    Component Value Date/Time   NA 141 11/24/2021 0854   K 4.7 11/24/2021 0854   CL 103 11/24/2021 0854   CO2 23 11/24/2021 0854   GLUCOSE 108 (H) 11/24/2021 0854    GLUCOSE 95 01/07/2019 1407   BUN 13 11/24/2021 0854   CREATININE 0.78 11/24/2021 0854   CALCIUM 9.9 11/24/2021 0854   GFRNONAA 78 04/22/2020 1248   GFRAA 90 04/22/2020 1248    BNP No results found for: "BNP"   Imaging:  No results found.        No data to display          No results found for: "NITRICOXIDE"      Assessment & Plan:   OSA (obstructive sleep apnea) Severe OSA on CPAP. Excellent compliance and control with residual AHI 0.1. No significant leaks. Pressures seem to be set appropriately. We will send orders for new supplies and head strap for her today.   Patient Instructions  Continue to use CPAP every night, minimum of 4-6 hours a night.  Change equipment every 30 days or as directed by DME. Wash your tubing with warm soap and water daily, hang to dry. Wash humidifier portion weekly.  Be aware of reduced alertness and do not drive or operate heavy machinery if experiencing this or drowsiness.  Exercise encouraged, as tolerated. Avoid or decrease alcohol consumption and medications that make you more sleepy, if possible. Notify if persistent daytime sleepiness occurs even with consistent use of CPAP.  Restart astelin nasal spray 2 sprays each nostril Twice daily for nasal congestion Continue nasocort 2 sprays each nostril daily Continue pantoprazole 1 tab daily for reflux Continue allegra daily  Augmentin 1 tab Twice daily for 10 days. Take with food. Take a daily probiotic while taking Use saline nasal rinses one to two times a day then use your nasal sprays about 20-30 minutes later  Guaifenesin [phone] mg Twice daily for nasal congestion  Follow up in one year with Dr. Halford Chessman. If symptoms do not improve or worsen, please contact office for sooner follow up or seek emergency care.    Acute bacterial rhinosinusitis Unresolved ABRS; initial improvement with previous course of abx. We will treat her with empiric augmentin 10 day course. Target  symptom management. If symptoms do not improve or recur, she will need ENT referral and possible CT of the sinuses.   I spent 35 minutes of dedicated to the care of this patient on the date of this encounter to include pre-visit review of records, face-to-face time with the patient discussing conditions above, post visit ordering of testing, clinical documentation  with the electronic health record, making appropriate referrals as documented, and communicating necessary findings to members of the patients care team.  Clayton Bibles, NP 02/28/2022  Pt aware and understands NP's role.

## 2022-02-28 NOTE — Assessment & Plan Note (Addendum)
Unresolved ABRS; initial improvement with previous course of abx. We will treat her with empiric augmentin 10 day course. Target symptom management. If symptoms do not improve or recur, she will need ENT referral and possible CT of the sinuses.

## 2022-03-01 ENCOUNTER — Other Ambulatory Visit: Payer: Self-pay | Admitting: Nurse Practitioner

## 2022-03-04 NOTE — Progress Notes (Signed)
Reviewed and agree with assessment/plan.   Chesley Mires, MD [facility] Pulmonary/Critical Care 03/04/2022, 7:18 PM Pager:  [phone]

## 2022-03-27 ENCOUNTER — Other Ambulatory Visit: Payer: Self-pay | Admitting: Nurse Practitioner

## 2022-04-12 ENCOUNTER — Other Ambulatory Visit: Payer: Self-pay | Admitting: Nurse Practitioner

## 2022-04-12 NOTE — Telephone Encounter (Signed)
  Prescription Request  04/12/2022  Is this a "Controlled Substance" medicine? progesterone (PROMETRIUM) 200 MG capsule   Have you seen your PCP in the last 2 weeks? Pt has apt 05/25/2022  If YES, route message to pool  -  If NO, patient needs to be scheduled for appointment.  What is the name of the medication or equipment? progesterone (PROMETRIUM) 200 MG capsule   Have you contacted your pharmacy to request a refill? yes   Which pharmacy would you like this sent to? [facility]   Patient notified that their request is being sent to the clinical staff for review and that they should receive a response within 2 business days.

## 2022-04-19 ENCOUNTER — Other Ambulatory Visit: Payer: Self-pay | Admitting: Nurse Practitioner

## 2022-04-23 ENCOUNTER — Other Ambulatory Visit: Payer: Self-pay | Admitting: Nurse Practitioner

## 2022-04-23 DIAGNOSIS — G43901 Migraine, unspecified, not intractable, with status migrainosus: Secondary | ICD-10-CM

## 2022-04-23 DIAGNOSIS — E039 Hypothyroidism, unspecified: Secondary | ICD-10-CM

## 2022-04-23 DIAGNOSIS — K219 Gastro-esophageal reflux disease without esophagitis: Secondary | ICD-10-CM

## 2022-04-27 ENCOUNTER — Ambulatory Visit (INDEPENDENT_AMBULATORY_CARE_PROVIDER_SITE_OTHER): Payer: No Typology Code available for payment source | Admitting: Nurse Practitioner

## 2022-04-27 ENCOUNTER — Encounter: Payer: Self-pay | Admitting: Nurse Practitioner

## 2022-04-27 VITALS — BP 141/83 | HR 80 | Temp 97.6°F | Resp 20 | Ht 66.0 in | Wt 273.0 lb

## 2022-04-27 DIAGNOSIS — G44219 Episodic tension-type headache, not intractable: Secondary | ICD-10-CM | POA: Diagnosis not present

## 2022-04-27 MED ORDER — NAPROXEN 500 MG PO TABS: 500.0000 mg | ORAL_TABLET | Freq: Two times a day (BID) | ORAL | 1 refills | Status: DC

## 2022-04-27 NOTE — Patient Instructions (Signed)
Form - Headache Record There are many types and causes of headaches. A headache record can help guide your treatment plan. Use this form to record the details. Bring this form with you to your follow-up visits. Follow your health care provider's instructions on how to describe your headache. You may be asked to: Use a pain scale. This is a tool to rate the intensity of your headache using words or numbers. Describe what your headache feels like, such as dull, achy, throbbing, or sharp. Headache record Date: _______________ Time (from start to end): ____________________ Location of the headache: _________________________ Intensity of the headache: ____________________ Description of the headache: ______________________________________________________________ Hours of sleep the night before the headache: __________ Food or drinks before the headache started: ______________________________________________________________________________________ Events before the headache started: _______________________________________________________________________________________________ Symptoms before the headache started: __________________________________________________________________________________________ Symptoms during the headache: __________________________________________________________________________________________________ Treatment: ________________________________________________________________________________________________________________ Effect of treatment: _________________________________________________________________________________________________________ Other comments: ___________________________________________________________________________________________________________ Date: _______________ Time (from start to end): ____________________ Location of the headache: _________________________ Intensity of the headache: ____________________ Description of the headache:  ______________________________________________________________ Hours of sleep the night before the headache: __________ Food or drinks before the headache started: ______________________________________________________________________________________ Events before the headache started: ____________________________________________________________________________________________ Symptoms before the headache started: _________________________________________________________________________________________ Symptoms during the headache: _______________________________________________________________________________________________ Treatment: ________________________________________________________________________________________________________________ Effect of treatment: _________________________________________________________________________________________________________ Other comments: ___________________________________________________________________________________________________________ Date: _______________ Time (from start to end): ____________________ Location of the headache: _________________________ Intensity of the headache: ____________________ Description of the headache: ______________________________________________________________ Hours of sleep the night before the headache: __________ Food or drinks before the headache started: ______________________________________________________________________________________ Events before the headache started: ____________________________________________________________________________________________ Symptoms before the headache started: _________________________________________________________________________________________ Symptoms during the headache: _______________________________________________________________________________________________ Treatment:  ________________________________________________________________________________________________________________ Effect of treatment: _________________________________________________________________________________________________________ Other comments: ___________________________________________________________________________________________________________ Date: _______________ Time (from start to end): ____________________ Location of the headache: _________________________ Intensity of the headache: ____________________ Description of the headache: ______________________________________________________________ Hours of sleep the night before the headache: _________ Food or drinks before the headache started: ______________________________________________________________________________________ Events before the headache started: ____________________________________________________________________________________________ Symptoms before the headache started: _________________________________________________________________________________________ Symptoms during the headache: _______________________________________________________________________________________________ Treatment: ________________________________________________________________________________________________________________ Effect of treatment: _________________________________________________________________________________________________________ Other comments: ___________________________________________________________________________________________________________ Date: _______________ Time (from start to end): ____________________ Location of the headache: _________________________ Intensity of the headache: ____________________ Description of the headache: ______________________________________________________________ Hours of sleep the night before the headache: _________ Food or drinks before the headache started:  ______________________________________________________________________________________ Events before the headache started: ____________________________________________________________________________________________ Symptoms before the headache started: _________________________________________________________________________________________ Symptoms during the headache: _______________________________________________________________________________________________ Treatment: ________________________________________________________________________________________________________________ Effect of treatment: _________________________________________________________________________________________________________ Other comments: ___________________________________________________________________________________________________________ This information is not intended to replace advice given to you by your health care provider. Make sure you discuss any questions you have with your health care provider. Document Revised: 07/27/2020 Document Reviewed: 07/27/2020 Elsevier Patient Education  [name] [name].

## 2022-04-27 NOTE — Progress Notes (Signed)
Subjective:    Patient ID: [name] [name], female    DOB: 1971/01/30, 52 y.o.   MRN: [id number]   Chief Complaint: headaches  Headache  This is a recurrent problem. The problem occurs intermittently (ossurs 2x a week, lasting at least 75mnutes at least). The problem has been waxing and waning. The pain is located in the Bilateral region. The pain does not radiate. The quality of the pain is described as pulsating, shooting and stabbing. The pain is at a severity of 10/10. The pain is severe. Pertinent negatives include no facial sweating, hearing loss, nausea, numbness, phonophobia, photophobia, scalp tenderness or seizures. Nothing aggravates the symptoms. She has tried NSAIDs for the symptoms. The treatment provided moderate relief.   BP Readings from Last 3 Encounters:  04/27/22 (!) 141/83  02/28/22 128/74  11/24/21 128/74       Review of Systems  HENT:  Negative for hearing loss.   Eyes:  Negative for photophobia.  Gastrointestinal:  Negative for nausea.  Neurological:  Positive for headaches. Negative for seizures and numbness.       Objective:   Physical Exam Constitutional:      Appearance: Normal appearance. [name] obese.  Cardiovascular:     Rate and Rhythm: Normal rate and regular rhythm.     Heart sounds: Normal heart sounds.  Pulmonary:     Effort: Pulmonary effort is normal.     Breath sounds: Normal breath sounds.  Skin:    General: Skin is warm.  Neurological:     General: No focal deficit present.     Mental Status: [name] alert and oriented to person, place, and time. Mental status is at baseline.     Cranial Nerves: No cranial nerve deficit.     Sensory: No sensory deficit.     Coordination: Coordination normal.     Gait: Gait normal.     Deep Tendon Reflexes: Reflexes normal.  Psychiatric:        Mood and Affect: Mood normal.        Behavior: Behavior normal.    BP (!) 141/83   Pulse 80   Temp 97.6 F (36.4 C) (Temporal)   Resp 20   Ht 5'  6" (1.676 m)   Wt 273 lb (123.8 kg)   SpO2 95%   BMI 44.06 kg/m         Assessment & Plan:   [name] [name] in today with chief complaint of Headache (Stabbing pains in head. Going on for over a month/) and Hot Flashes   1. Episodic tension-type headache, not intractable Avoid caffeine Force fluids Check blood pressure if you can when have headache Keep diary of headaches Need eye exam RTO prn- will do neuro referral if continues.  Meds ordered this encounter  Medications   naproxen (NAPROSYN) 500 MG tablet    Sig: Take 1 tablet (500 mg total) by mouth 2 (two) times daily with a meal.    Dispense:  60 tablet    Refill:  1    Order Specific Question:   Supervising Provider    Answer:   DCaryl PinaA [[id number]       The above assessment and management plan was discussed with the patient. The patient verbalized understanding of and has agreed to the management plan. Patient is aware to call the clinic if symptoms persist or worsen. Patient is aware when to return to the clinic for a follow-up visit. Patient educated on when it is appropriate to  go to the emergency department.   [name] Hassell Done, FNP

## 2022-05-22 ENCOUNTER — Other Ambulatory Visit: Payer: Self-pay | Admitting: Nurse Practitioner

## 2022-05-22 DIAGNOSIS — R3915 Urgency of urination: Secondary | ICD-10-CM

## 2022-05-22 DIAGNOSIS — K219 Gastro-esophageal reflux disease without esophagitis: Secondary | ICD-10-CM

## 2022-05-22 DIAGNOSIS — M545 Low back pain, unspecified: Secondary | ICD-10-CM

## 2022-05-25 ENCOUNTER — Telehealth (HOSPITAL_BASED_OUTPATIENT_CLINIC_OR_DEPARTMENT_OTHER): Payer: Self-pay | Admitting: Pulmonary Disease

## 2022-05-25 ENCOUNTER — Ambulatory Visit: Payer: 59 | Admitting: Nurse Practitioner

## 2022-05-25 ENCOUNTER — Other Ambulatory Visit: Payer: Self-pay | Admitting: Nurse Practitioner

## 2022-05-25 DIAGNOSIS — F410 Panic disorder [episodic paroxysmal anxiety] without agoraphobia: Secondary | ICD-10-CM

## 2022-05-25 DIAGNOSIS — G4733 Obstructive sleep apnea (adult) (pediatric): Secondary | ICD-10-CM

## 2022-05-25 NOTE — Telephone Encounter (Signed)
Order that was sent in December just stated to renew pt's cpap prescription but this was when pt's DME was [facility]. Pt has new insurance and the order must go to [facility] in [place].  What settings is pt currently on as we need to do a whole new prescription with all necessary information.

## 2022-05-25 NOTE — Telephone Encounter (Signed)
CPAP 5-20 cmH2O with mask of choice and heated humidification. Thanks.

## 2022-05-25 NOTE — Telephone Encounter (Signed)
Pt. Calling she needs new Cpap machine and new order to [facility]. In [place] [phone] they also need a copy of sleep study sent to them please call pt. Back when we have be able to get this done for her

## 2022-05-25 NOTE — Telephone Encounter (Signed)
Patient has not been seen by Dr. Halford Chessman since 2019. Pt last saw Joellen Jersey in December 2023. Katie, please advise if you are okay with [place] placing order for pt to receive a new cpap machine.

## 2022-05-25 NOTE — Telephone Encounter (Signed)
I believe we sent orders in December for a new CPAP. Ok to resend the order but let's call them to make sure they received it. Thanks!

## 2022-05-25 NOTE — Telephone Encounter (Signed)
New cpap order has been placed. Attempted to call pt to let her know this had been done but unable to reach. Left her a detailed message letting her know that the order for new cpap to [facility] had been placed. Nothing further needed.

## 2022-05-25 NOTE — Telephone Encounter (Signed)
PT calling again. Wanted [place] to know that She has different insurance under [facility]. RX must be sent to [facility] in [place].  Also, please fax a sleep study to [facility] along with new RX.

## 2022-06-07 ENCOUNTER — Encounter: Payer: Self-pay | Admitting: Nurse Practitioner

## 2022-06-07 ENCOUNTER — Ambulatory Visit (INDEPENDENT_AMBULATORY_CARE_PROVIDER_SITE_OTHER): Payer: No Typology Code available for payment source | Admitting: Nurse Practitioner

## 2022-06-07 VITALS — BP 129/76 | HR 71 | Temp 97.6°F | Resp 20 | Ht 66.0 in | Wt 275.0 lb

## 2022-06-07 DIAGNOSIS — I471 Supraventricular tachycardia, unspecified: Secondary | ICD-10-CM

## 2022-06-07 DIAGNOSIS — R3915 Urgency of urination: Secondary | ICD-10-CM

## 2022-06-07 DIAGNOSIS — F410 Panic disorder [episodic paroxysmal anxiety] without agoraphobia: Secondary | ICD-10-CM

## 2022-06-07 DIAGNOSIS — K219 Gastro-esophageal reflux disease without esophagitis: Secondary | ICD-10-CM | POA: Diagnosis not present

## 2022-06-07 DIAGNOSIS — G43901 Migraine, unspecified, not intractable, with status migrainosus: Secondary | ICD-10-CM

## 2022-06-07 DIAGNOSIS — E039 Hypothyroidism, unspecified: Secondary | ICD-10-CM

## 2022-06-07 MED ORDER — METOCLOPRAMIDE HCL 10 MG PO TABS: 10.0000 mg | ORAL_TABLET | Freq: Every day | ORAL | 0 refills | Status: DC

## 2022-06-07 MED ORDER — PROPRANOLOL HCL 80 MG PO TABS: 80.0000 mg | ORAL_TABLET | Freq: Every day | ORAL | 0 refills | Status: DC

## 2022-06-07 MED ORDER — PANTOPRAZOLE SODIUM 40 MG PO TBEC: 40.0000 mg | DELAYED_RELEASE_TABLET | Freq: Two times a day (BID) | ORAL | 0 refills | Status: DC

## 2022-06-07 MED ORDER — OXYBUTYNIN CHLORIDE ER 5 MG PO TB24: 5.0000 mg | ORAL_TABLET | Freq: Every day | ORAL | 0 refills | Status: DC

## 2022-06-07 MED ORDER — CLONAZEPAM 0.5 MG PO TABS: 0.5000 mg | ORAL_TABLET | Freq: Two times a day (BID) | ORAL | 5 refills | Status: DC | PRN

## 2022-06-07 NOTE — Progress Notes (Signed)
Subjective:    Patient ID: [name] [name], female    DOB: 04/29/1970, 52 y.o.   MRN: [id number]   Chief Complaint: medical management of chronic issues     HPI:  [name] [name] is a 52 y.o. who identifies as a female who was assigned female at birth.   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 SVT (supraventricular tachycardia) Denies heart racing or palpitations  2. Migraine with status migrainosus, not intractable, unspecified migraine type Has had recent headaches. Was seen on 04/27/22 with frequent headache. She was given naprosyn to take as needed. They have improved. She went to chiropractor nad had neck adjusted and that seemed to help.  3. Gastroesophageal reflux disease without esophagitis Is on reglan and protonix daily. Has bad symptoms if does not take.  4. Acquired hypothyroidism No problems that she is aware of. Lab Results  Component Value Date   TSH 1.360 11/24/2021     5. Panic attacks Takes klonopin as needed   6. Morbid obesity ([place]) No recent weight changes Wt Readings from Last 3 Encounters:  06/07/22 275 lb (124.7 kg)  04/27/22 273 lb (123.8 kg)  02/28/22 270 lb 12.8 oz (122.8 kg)   BMI Readings from Last 3 Encounters:  06/07/22 44.39 kg/m  04/27/22 44.06 kg/m  02/28/22 43.71 kg/m      New complaints: None today  Allergies  Allergen Reactions   Chlorpheniramine Other (See Comments)   Pseudoephedrine Hcl Other (See Comments)   Outpatient Encounter Medications as of 06/07/2022  Medication Sig   oxybutynin (DITROPAN-XL) 5 MG 24 hr tablet TAKE 1 TABLET BY MOUTH AT BEDTIME   pantoprazole (PROTONIX) 40 MG tablet TAKE 1 TABLET BY MOUTH TWICE DAILY   azelastine (ASTELIN) 0.1 % nasal spray Place 2 sprays into both nostrils 2 (two) times daily. Use in each nostril as directed   cholecalciferol (VITAMIN D3) 25 MCG (1000 UNIT)  tablet Take 2,000 Units by mouth daily.   clonazePAM (KLONOPIN) 0.5 MG tablet Take 1 tablet (0.5 mg total) by mouth 2 (two) times daily as needed.   clotrimazole-betamethasone (LOTRISONE) cream APPLY TO THE AFFECTED AREA(S) topically DAILY   cyclobenzaprine (FLEXERIL) 10 MG tablet TAKE 2 TABLETS BY MOUTH AT BEDTIME   fexofenadine (ALLEGRA) 180 MG tablet Take 180 mg by mouth daily.   levothyroxine (SYNTHROID) 88 MCG tablet TAKE 1 TABLET BY MOUTH DAILY   liothyronine (CYTOMEL) 5 MCG tablet TAKE 1 TABLET BY MOUTH EVERY DAY   magnesium oxide (MAG-OX) 400 (240 Mg) MG tablet TAKE 1 TABLET BY MOUTH TWICE DAILY   metoCLOPramide (REGLAN) 10 MG tablet TAKE 1 TABLET BY MOUTH DAILY   mometasone (NASONEX) 50 MCG/ACT nasal spray INSTILL 2 SPRAYS IN EACH NOSTRIL EVERY DAY   naproxen (NAPROSYN) 500 MG tablet Take 1 tablet (500 mg total) by mouth 2 (two) times daily with a meal.   progesterone (PROMETRIUM) 200 MG capsule TAKE ONE CAPSULE BY MOUTH AT BEDTIME   propranolol (INDERAL) 80 MG tablet TAKE 1 TABLET BY MOUTH DAILY   No facility-administered encounter medications on file as of 06/07/2022.    Past Surgical History:  Procedure Laterality Date   CESAREAN SECTION     1194   COLONOSCOPY     KNEE ARTHROSCOPY     LITHOTRIPSY  12/2014   ROTATOR CUFF REPAIR Right 2021    Family History  Problem Relation  Age of Onset   Hypertension Mother    Lung cancer Father        died from   Heart disease Maternal Grandmother    Heart disease Maternal Grandfather    Breast cancer Paternal Grandmother        paternal aunts x2   Colon cancer Neg Hx    Esophageal cancer Neg Hx    Rectal cancer Neg Hx    Stomach cancer Neg Hx    Colon polyps Neg Hx       Controlled substance contract: n/a     Review of Systems  Constitutional:  Negative for diaphoresis.  Eyes:  Negative for pain.  Respiratory:  Negative for shortness of breath.   Cardiovascular:  Negative for chest pain, palpitations and leg  swelling.  Gastrointestinal:  Negative for abdominal pain.  Endocrine: Negative for polydipsia.  Skin:  Negative for rash.  Neurological:  Negative for dizziness, weakness and headaches.  Hematological:  Does not bruise/bleed easily.  All other systems reviewed and are negative.      Objective:   Physical Exam Vitals and nursing note reviewed.  Constitutional:      General: [name] not in acute distress.    Appearance: Normal appearance. [name] well-developed.  HENT:     Head: Normocephalic.     Right Ear: Tympanic membrane normal.     Left Ear: Tympanic membrane normal.     Nose: Nose normal.     Mouth/Throat:     Mouth: Mucous membranes are moist.  Eyes:     Pupils: Pupils are equal, round, and reactive to light.  Neck:     Vascular: No carotid bruit or JVD.  Cardiovascular:     Rate and Rhythm: Normal rate and regular rhythm.     Heart sounds: Normal heart sounds.  Pulmonary:     Effort: Pulmonary effort is normal. No respiratory distress.     Breath sounds: Normal breath sounds. No wheezing or rales.  Chest:     Chest wall: No tenderness.  Abdominal:     General: Bowel sounds are normal. There is no distension or abdominal bruit.     Palpations: Abdomen is soft. There is no hepatomegaly, splenomegaly, mass or pulsatile mass.     Tenderness: There is no abdominal tenderness.  Musculoskeletal:        General: Normal range of motion.     Cervical back: Normal range of motion and neck supple.  Lymphadenopathy:     Cervical: No cervical adenopathy.  Skin:    General: Skin is warm and dry.  Neurological:     Mental Status: [name] alert and oriented to person, place, and time.     Deep Tendon Reflexes: Reflexes are normal and symmetric.  Psychiatric:        Behavior: Behavior normal.        Thought Content: Thought content normal.        Judgment: Judgment normal.    BP 129/76   Pulse 71   Temp 97.6 F (36.4 C) (Temporal)   Resp 20   Ht 5\' 6"  (1.676 m)   Wt  275 lb (124.7 kg)   SpO2 96%   BMI 44.39 kg/m         Assessment & Plan:  Derrika Koshy comes in today with chief complaint of Medical Management of Chronic Issues   Diagnosis and orders addressed:  1. SVT (supraventricular tachycardia) Avoid caffeine - Lipid panel  2. Migraine with status migrainosus,  not intractable, unspecified migraine type  - propranolol (INDERAL) 80 MG tablet; Take 1 tablet (80 mg total) by mouth daily.  Dispense: 90 tablet; Refill: 0  3. Gastroesophageal reflux disease without esophagitis Avoid spicy foods Do not eat 2 hours prior to bedtime  - metoCLOPramide (REGLAN) 10 MG tablet; Take 1 tablet (10 mg total) by mouth daily.  Dispense: 90 tablet; Refill: 0 - pantoprazole (PROTONIX) 40 MG tablet; Take 1 tablet (40 mg total) by mouth 2 (two) times daily.  Dispense: 180 tablet; Refill: 0  4. Acquired hypothyroidism Labs pending - CBC with Differential/Platelet - CMP14+EGFR - Thyroid Panel With TSH  5. Panic attacks Stress management - clonazePAM (KLONOPIN) 0.5 MG tablet; Take 1 tablet (0.5 mg total) by mouth 2 (two) times daily as needed.  Dispense: 60 tablet; Refill: 5 - ToxASSURE Select 13 (MW), Urine  6. Morbid obesity ([place]) Discussed diet and exercise for person with BMI >25 Will recheck weight in 3-6 months   7. Urinary urgency - oxybutynin (DITROPAN-XL) 5 MG 24 hr tablet; Take 1 tablet (5 mg total) by mouth at bedtime.  Dispense: 90 tablet; Refill: 0   Labs pending Health Maintenance reviewed Diet and exercise encouraged  Follow up plan: 6 months   Mary-Margaret Hassell Done, FNP

## 2022-06-07 NOTE — Patient Instructions (Signed)

## 2022-06-08 LAB — CMP14+EGFR
ALT: 34 IU/L — ABNORMAL HIGH (ref 0–32)
AST: 29 IU/L (ref 0–40)
Albumin/Globulin Ratio: 1.4 (ref 1.2–2.2)
Albumin: 4.2 g/dL (ref 3.9–4.9)
Alkaline Phosphatase: 75 IU/L (ref 44–121)
BUN/Creatinine Ratio: 14 (ref 9–23)
BUN: 11 mg/dL (ref 6–24)
Bilirubin Total: 0.3 mg/dL (ref 0.0–1.2)
CO2: 23 mmol/L (ref 20–29)
Calcium: 9.6 mg/dL (ref 8.7–10.2)
Chloride: 103 mmol/L (ref 96–106)
Creatinine, Ser: 0.81 mg/dL (ref 0.57–1.00)
Globulin, Total: 2.9 g/dL (ref 1.5–4.5)
Glucose: 92 mg/dL (ref 70–99)
Potassium: 4.4 mmol/L (ref 3.5–5.2)
Sodium: 142 mmol/L (ref 134–144)
Total Protein: 7.1 g/dL (ref 6.0–8.5)
eGFR: 88 mL/min/{1.73_m2} (ref >59)

## 2022-06-08 LAB — LIPID PANEL
Chol/HDL Ratio: 4.3 ratio (ref 0.0–4.4)
Cholesterol, Total: 160 mg/dL (ref 100–199)
HDL: 37 mg/dL — ABNORMAL LOW (ref >39)
LDL Chol Calc (NIH): 67 mg/dL (ref 0–99)
Triglycerides: 360 mg/dL — ABNORMAL HIGH (ref 0–149)
VLDL Cholesterol Cal: 56 mg/dL — ABNORMAL HIGH (ref 5–40)

## 2022-06-08 LAB — CBC WITH DIFFERENTIAL/PLATELET
Basophils Absolute: 0.1 10*3/uL (ref 0.0–0.2)
Basos: 1 %
EOS (ABSOLUTE): 0.2 10*3/uL (ref 0.0–0.4)
Eos: 3 %
Hematocrit: 45.3 % (ref 34.0–46.6)
Hemoglobin: 14.9 g/dL (ref 11.1–15.9)
Immature Grans (Abs): 0.1 10*3/uL (ref 0.0–0.1)
Immature Granulocytes: 1 %
Lymphocytes Absolute: 3.2 10*3/uL — ABNORMAL HIGH (ref 0.7–3.1)
Lymphs: 33 %
MCH: 29 pg (ref 26.6–33.0)
MCHC: 32.9 g/dL (ref 31.5–35.7)
MCV: 88 fL (ref 79–97)
Monocytes Absolute: 0.9 10*3/uL (ref 0.1–0.9)
Monocytes: 10 %
Neutrophils Absolute: 5.3 10*3/uL (ref 1.4–7.0)
Neutrophils: 52 %
Platelets: 302 10*3/uL (ref 150–450)
RBC: 5.13 x10E6/uL (ref 3.77–5.28)
RDW: 13.9 % (ref 11.7–15.4)
WBC: 9.7 10*3/uL (ref 3.4–10.8)

## 2022-06-08 LAB — THYROID PANEL WITH TSH
Free Thyroxine Index: 1.2 (ref 1.2–4.9)
T3 Uptake Ratio: 21 % — ABNORMAL LOW (ref 24–39)
T4, Total: 5.8 ug/dL (ref 4.5–12.0)
TSH: 3.18 u[IU]/mL (ref 0.450–4.500)

## 2022-06-13 LAB — TOXASSURE SELECT 13 (MW), URINE

## 2022-06-21 ENCOUNTER — Other Ambulatory Visit: Payer: Self-pay | Admitting: Nurse Practitioner

## 2022-06-21 DIAGNOSIS — M545 Low back pain, unspecified: Secondary | ICD-10-CM

## 2022-07-22 ENCOUNTER — Other Ambulatory Visit: Payer: Self-pay | Admitting: Nurse Practitioner

## 2022-07-22 DIAGNOSIS — M545 Low back pain, unspecified: Secondary | ICD-10-CM

## 2022-08-10 ENCOUNTER — Other Ambulatory Visit: Payer: Self-pay | Admitting: Nurse Practitioner

## 2022-08-17 ENCOUNTER — Telehealth: Payer: Self-pay | Admitting: Pulmonary Disease

## 2022-08-21 NOTE — Telephone Encounter (Signed)
She was just seen in Dec.  Her sleep study was done in 2016.  She was using a different dme so Christoper Allegra would be new for her.  I would say just place a new cpap order and put on the order pt wants to use [facility].  Order can be sent to them and if there is an issue Christoper Allegra will let us know.  I don't think she would need new ov.

## 2022-08-21 NOTE — Telephone Encounter (Signed)
Would this pt need a recent ov ? Please advise

## 2022-08-22 ENCOUNTER — Other Ambulatory Visit: Payer: Self-pay

## 2022-08-22 DIAGNOSIS — G4733 Obstructive sleep apnea (adult) (pediatric): Secondary | ICD-10-CM

## 2022-08-22 NOTE — Telephone Encounter (Signed)
Replaced cpap order for [name] [name]

## 2022-09-04 ENCOUNTER — Encounter: Payer: Self-pay | Admitting: Family

## 2022-09-04 ENCOUNTER — Ambulatory Visit (INDEPENDENT_AMBULATORY_CARE_PROVIDER_SITE_OTHER): Payer: No Typology Code available for payment source | Admitting: Family

## 2022-09-04 VITALS — BP 152/89 | HR 87 | Temp 97.8°F | Ht 66.0 in | Wt 277.0 lb

## 2022-09-04 DIAGNOSIS — U071 COVID-19: Secondary | ICD-10-CM

## 2022-09-04 MED ORDER — AZELASTINE HCL 0.1 % NA SOLN
2.0000 | Freq: Two times a day (BID) | NASAL | 6 refills | Status: AC
Start: 2022-09-04 — End: ?

## 2022-09-04 MED ORDER — NIRMATRELVIR/RITONAVIR (PAXLOVID)TABLET
3.0000 | ORAL_TABLET | Freq: Two times a day (BID) | ORAL | 0 refills | Status: AC
Start: 2022-09-04 — End: 2022-09-09

## 2022-09-04 NOTE — Progress Notes (Addendum)
Subjective:    Patient ID: [name] [name], female    DOB: 30-Jun-1970, 52 y.o.   MRN: [id number]  Chief Complaint  Patient presents with   Nasal Congestion   Headache   Sore Throat   Chills    Patient states this started yesterday. Positive covid today    PT presents to the office today with COVID. She reports her symptoms started yesterday and tested positive.  Headache  Associated symptoms include coughing, ear pain, rhinorrhea, a sore throat and swollen glands.  Sore Throat  Associated symptoms include congestion, coughing, ear pain, headaches and swollen glands.  URI  This is a new problem. The current episode started more than 1 month ago. The problem has been gradually worsening. There has been no fever. Associated symptoms include congestion, coughing, ear pain, headaches, joint pain, rhinorrhea, sinus pain, sneezing, a sore throat and swollen glands. She has tried acetaminophen for the symptoms. The treatment provided mild relief.      Review of Systems  HENT:  Positive for congestion, ear pain, rhinorrhea, sinus pain, sneezing and sore throat.   Respiratory:  Positive for cough.   Musculoskeletal:  Positive for joint pain.  Neurological:  Positive for headaches.  All other systems reviewed and are negative.      Objective:   Physical Exam Vitals reviewed.  Constitutional:      General: [name] not in acute distress.    Appearance: [name] well-developed. [name] obese.  HENT:     Head: Normocephalic and atraumatic.     Right Ear: External ear normal.  Eyes:     Pupils: Pupils are equal, round, and reactive to light.  Neck:     Thyroid: No thyromegaly.  Cardiovascular:     Rate and Rhythm: Normal rate and regular rhythm.     Heart sounds: Normal heart sounds. No murmur heard. Pulmonary:     Effort: Pulmonary effort is normal. No respiratory distress.     Breath sounds: Normal breath sounds. No wheezing.  Abdominal:     General: Bowel sounds are normal. There is  no distension.     Palpations: Abdomen is soft.     Tenderness: There is no abdominal tenderness.  Musculoskeletal:        General: No tenderness. Normal range of motion.     Cervical back: Normal range of motion and neck supple.  Skin:    General: Skin is warm and dry.  Neurological:     Mental Status: [name] alert and oriented to person, place, and time.     Cranial Nerves: No cranial nerve deficit.     Deep Tendon Reflexes: Reflexes are normal and symmetric.  Psychiatric:        Behavior: Behavior normal.        Thought Content: Thought content normal.        Judgment: Judgment normal.      BP (!) 152/89   Pulse 87   Temp 97.8 F (36.6 C) (Temporal)   Ht 5\' 6"  (1.676 m)   Wt 277 lb (125.6 kg)   SpO2 94%   BMI 44.71 kg/m       Assessment & Plan:  [name] [name] comes in today with chief complaint of Nasal Congestion, Headache, Sore Throat, and Chills (Patient states this started yesterday. Positive covid today )   Diagnosis and orders addressed:  1. COVID-19 COVID positive, rest, force fluids, tylenol as needed, Quarantine until fever free, then must wear a mask out in public  from day 5, report any worsening symptoms such as increased shortness of breath, swelling, or continued high fevers. Possible adverse effects discussed with antivirals.  - azelastine (ASTELIN) 0.1 % nasal spray; Place 2 sprays into both nostrils 2 (two) times daily. Use in each nostril as directed  Dispense: 30 mL; Refill: 6 - nirmatrelvir/ritonavir (PAXLOVID) 20 x 150 MG & 10 x 100MG  TABS; Take 3 tablets by mouth 2 (two) times daily for 5 days. (Take nirmatrelvir 150 mg two tablets twice daily for 5 days and ritonavir 100 mg one tablet twice daily for 5 days) Patient GFR is 88  Dispense: 30 tablet; Refill: 0   Jannifer Rodney, FNP

## 2022-09-04 NOTE — Patient Instructions (Signed)

## 2022-09-07 ENCOUNTER — Telehealth: Payer: Self-pay | Admitting: Nurse Practitioner

## 2022-09-07 NOTE — Telephone Encounter (Signed)
Patient had an appt on 6/25 and tested positive for COVID. [name] still not feeling better and wants to know what she needs to do because she is supposed to go back to work on Monday 7/1, aware that she may not get a call back about this until 7/1 due to the provider she saw and PCP being on vacation and our office closing at five.

## 2022-09-10 MED ORDER — DOXYCYCLINE HYCLATE 100 MG PO TABS: 100.0000 mg | ORAL_TABLET | Freq: Two times a day (BID) | ORAL | 0 refills | Status: DC

## 2022-09-10 NOTE — Telephone Encounter (Signed)
Doxycycline Prescription sent to pharmacy, can make work note longer if needed.   Jannifer Rodney, FNP

## 2022-09-10 NOTE — Telephone Encounter (Signed)
Patient notified and verbalized understanding. Does not need work note extended. Went ahead and went back to work today

## 2022-09-10 NOTE — Telephone Encounter (Signed)
Please review and advise.

## 2022-09-19 ENCOUNTER — Other Ambulatory Visit: Payer: Self-pay | Admitting: Nurse Practitioner

## 2022-09-19 DIAGNOSIS — M545 Low back pain, unspecified: Secondary | ICD-10-CM

## 2022-10-17 ENCOUNTER — Other Ambulatory Visit: Payer: Self-pay | Admitting: Nurse Practitioner

## 2022-10-17 DIAGNOSIS — M545 Low back pain, unspecified: Secondary | ICD-10-CM

## 2022-11-11 ENCOUNTER — Other Ambulatory Visit: Payer: Self-pay | Admitting: Nurse Practitioner

## 2022-11-18 ENCOUNTER — Other Ambulatory Visit: Payer: Self-pay | Admitting: Nurse Practitioner

## 2022-11-18 DIAGNOSIS — M545 Low back pain, unspecified: Secondary | ICD-10-CM

## 2022-11-23 ENCOUNTER — Other Ambulatory Visit: Payer: Self-pay | Admitting: Nurse Practitioner

## 2022-11-27 ENCOUNTER — Other Ambulatory Visit: Payer: Self-pay | Admitting: Nurse Practitioner

## 2022-11-27 DIAGNOSIS — G43901 Migraine, unspecified, not intractable, with status migrainosus: Secondary | ICD-10-CM

## 2022-11-27 DIAGNOSIS — R3915 Urgency of urination: Secondary | ICD-10-CM

## 2022-11-27 DIAGNOSIS — K219 Gastro-esophageal reflux disease without esophagitis: Secondary | ICD-10-CM

## 2022-12-10 ENCOUNTER — Encounter: Payer: Self-pay | Admitting: Nurse Practitioner

## 2022-12-10 ENCOUNTER — Ambulatory Visit (INDEPENDENT_AMBULATORY_CARE_PROVIDER_SITE_OTHER): Payer: No Typology Code available for payment source | Admitting: Nurse Practitioner

## 2022-12-10 VITALS — BP 123/82 | HR 61 | Temp 97.7°F | Resp 20 | Ht 66.0 in | Wt 277.0 lb

## 2022-12-10 VITALS — BP 124/80 | HR 77 | Ht 66.0 in | Wt 279.0 lb

## 2022-12-10 DIAGNOSIS — R3915 Urgency of urination: Secondary | ICD-10-CM

## 2022-12-10 DIAGNOSIS — Z23 Encounter for immunization: Secondary | ICD-10-CM | POA: Diagnosis not present

## 2022-12-10 DIAGNOSIS — G4733 Obstructive sleep apnea (adult) (pediatric): Secondary | ICD-10-CM

## 2022-12-10 DIAGNOSIS — F410 Panic disorder [episodic paroxysmal anxiety] without agoraphobia: Secondary | ICD-10-CM

## 2022-12-10 DIAGNOSIS — G43901 Migraine, unspecified, not intractable, with status migrainosus: Secondary | ICD-10-CM

## 2022-12-10 DIAGNOSIS — Z6841 Body Mass Index (BMI) 40.0 and over, adult: Secondary | ICD-10-CM

## 2022-12-10 DIAGNOSIS — I471 Supraventricular tachycardia, unspecified: Secondary | ICD-10-CM

## 2022-12-10 DIAGNOSIS — E039 Hypothyroidism, unspecified: Secondary | ICD-10-CM

## 2022-12-10 DIAGNOSIS — K219 Gastro-esophageal reflux disease without esophagitis: Secondary | ICD-10-CM

## 2022-12-10 MED ORDER — OXYBUTYNIN CHLORIDE ER 5 MG PO TB24: 5.0000 mg | ORAL_TABLET | Freq: Every day | ORAL | 1 refills | Status: DC

## 2022-12-10 MED ORDER — LEVOTHYROXINE SODIUM 88 MCG PO TABS: 88.0000 ug | ORAL_TABLET | Freq: Every day | ORAL | 1 refills | Status: DC

## 2022-12-10 MED ORDER — PROPRANOLOL HCL 80 MG PO TABS
80.0000 mg | ORAL_TABLET | Freq: Every day | ORAL | 1 refills | Status: DC
Start: 2022-12-10 — End: 2023-05-16

## 2022-12-10 MED ORDER — PANTOPRAZOLE SODIUM 40 MG PO TBEC
40.0000 mg | DELAYED_RELEASE_TABLET | Freq: Two times a day (BID) | ORAL | 1 refills | Status: DC
Start: 2022-12-10 — End: 2023-06-11

## 2022-12-10 MED ORDER — CLONAZEPAM 0.5 MG PO TABS
0.5000 mg | ORAL_TABLET | Freq: Two times a day (BID) | ORAL | 5 refills | Status: DC | PRN
Start: 2022-12-10 — End: 2023-06-11

## 2022-12-10 MED ORDER — METOCLOPRAMIDE HCL 10 MG PO TABS
10.0000 mg | ORAL_TABLET | Freq: Every day | ORAL | 1 refills | Status: DC
Start: 2022-12-10 — End: 2023-05-28

## 2022-12-10 MED ORDER — MAGNESIUM OXIDE -MG SUPPLEMENT 400 (240 MG) MG PO TABS: 1.0000 | ORAL_TABLET | Freq: Two times a day (BID) | ORAL | 1 refills | Status: DC

## 2022-12-10 NOTE — Assessment & Plan Note (Signed)
Excellent compliance and control on current settings.  Receives benefit from use.  Understands proper care/use of device.  Aware of safe driving practices.  Patient Instructions  Continue to use CPAP every night, minimum of 4-6 hours a night.  Change equipment as directed. Wash your tubing with warm soap and water daily, hang to dry. Wash humidifier portion weekly. Use bottled, distilled water and change daily  Be aware of reduced alertness and do not drive or operate heavy machinery if experiencing this or drowsiness.  Exercise encouraged, as tolerated.'  Follow up in one year with Dr. Wynona Neat or Philis Nettle, or sooner, if needed

## 2022-12-10 NOTE — Patient Instructions (Signed)
Continue to use CPAP every night, minimum of 4-6 hours a night.  Change equipment as directed. Wash your tubing with warm soap and water daily, hang to dry. Wash humidifier portion weekly. Use bottled, distilled water and change daily  Be aware of reduced alertness and do not drive or operate heavy machinery if experiencing this or drowsiness.  Exercise encouraged, as tolerated.'  Follow up in one year with Dr. Wynona Neat or Philis Nettle, or sooner, if needed

## 2022-12-10 NOTE — Assessment & Plan Note (Signed)
BMI 45. Healthy weight loss encouraged.  

## 2022-12-10 NOTE — Addendum Note (Signed)
Addended by: Bennie Pierini on: 12/10/2022 11:51 AM   Modules accepted: Orders

## 2022-12-10 NOTE — Progress Notes (Signed)
@Patient  ID: [name] [name], female    DOB: Aug 08, 1970, 52 y.o.   MRN: [id number]  Chief Complaint  Patient presents with   Follow-up    Pt is here for CPAP Compliance. No problems per pt.    Referring provider: Bennie Pierini, *  HPI: 52 year old female, former smoker followed for severe OSA on CPAP. [name] a patient of Dr. Evlyn Courier and last seen in office 02/28/2022. Past medical history significant for SVT, migraines, chronic rhinitis, GERD, hypothyroidism, obesity, anxiety/panic attacks.  TEST/EVENTS:  2016 HST: AHI 92.2, SpO2 low 47%  02/01/2020: OV with Mack NP.  Follow-up for sleep apnea.  Excellent compliance with CPAP with good control of events.  Struggles with chronic rhinitis/vasomotor rhinitis.  Symptoms seem to get worse after eating.  Advised her to start on Astelin nasal spray.  Continued on Nasonex.  Can also start saline nasal rinses prior to nasal medications.  Follow-up 1 year.  02/28/2022: Ov with Alizae Bechtel NP for follow up. She has been doing great with her CPAP since she was here last. No pressure issues or problems with leaks. Wears it nightly without fail. She denies any excessive daytime fatigue, morning headaches, or drowsy driving. She has been having trouble with her sinuses for the past 3-4 weeks. She went to urgent care at the end of November and was treated with a short course of antibiotics. She's not sure what this was but says it was only for around 5 days or so. She did feel slightly better. Unfortunately since completing, her symptoms have returned. She has sinus pressure and feel like she can't breathe through her nose, especially at night. She has purulent drainage and some mild throat irritation from postnasal drip. She denies fevers, chills, headaches, GI symptoms. No interim sick exposures. She uses nasocort daily and takes Careers adviser. She has been evaluated by ENT in the past. Has a deviated septum but does not want to undergo repair as they told her she'd  have to be off her CPAP for 4 nights.  01/29/2022-02/27/2022: CPAP 5-20 cmH2O 30/30 days; 100% >4 hr; av use 9 hr 49 min Pressure 95th 13.4  Leaks 95th 3.2  AHI 0.1   12/10/2022: Today - follow up Patient presents today for follow up. She received a new CPAP machine after her last visit. Has been working well for her. [name] sleeping at night and wakes feeling rested for the most part. No excessive daytime sleepiness. Denies any drowsy driving or morning headaches. Mask fits well. Not having trouble with leaks.  08/09/2022-09/07/2022: CPAP 5-20 cmH2O 30/30 days; 100% >4 hr; average use 9 hr 43 min Pressure 95th 14.2 Leaks 95th 9.2 AHI 0.1  Allergies  Allergen Reactions   Chlorpheniramine Other (See Comments)   Pseudoephedrine Hcl Other (See Comments)    Immunization History  Administered Date(s) Administered   Influenza Inj Mdck Quad Pf 12/11/2018   Influenza Split 12/14/2014, 12/02/2019   Influenza,inj,Quad PF,6+ Mos 12/11/2015, 11/24/2021   Influenza-Unspecified 12/14/2014, 12/19/2016, 12/31/2017   Moderna Sars-Covid-2 Vaccination 05/20/2019, 06/17/2019, 01/07/2020   Pneumococcal Polysaccharide-23 09/17/2009   Tdap 08/24/1997, 10/10/2017    Past Medical History:  Diagnosis Date   Allergy    SEASONAL   Anxiety    Chronic headaches    Colon polyps    GERD (gastroesophageal reflux disease)    History of kidney stones    Hyperlipidemia    Hypothyroidism    Palpitations    Sleep apnea     Tobacco History: Social History  Tobacco Use  Smoking Status Former   Current packs/day: 0.00   Average packs/day: 0.5 packs/day for 10.1 years (5.0 ttl pk-yrs)   Types: Cigarettes   Start date: 03/12/2008   Quit date: 04/05/2018   Years since quitting: 4.6  Smokeless Tobacco Never   Counseling given: Not Answered   Outpatient Medications Prior to Visit  Medication Sig Dispense Refill   azelastine (ASTELIN) 0.1 % nasal spray Place 2 sprays into both nostrils 2 (two) times  daily. Use in each nostril as directed 30 mL 6   cholecalciferol (VITAMIN D3) 25 MCG (1000 UNIT) tablet Take 2,000 Units by mouth daily.     clonazePAM (KLONOPIN) 0.5 MG tablet Take 1 tablet (0.5 mg total) by mouth 2 (two) times daily as needed. 60 tablet 5   clotrimazole-betamethasone (LOTRISONE) cream APPLY TO THE AFFECTED AREA(S) topically DAILY 45 g 1   cyclobenzaprine (FLEXERIL) 10 MG tablet TAKE 2 TABLETS BY MOUTH AT BEDTIME 60 tablet 0   fexofenadine (ALLEGRA) 180 MG tablet Take 180 mg by mouth daily.     levothyroxine (SYNTHROID) 88 MCG tablet Take 1 tablet (88 mcg total) by mouth daily. 90 tablet 1   liothyronine (CYTOMEL) 5 MCG tablet TAKE 1 TABLET BY MOUTH EVERY DAY 90 tablet 1   magnesium oxide (MAG-OX) 400 (240 Mg) MG tablet Take 1 tablet (400 mg total) by mouth 2 (two) times daily. 90 tablet 1   metoCLOPramide (REGLAN) 10 MG tablet Take 1 tablet (10 mg total) by mouth daily. 90 tablet 1   mometasone (NASONEX) 50 MCG/ACT nasal spray INSTILL 2 SPRAYS IN EACH NOSTRIL EVERY DAY 17 g 5   naproxen (NAPROSYN) 500 MG tablet TAKE 1 TABLET BY MOUTH TWICE DAILY WITH A MEAL 60 tablet 1   Omega-3 Fatty Acids (FISH OIL PO) Take by mouth.     oxybutynin (DITROPAN-XL) 5 MG 24 hr tablet Take 1 tablet (5 mg total) by mouth at bedtime. 90 tablet 1   pantoprazole (PROTONIX) 40 MG tablet Take 1 tablet (40 mg total) by mouth 2 (two) times daily. 180 tablet 1   progesterone (PROMETRIUM) 100 MG capsule TAKE TWO CAPSULES BY MOUTH AT BEDTIME 90 capsule 0   propranolol (INDERAL) 80 MG tablet Take 1 tablet (80 mg total) by mouth daily. 90 tablet 1   No facility-administered medications prior to visit.     Review of Systems:   Constitutional: No weight loss or gain, night sweats, fevers, chills, fatigue, or lassitude. HEENT: No headaches, difficulty swallowing, tooth/dental problems, or sore throat. No sneezing, itching, ear ache,nasal congestion/drainage, post nasal drip, sinus pressure CV:  No chest  pain, orthopnea, PND, swelling in lower extremities, anasarca, dizziness, palpitations, syncope Resp: No shortness of breath with exertion or at rest. No excess mucus or change in color of mucus. No productive or non-productive. No hemoptysis. No wheezing.  No chest wall deformity GI:  No heartburn, indigestion Skin: No rash, lesions, ulcerations MSK:  No joint pain or swelling.  Neuro: No dizziness or lightheadedness.  Psych: No depression or anxiety. Mood stable.     Physical Exam:  BP 124/80 (BP Location: Right Arm, Cuff Size: Large)   Pulse 77   Ht 5\' 6"  (1.676 m)   Wt 279 lb (126.6 kg)   SpO2 94%   BMI 45.03 kg/m   GEN: Pleasant, interactive, well-appearing; morbidly obese; in no acute distress. HEENT:  Normocephalic and atraumatic. PERRLA. Sclera white. Nasal turbinates pink, moist and patent bilaterally. No rhinorrhea present. Oropharynx pink and  moist, without exudate or edema. No lesions, ulcerations NECK:  Supple w/ fair ROM. No JVD present. Normal carotid impulses w/o bruits. Thyroid symmetrical with no goiter or nodules palpated. No lymphadenopathy.   CV: RRR, no m/r/g, no peripheral edema. Pulses intact, +2 bilaterally. No cyanosis, pallor or clubbing. PULMONARY:  Unlabored, regular breathing. Clear bilaterally A&P w/o wheezes/rales/rhonchi. No accessory muscle use.  GI: BS present and normoactive. Soft, non-tender to palpation. No organomegaly or masses detected. MSK: No erythema, warmth or tenderness. Cap refil <2 sec all extrem. No deformities or joint swelling noted.  Neuro: A/Ox3. No focal deficits noted.   Skin: Warm, no lesions or rashe Psych: Normal affect and behavior. Judgement and thought content appropriate.     Lab Results:  CBC    Component Value Date/Time   WBC 9.7 06/07/2022 1242   WBC 11.2 (H) 01/07/2019 1407   RBC 5.13 06/07/2022 1242   RBC 5.22 (H) 01/07/2019 1407   HGB 14.9 06/07/2022 1242   HCT 45.3 06/07/2022 1242   PLT 302 06/07/2022  1242   MCV 88 06/07/2022 1242   MCH 29.0 06/07/2022 1242   MCH 28.4 01/07/2019 1407   MCHC 32.9 06/07/2022 1242   MCHC 32.2 01/07/2019 1407   RDW 13.9 06/07/2022 1242   LYMPHSABS 3.2 (H) 06/07/2022 1242   MONOABS 0.9 12/05/2011 1937   EOSABS 0.2 06/07/2022 1242   BASOSABS 0.1 06/07/2022 1242    BMET    Component Value Date/Time   NA 142 06/07/2022 1242   K 4.4 06/07/2022 1242   CL 103 06/07/2022 1242   CO2 23 06/07/2022 1242   GLUCOSE 92 06/07/2022 1242   GLUCOSE 95 01/07/2019 1407   BUN 11 06/07/2022 1242   CREATININE 0.81 06/07/2022 1242   CALCIUM 9.6 06/07/2022 1242   GFRNONAA 78 04/22/2020 1248   GFRAA 90 04/22/2020 1248    BNP No results found for: "BNP"   Imaging:  No results found.  Administration History     None           No data to display          No results found for: "NITRICOXIDE"      Assessment & Plan:   OSA (obstructive sleep apnea) Excellent compliance and control on current settings.  Receives benefit from use.  Understands proper care/use of device.  Aware of safe driving practices.  Patient Instructions  Continue to use CPAP every night, minimum of 4-6 hours a night.  Change equipment as directed. Wash your tubing with warm soap and water daily, hang to dry. Wash humidifier portion weekly. Use bottled, distilled water and change daily  Be aware of reduced alertness and do not drive or operate heavy machinery if experiencing this or drowsiness.  Exercise encouraged, as tolerated.'  Follow up in one year with Dr. Wynona Neat or Philis Nettle, or sooner, if needed    Morbid obesity (HCC) BMI 45. Healthy weight loss encouraged.   I spent 35 minutes of dedicated to the care of this patient on the date of this encounter to include pre-visit review of records, face-to-face time with the patient discussing conditions above, post visit ordering of testing, clinical documentation with the electronic health record, making appropriate  referrals as documented, and communicating necessary findings to members of the patients care team.  Noemi Chapel, NP 12/10/2022  Pt aware and understands NP's role.

## 2022-12-10 NOTE — Progress Notes (Signed)
Subjective:    Patient ID: [name] [name], female    DOB: 1971-02-05, 52 y.o.   MRN: [id number]   Chief Complaint: Medical Management of Chronic Issues    HPI:  [name] [name] is a 52 y.o. who identifies as a female who was assigned female at birth.   Social history: Lives with: mom and son Work history: [employer]   Comes in today for follow up of the following chronic medical issues:  1. SVT (supraventricular tachycardia) No recent palpitations or heart racing  2. Migraine with status migrainosus, not intractable, unspecified migraine type Has not had any in awhile  3. OSA (obstructive sleep apnea) Wears CPAP nightly. Sleeps well.  4. Gastroesophageal reflux disease without esophagitis Is on reglan and protonix and is dong better  5. Acquired hypothyroidism No issues that aware of. Lab Results  Component Value Date   TSH 3.180 06/07/2022     6. Panic attacks No recent panic attacks    06/07/2022   12:09 PM 04/27/2022   10:00 AM 11/24/2021    8:14 AM 05/15/2021    7:59 AM  GAD 7 : Generalized Anxiety Score  Nervous, Anxious, on Edge 0 0 0 0  Control/stop worrying 0 0 0 0  Worry too much - different things 0 0 0 0  Trouble relaxing 0 0 0 0  Restless 0 0 0 0  Easily annoyed or irritable 0 0 0 0  Afraid - awful might happen 0 0 0 0  Total GAD 7 Score 0 0 0 0  Anxiety Difficulty Not difficult at all Not difficult at all Not difficult at all Not difficult at all      7. Morbid obesity (HCC) No recent weight changes Wt Readings from Last 3 Encounters:  12/10/22 277 lb (125.6 kg)  09/04/22 277 lb (125.6 kg)  06/07/22 275 lb (124.7 kg)   BMI Readings from Last 3 Encounters:  12/10/22 44.71 kg/m  09/04/22 44.71 kg/m  06/07/22 44.39 kg/m      New complaints: None today  Allergies  Allergen Reactions   Chlorpheniramine Other (See Comments)   Pseudoephedrine Hcl Other (See Comments)   Outpatient Encounter Medications as of 12/10/2022  Medication Sig    cholecalciferol (VITAMIN D3) 25 MCG (1000 UNIT) tablet Take 2,000 Units by mouth daily.   clonazePAM (KLONOPIN) 0.5 MG tablet Take 1 tablet (0.5 mg total) by mouth 2 (two) times daily as needed.   clotrimazole-betamethasone (LOTRISONE) cream APPLY TO THE AFFECTED AREA(S) topically DAILY   cyclobenzaprine (FLEXERIL) 10 MG tablet TAKE 2 TABLETS BY MOUTH AT BEDTIME   fexofenadine (ALLEGRA) 180 MG tablet Take 180 mg by mouth daily.   levothyroxine (SYNTHROID) 88 MCG tablet TAKE 1 TABLET BY MOUTH DAILY   liothyronine (CYTOMEL) 5 MCG tablet TAKE 1 TABLET BY MOUTH EVERY DAY   magnesium oxide (MAG-OX) 400 (240 Mg) MG tablet TAKE 1 TABLET BY MOUTH TWICE DAILY   metoCLOPramide (REGLAN) 10 MG tablet Take 1 tablet (10 mg total) by mouth daily.   mometasone (NASONEX) 50 MCG/ACT nasal spray INSTILL 2 SPRAYS IN EACH NOSTRIL EVERY DAY   naproxen (NAPROSYN) 500 MG tablet TAKE 1 TABLET BY MOUTH TWICE DAILY WITH A MEAL   Omega-3 Fatty Acids (FISH OIL PO) Take by mouth.   oxybutynin (DITROPAN-XL) 5 MG 24 hr tablet TAKE 1 TABLET BY MOUTH AT BEDTIME   pantoprazole (PROTONIX) 40 MG tablet TAKE 1 TABLET BY MOUTH TWICE DAILY   progesterone (PROMETRIUM) 100 MG capsule TAKE TWO CAPSULES BY  MOUTH AT BEDTIME   propranolol (INDERAL) 80 MG tablet TAKE 1 TABLET BY MOUTH DAILY   azelastine (ASTELIN) 0.1 % nasal spray Place 2 sprays into both nostrils 2 (two) times daily. Use in each nostril as directed (Patient not taking: Reported on 12/10/2022)   [DISCONTINUED] doxycycline (VIBRA-TABS) 100 MG tablet Take 1 tablet (100 mg total) by mouth 2 (two) times daily.   No facility-administered encounter medications on file as of 12/10/2022.    Past Surgical History:  Procedure Laterality Date   CESAREAN SECTION     1194   COLONOSCOPY     KNEE ARTHROSCOPY     LITHOTRIPSY  12/2014   ROTATOR CUFF REPAIR Right 2021    Family History  Problem Relation Age of Onset   Hypertension Mother    Lung cancer Father        died from    Heart disease Maternal Grandmother    Heart disease Maternal Grandfather    Breast cancer Paternal Grandmother        paternal aunts x2   Colon cancer Neg Hx    Esophageal cancer Neg Hx    Rectal cancer Neg Hx    Stomach cancer Neg Hx    Colon polyps Neg Hx       Controlled substance contract: n/a     Review of Systems  Constitutional:  Negative for diaphoresis.  Eyes:  Negative for pain.  Respiratory:  Negative for shortness of breath.   Cardiovascular:  Negative for chest pain, palpitations and leg swelling.  Gastrointestinal:  Negative for abdominal pain.  Endocrine: Negative for polydipsia.  Skin:  Negative for rash.  Neurological:  Negative for dizziness, weakness and headaches.  Hematological:  Does not bruise/bleed easily.  All other systems reviewed and are negative.      Objective:   Physical Exam Vitals and nursing note reviewed.  Constitutional:      General: [name] not in acute distress.    Appearance: Normal appearance. [name] well-developed.  HENT:     Head: Normocephalic.     Right Ear: Tympanic membrane normal.     Left Ear: Tympanic membrane normal.     Nose: Nose normal.     Mouth/Throat:     Mouth: Mucous membranes are moist.  Eyes:     Pupils: Pupils are equal, round, and reactive to light.  Neck:     Vascular: No carotid bruit or JVD.  Cardiovascular:     Rate and Rhythm: Normal rate and regular rhythm.     Heart sounds: Normal heart sounds.  Pulmonary:     Effort: Pulmonary effort is normal. No respiratory distress.     Breath sounds: Normal breath sounds. No wheezing or rales.  Chest:     Chest wall: No tenderness.  Abdominal:     General: Bowel sounds are normal. There is no distension or abdominal bruit.     Palpations: Abdomen is soft. There is no hepatomegaly, splenomegaly, mass or pulsatile mass.     Tenderness: There is no abdominal tenderness.  Musculoskeletal:        General: Normal range of motion.     Cervical back:  Normal range of motion and neck supple.  Lymphadenopathy:     Cervical: No cervical adenopathy.  Skin:    General: Skin is warm and dry.  Neurological:     Mental Status: [name] alert and oriented to person, place, and time.     Deep Tendon Reflexes: Reflexes are normal and  symmetric.  Psychiatric:        Behavior: Behavior normal.        Thought Content: Thought content normal.        Judgment: Judgment normal.     BP 123/82   Pulse 61   Temp 97.7 F (36.5 C) (Temporal)   Resp 20   Ht 5\' 6"  (1.676 m)   Wt 277 lb (125.6 kg)   SpO2 96%   BMI 44.71 kg/m        Assessment & Plan:   Chandell Attridge comes in today with chief complaint of Medical Management of Chronic Issues   Diagnosis and orders addressed:  1. SVT (supraventricular tachycardia) Avoid caffeiene  2. Migraine with status migrainosus, not intractable, unspecified migraine type - propranolol (INDERAL) 80 MG tablet; Take 1 tablet (80 mg total) by mouth daily.  Dispense: 90 tablet; Refill: 1  3. OSA (obstructive sleep apnea) Continue to wear  CPAP  4. Gastroesophageal reflux disease without esophagitis Avoid spicy foods Do not eat 2 hours prior to bedtime - metoCLOPramide (REGLAN) 10 MG tablet; Take 1 tablet (10 mg total) by mouth daily.  Dispense: 90 tablet; Refill: 1 - pantoprazole (PROTONIX) 40 MG tablet; Take 1 tablet (40 mg total) by mouth 2 (two) times daily.  Dispense: 180 tablet; Refill: 1  5. Acquired hypothyroidism Labs pending - levothyroxine (SYNTHROID) 88 MCG tablet; Take 1 tablet (88 mcg total) by mouth daily.  Dispense: 90 tablet; Refill: 1  6. Panic attacks Stress management - clonazePAM (KLONOPIN) 0.5 MG tablet; Take 1 tablet (0.5 mg total) by mouth 2 (two) times daily as needed.  Dispense: 60 tablet; Refill: 5  7. Morbid obesity (HCC) Discussed diet and exercise for person with BMI >25 Will recheck weight in 3-6 months   8. Urinary urgency - oxybutynin (DITROPAN-XL) 5 MG 24 hr  tablet; Take 1 tablet (5 mg total) by mouth at bedtime.  Dispense: 90 tablet; Refill: 1   Labs pending Health Maintenance reviewed Diet and exercise encouraged  Follow up plan: 6 months   Mary-Margaret Daphine Deutscher, FNP

## 2022-12-10 NOTE — Patient Instructions (Signed)

## 2022-12-11 LAB — CMP14+EGFR
ALT: 42 [IU]/L — ABNORMAL HIGH (ref 0–32)
AST: 41 [IU]/L — ABNORMAL HIGH (ref 0–40)
Albumin: 4.2 g/dL (ref 3.9–4.9)
Alkaline Phosphatase: 68 [IU]/L (ref 44–121)
BUN/Creatinine Ratio: 15 (ref 9–23)
BUN: 12 mg/dL (ref 6–24)
Bilirubin Total: 0.4 mg/dL (ref 0.0–1.2)
CO2: 24 mmol/L (ref 20–29)
Calcium: 9.6 mg/dL (ref 8.7–10.2)
Chloride: 105 mmol/L (ref 96–106)
Creatinine, Ser: 0.82 mg/dL (ref 0.57–1.00)
Globulin, Total: 2.6 g/dL (ref 1.5–4.5)
Glucose: 80 mg/dL (ref 70–99)
Potassium: 4.6 mmol/L (ref 3.5–5.2)
Sodium: 142 mmol/L (ref 134–144)
Total Protein: 6.8 g/dL (ref 6.0–8.5)
eGFR: 87 mL/min/{1.73_m2} (ref >59)

## 2022-12-11 LAB — CBC WITH DIFFERENTIAL/PLATELET
Basophils Absolute: 0.1 10*3/uL (ref 0.0–0.2)
Basos: 1 %
EOS (ABSOLUTE): 0.2 10*3/uL (ref 0.0–0.4)
Eos: 2 %
Hematocrit: 44.7 % (ref 34.0–46.6)
Hemoglobin: 14.1 g/dL (ref 11.1–15.9)
Immature Grans (Abs): 0 10*3/uL (ref 0.0–0.1)
Immature Granulocytes: 0 %
Lymphocytes Absolute: 3.1 10*3/uL (ref 0.7–3.1)
Lymphs: 35 %
MCH: 29.7 pg (ref 26.6–33.0)
MCHC: 31.5 g/dL (ref 31.5–35.7)
MCV: 94 fL (ref 79–97)
Monocytes Absolute: 0.6 10*3/uL (ref 0.1–0.9)
Monocytes: 7 %
Neutrophils Absolute: 4.8 10*3/uL (ref 1.4–7.0)
Neutrophils: 55 %
Platelets: 268 10*3/uL (ref 150–450)
RBC: 4.75 x10E6/uL (ref 3.77–5.28)
RDW: 13.4 % (ref 11.7–15.4)
WBC: 8.8 10*3/uL (ref 3.4–10.8)

## 2022-12-11 LAB — LIPID PANEL
Chol/HDL Ratio: 3.7 {ratio} (ref 0.0–4.4)
Cholesterol, Total: 148 mg/dL (ref 100–199)
HDL: 40 mg/dL (ref >39)
LDL Chol Calc (NIH): 83 mg/dL (ref 0–99)
Triglycerides: 145 mg/dL (ref 0–149)
VLDL Cholesterol Cal: 25 mg/dL (ref 5–40)

## 2022-12-11 LAB — THYROID PANEL WITH TSH
Free Thyroxine Index: 1.4 (ref 1.2–4.9)
T3 Uptake Ratio: 22 % — ABNORMAL LOW (ref 24–39)
T4, Total: 6.3 ug/dL (ref 4.5–12.0)
TSH: 4.77 u[IU]/mL — ABNORMAL HIGH (ref 0.450–4.500)

## 2022-12-17 ENCOUNTER — Other Ambulatory Visit: Payer: Self-pay | Admitting: Nurse Practitioner

## 2022-12-17 DIAGNOSIS — M545 Low back pain, unspecified: Secondary | ICD-10-CM

## 2023-01-07 ENCOUNTER — Telehealth: Payer: Self-pay | Admitting: Nurse Practitioner

## 2023-01-07 ENCOUNTER — Other Ambulatory Visit: Payer: Self-pay

## 2023-01-07 MED ORDER — VALACYCLOVIR HCL 1 G PO TABS: 1000.0000 mg | ORAL_TABLET | Freq: Three times a day (TID) | ORAL | 1 refills | Status: DC

## 2023-01-07 NOTE — Telephone Encounter (Signed)
Pt requesting refill on Valtrex Rx she has taken in the past. Says she has a big fever blister on her lip that is very painful. Pt uses Counselling psychologist. Please advise.

## 2023-01-07 NOTE — Telephone Encounter (Signed)
Refill sent to pharmacy and patient notified.

## 2023-01-15 ENCOUNTER — Other Ambulatory Visit: Payer: Self-pay | Admitting: Nurse Practitioner

## 2023-01-15 DIAGNOSIS — M545 Low back pain, unspecified: Secondary | ICD-10-CM

## 2023-02-17 ENCOUNTER — Other Ambulatory Visit: Payer: Self-pay | Admitting: Nurse Practitioner

## 2023-02-17 DIAGNOSIS — M545 Low back pain, unspecified: Secondary | ICD-10-CM

## 2023-02-25 ENCOUNTER — Other Ambulatory Visit: Payer: Self-pay | Admitting: Nurse Practitioner

## 2023-03-17 ENCOUNTER — Other Ambulatory Visit: Payer: Self-pay | Admitting: Nurse Practitioner

## 2023-03-17 DIAGNOSIS — M545 Low back pain, unspecified: Secondary | ICD-10-CM

## 2023-04-16 ENCOUNTER — Other Ambulatory Visit: Payer: Self-pay | Admitting: Nurse Practitioner

## 2023-04-16 DIAGNOSIS — M545 Low back pain, unspecified: Secondary | ICD-10-CM

## 2023-04-18 ENCOUNTER — Ambulatory Visit (INDEPENDENT_AMBULATORY_CARE_PROVIDER_SITE_OTHER): Payer: No Typology Code available for payment source | Admitting: Nurse Practitioner

## 2023-04-18 ENCOUNTER — Encounter: Payer: Self-pay | Admitting: Nurse Practitioner

## 2023-04-18 VITALS — BP 130/85 | HR 63 | Temp 97.7°F | Ht 66.0 in | Wt 279.0 lb

## 2023-04-18 DIAGNOSIS — R0981 Nasal congestion: Secondary | ICD-10-CM

## 2023-04-18 DIAGNOSIS — J069 Acute upper respiratory infection, unspecified: Secondary | ICD-10-CM

## 2023-04-18 LAB — VERITOR FLU A/B WAIVED
Influenza A: NEGATIVE
Influenza B: NEGATIVE

## 2023-04-18 MED ORDER — CEFDINIR 300 MG PO CAPS: 300.0000 mg | ORAL_CAPSULE | Freq: Two times a day (BID) | ORAL | 0 refills | Status: DC

## 2023-04-18 MED ORDER — BENZONATATE 100 MG PO CAPS: 100.0000 mg | ORAL_CAPSULE | Freq: Two times a day (BID) | ORAL | 0 refills | Status: DC | PRN

## 2023-04-18 NOTE — Progress Notes (Signed)
 Subjective:    Patient ID: [name] [name], female    DOB: 08-12-1970, 53 y.o.   MRN: [id number]   Chief Complaint: Cough and Nasal Congestion   Cough This is a new problem. The current episode started in the past 7 days. The problem has been gradually improving. The problem occurs every few minutes. The cough is Productive of sputum. Associated symptoms include ear congestion, a fever and rhinorrhea. Pertinent negatives include no sore throat or shortness of breath. Nothing aggravates the symptoms. She has tried OTC cough suppressant for the symptoms. The treatment provided mild relief.    Patient Active Problem List   Diagnosis Date Noted   Acute bacterial rhinosinusitis 02/28/2022   Chronic rhinitis 02/01/2020   S/P right rotator cuff repair 04/16/2019   Gastroesophageal reflux disease without esophagitis 07/21/2018   Hidradenitis suppurativa 10/01/2016   OSA (obstructive sleep apnea) 05/13/2015   Morbid obesity (HCC) 05/13/2015   Hypothyroidism 03/27/2014   Panic attacks 03/27/2014   SVT (supraventricular tachycardia) (HCC) 06/26/2012   Migraines 06/26/2012       Review of Systems  Constitutional:  Positive for fever.  HENT:  Positive for congestion and rhinorrhea. Negative for sore throat.   Respiratory:  Positive for cough. Negative for shortness of breath.        Objective:   Physical Exam Constitutional:      Appearance: Normal appearance.  HENT:     Right Ear: Tympanic membrane normal. There is no impacted cerumen.     Left Ear: Tympanic membrane normal. There is no impacted cerumen.     Nose: Congestion and rhinorrhea present.     Mouth/Throat:     Pharynx: No oropharyngeal exudate or posterior oropharyngeal erythema.  Cardiovascular:     Rate and Rhythm: Normal rate and regular rhythm.     Heart sounds: Normal heart sounds.  Pulmonary:     Effort: Pulmonary effort is normal.     Breath sounds: Normal breath sounds.  Neurological:     General: No focal  deficit present.     Mental Status: [name] alert and oriented to person, place, and time.  Psychiatric:        Mood and Affect: Mood normal.        Behavior: Behavior normal.     BP 130/85   Pulse 63   Temp 97.7 F (36.5 C) (Temporal)   Ht 5' 6 (1.676 m)   Wt 279 lb (126.6 kg)   SpO2 95%   BMI 45.03 kg/m   Flu negative     Assessment & Plan:   [name] in today with chief complaint of Cough and Nasal Congestion   1. Nasal congestion (Primary) - Veritor Flu A/B Waived  2. URI with cough and congestion 1. Take meds as prescribed 2. Use a cool mist humidifier especially during the winter months and when heat has been humid. 3. Use saline nose sprays frequently 4. Saline irrigations of the nose can be very helpful if done frequently.  * 4X daily for 1 week*  * Use of a nettie pot can be helpful with this. Follow directions with this* 5. Drink plenty of fluids 6. Keep thermostat turn down low 7.For any cough or congestion- tessalon  prles 8. For fever or aces or pains- take tylenol  or ibuprofen  appropriate for age and weight.  * for fevers greater than 101 orally you may alternate ibuprofen  and tylenol  every  3 hours.    - cefdinir  (OMNICEF ) 300 MG capsule; Take 1  capsule (300 mg total) by mouth 2 (two) times daily. 1 po BID  Dispense: 20 capsule; Refill: 0 - benzonatate  (TESSALON ) 100 MG capsule; Take 1 capsule (100 mg total) by mouth 2 (two) times daily as needed for cough.  Dispense: 20 capsule; Refill: 0    The above assessment and management plan was discussed with the patient. The patient verbalized understanding of and has agreed to the management plan. Patient is aware to call the clinic if symptoms persist or worsen. Patient is aware when to return to the clinic for a follow-up visit. Patient educated on when it is appropriate to go to the emergency department.   Mary-Margaret Gladis, FNP

## 2023-04-18 NOTE — Patient Instructions (Signed)

## 2023-04-23 ENCOUNTER — Telehealth: Payer: Self-pay | Admitting: Nurse Practitioner

## 2023-04-23 DIAGNOSIS — Z0279 Encounter for issue of other medical certificate: Secondary | ICD-10-CM

## 2023-04-23 NOTE — Telephone Encounter (Signed)
Patient emailed FMLA forms to be completed.   Form Fee $29 paid Form given to HH/RX coordinator to complete

## 2023-04-24 NOTE — Telephone Encounter (Signed)
PCP completed and signed FMLA forms. They have been faxed to [facility] at fax number [phone]. Patient has been contacted and informed they are complete.

## 2023-05-16 ENCOUNTER — Other Ambulatory Visit: Payer: Self-pay | Admitting: Nurse Practitioner

## 2023-05-16 DIAGNOSIS — M545 Low back pain, unspecified: Secondary | ICD-10-CM

## 2023-05-16 DIAGNOSIS — G43901 Migraine, unspecified, not intractable, with status migrainosus: Secondary | ICD-10-CM

## 2023-05-28 ENCOUNTER — Other Ambulatory Visit: Payer: Self-pay | Admitting: Nurse Practitioner

## 2023-05-28 DIAGNOSIS — K219 Gastro-esophageal reflux disease without esophagitis: Secondary | ICD-10-CM

## 2023-06-11 ENCOUNTER — Ambulatory Visit (INDEPENDENT_AMBULATORY_CARE_PROVIDER_SITE_OTHER): Payer: Self-pay | Admitting: Nurse Practitioner

## 2023-06-11 VITALS — BP 117/74 | HR 69 | Temp 97.7°F | Ht 66.0 in | Wt 280.0 lb

## 2023-06-11 DIAGNOSIS — E039 Hypothyroidism, unspecified: Secondary | ICD-10-CM

## 2023-06-11 DIAGNOSIS — G43901 Migraine, unspecified, not intractable, with status migrainosus: Secondary | ICD-10-CM

## 2023-06-11 DIAGNOSIS — Z6841 Body Mass Index (BMI) 40.0 and over, adult: Secondary | ICD-10-CM

## 2023-06-11 DIAGNOSIS — I471 Supraventricular tachycardia, unspecified: Secondary | ICD-10-CM

## 2023-06-11 DIAGNOSIS — K219 Gastro-esophageal reflux disease without esophagitis: Secondary | ICD-10-CM

## 2023-06-11 DIAGNOSIS — R3915 Urgency of urination: Secondary | ICD-10-CM

## 2023-06-11 DIAGNOSIS — F410 Panic disorder [episodic paroxysmal anxiety] without agoraphobia: Secondary | ICD-10-CM

## 2023-06-11 LAB — LIPID PANEL

## 2023-06-11 MED ORDER — PROPRANOLOL HCL 80 MG PO TABS: 80.0000 mg | ORAL_TABLET | Freq: Every day | ORAL | 1 refills | Status: DC

## 2023-06-11 MED ORDER — PANTOPRAZOLE SODIUM 40 MG PO TBEC: 40.0000 mg | DELAYED_RELEASE_TABLET | Freq: Two times a day (BID) | ORAL | 1 refills | Status: DC

## 2023-06-11 MED ORDER — CLONAZEPAM 0.5 MG PO TABS: 0.5000 mg | ORAL_TABLET | Freq: Two times a day (BID) | ORAL | 5 refills | Status: DC | PRN

## 2023-06-11 MED ORDER — OXYBUTYNIN CHLORIDE ER 5 MG PO TB24: 5.0000 mg | ORAL_TABLET | Freq: Every day | ORAL | 1 refills | Status: DC

## 2023-06-11 MED ORDER — METOCLOPRAMIDE HCL 10 MG PO TABS: 10.0000 mg | ORAL_TABLET | Freq: Every day | ORAL | 1 refills | Status: DC

## 2023-06-11 MED ORDER — LEVOTHYROXINE SODIUM 88 MCG PO TABS: 88.0000 ug | ORAL_TABLET | Freq: Every day | ORAL | 1 refills | Status: DC

## 2023-06-11 NOTE — Progress Notes (Signed)
 Subjective:    Patient ID: [name] [name], female    DOB: March 17, 1970, 53 y.o.   MRN: [id number]   Chief Complaint: medical management of chronic issues      HPI:  [name] [name] is a 53 y.o. who identifies as a female who was assigned female at birth.   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 Migraine with status migrainosus, not intractable, unspecified migraine type Has had recent headaches. Was seen on 04/27/22 with frequent headache. She was given naprosyn to take as needed. They have improved. She went to chiropractor nad had neck adjusted and that seemed to help.  3. Gastroesophageal reflux disease without esophagitis Is on reglan and protonix daily. Has bad symptoms if does not take.  4 Acquired hypothyroidism No problems that she is aware of. Lab Results  Component Value Date   TSH 4.770 (H) 12/10/2022     5. Panic attacks Takes klonopin as needed    06/11/2023    8:14 AM 12/10/2022   11:36 AM 06/07/2022   12:09 PM 04/27/2022   10:00 AM  GAD 7 : Generalized Anxiety Score  Nervous, Anxious, on Edge 0 0 0 0  Control/stop worrying 0 0 0 0  Worry too much - different things 0 0 0 0  Trouble relaxing 0 0 0 0  Restless 0 0 0 0  Easily annoyed or irritable 0 0 0 0  Afraid - awful might happen 0 0 0 0  Total GAD 7 Score 0 0 0 0  Anxiety Difficulty Not difficult at all Not difficult at all Not difficult at all Not difficult at all     6. Morbid obesity (HCC) No recent weight changes   Wt Readings from Last 3 Encounters:  06/11/23 280 lb (127 kg)  04/18/23 279 lb (126.6 kg)  12/10/22 279 lb (126.6 kg)   BMI Readings from Last 3 Encounters:  06/11/23 45.19 kg/m  04/18/23 45.03 kg/m  12/10/22 45.03 kg/m        New  complaints: None today  Allergies  Allergen Reactions   Chlorpheniramine Other (See Comments)   Pseudoephedrine Hcl Other (See Comments)   Outpatient Encounter Medications as of 06/11/2023  Medication Sig   azelastine (ASTELIN) 0.1 % nasal spray Place 2 sprays into both nostrils 2 (two) times daily. Use in each nostril as directed   benzonatate (TESSALON) 100 MG capsule Take 1 capsule (100 mg total) by mouth 2 (two) times daily as needed for cough.   cefdinir (OMNICEF) 300 MG capsule Take 1 capsule (300 mg total) by mouth 2 (two) times daily. 1 po BID   cholecalciferol (VITAMIN D3) 25 MCG (1000 UNIT) tablet Take 2,000 Units by mouth daily.   clonazePAM (KLONOPIN) 0.5 MG tablet Take 1 tablet (0.5 mg total) by mouth 2 (two) times daily as needed.   clotrimazole-betamethasone (LOTRISONE) cream APPLY TO THE AFFECTED AREA(S) topically DAILY   cyclobenzaprine (FLEXERIL) 10 MG tablet TAKE 2 TABLETS BY MOUTH AT BEDTIME   fexofenadine (ALLEGRA) 180 MG tablet Take 180 mg by mouth daily.   levothyroxine (SYNTHROID) 88 MCG tablet Take 1 tablet (88 mcg total) by mouth daily.   liothyronine (CYTOMEL) 5 MCG tablet TAKE 1 TABLET BY MOUTH EVERY DAY   magnesium oxide (MAG-OX) 400 (  240 Mg) MG tablet Take 1 tablet (400 mg total) by mouth 2 (two) times daily.   metoCLOPramide (REGLAN) 10 MG tablet TAKE 1 TABLET BY MOUTH DAILY   mometasone (NASONEX) 50 MCG/ACT nasal spray INSTILL 2 SPRAYS IN EACH NOSTRIL EVERY DAY   naproxen (NAPROSYN) 500 MG tablet TAKE 1 TABLET BY MOUTH TWICE DAILY WITH A MEAL   Omega-3 Fatty Acids (FISH OIL PO) Take by mouth.   oxybutynin (DITROPAN-XL) 5 MG 24 hr tablet Take 1 tablet (5 mg total) by mouth at bedtime.   pantoprazole (PROTONIX) 40 MG tablet Take 1 tablet (40 mg total) by mouth 2 (two) times daily.   progesterone (PROMETRIUM) 100 MG capsule TAKE TWO CAPSULES BY MOUTH AT BEDTIME   propranolol (INDERAL) 80 MG tablet TAKE 1 TABLET BY MOUTH DAILY   valACYclovir (VALTREX) 1000 MG  tablet Take 1 tablet (1,000 mg total) by mouth 3 (three) times daily.   No facility-administered encounter medications on file as of 06/11/2023.    Past Surgical History:  Procedure Laterality Date   CESAREAN SECTION     1194   COLONOSCOPY     KNEE ARTHROSCOPY     LITHOTRIPSY  12/2014   ROTATOR CUFF REPAIR Right 2021    Family History  Problem Relation Age of Onset   Hypertension Mother    Lung cancer Father        died from   Heart disease Maternal Grandmother    Heart disease Maternal Grandfather    Breast cancer Paternal Grandmother        paternal aunts x2   Colon cancer Neg Hx    Esophageal cancer Neg Hx    Rectal cancer Neg Hx    Stomach cancer Neg Hx    Colon polyps Neg Hx       Controlled substance contract: n/a     Review of Systems  Constitutional:  Negative for diaphoresis.  Eyes:  Negative for pain.  Respiratory:  Negative for shortness of breath.   Cardiovascular:  Negative for chest pain, palpitations and leg swelling.  Gastrointestinal:  Negative for abdominal pain.  Endocrine: Negative for polydipsia.  Skin:  Negative for rash.  Neurological:  Negative for dizziness, weakness and headaches.  Hematological:  Does not bruise/bleed easily.  All other systems reviewed and are negative.      Objective:   Physical Exam Vitals and nursing note reviewed.  Constitutional:      General: [name] not in acute distress.    Appearance: Normal appearance. [name] well-developed.  HENT:     Head: Normocephalic.     Right Ear: Tympanic membrane normal.     Left Ear: Tympanic membrane normal.     Nose: Nose normal.     Mouth/Throat:     Mouth: Mucous membranes are moist.  Eyes:     Pupils: Pupils are equal, round, and reactive to light.  Neck:     Vascular: No carotid bruit or JVD.  Cardiovascular:     Rate and Rhythm: Normal rate and regular rhythm.     Heart sounds: Normal heart sounds.  Pulmonary:     Effort: Pulmonary effort is normal. No  respiratory distress.     Breath sounds: Normal breath sounds. No wheezing or rales.  Chest:     Chest wall: No tenderness.  Abdominal:     General: Bowel sounds are normal. There is no distension or abdominal bruit.     Palpations: Abdomen is soft. There is no hepatomegaly, splenomegaly,  mass or pulsatile mass.     Tenderness: There is no abdominal tenderness.  Musculoskeletal:        General: Normal range of motion.     Cervical back: Normal range of motion and neck supple.  Lymphadenopathy:     Cervical: No cervical adenopathy.  Skin:    General: Skin is warm and dry.  Neurological:     Mental Status: [name] alert and oriented to person, place, and time.     Deep Tendon Reflexes: Reflexes are normal and symmetric.  Psychiatric:        Behavior: Behavior normal.        Thought Content: Thought content normal.        Judgment: Judgment normal.    BP 117/74   Pulse 69   Temp 97.7 F (36.5 C) (Temporal)   Ht 5\' 6"  (1.676 m)   Wt 280 lb (127 kg)   SpO2 95%   BMI 45.19 kg/m          Assessment & Plan:  Muskaan Smet comes in today with chief complaint of Medical Management of Chronic Issues   Diagnosis and orders addressed:  1. SVT (supraventricular tachycardia) (HCC) (Primary) Avoid caffeine - CBC with Differential/Platelet - CMP14+EGFR - Lipid panel - levothyroxine (SYNTHROID) 88 MCG tablet; Take 1 tablet (88 mcg total) by mouth daily.  Dispense: 90 tablet; Refill: 1  2. Migraine with status migrainosus, not intractable, unspecified migraine type Avoid caffeine - VITAMIN D 25 Hydroxy (Vit-D Deficiency, Fractures) - propranolol (INDERAL) 80 MG tablet; Take 1 tablet (80 mg total) by mouth daily.  Dispense: 90 tablet; Refill: 1  3. Gastroesophageal reflux disease without esophagitis Avoid spicy foods Do not eat 2 hours prior to bedtime - metoCLOPramide (REGLAN) 10 MG tablet; Take 1 tablet (10 mg total) by mouth daily.  Dispense: 90 tablet; Refill: 1 -  pantoprazole (PROTONIX) 40 MG tablet; Take 1 tablet (40 mg total) by mouth 2 (two) times daily.  Dispense: 180 tablet; Refill: 1  4. Acquired hypothyroidism Labs pending - Thyroid Panel With TSH - levothyroxine (SYNTHROID) 88 MCG tablet; Take 1 tablet (88 mcg total) by mouth daily.  Dispense: 90 tablet; Refill: 1  5. Panic attacks Stress management - ToxASSURE Select 13 (MW), Urine - clonazePAM (KLONOPIN) 0.5 MG tablet; Take 1 tablet (0.5 mg total) by mouth 2 (two) times daily as needed.  Dispense: 60 tablet; Refill: 5  6. Morbid obesity (HCC) Discussed diet and exercise for person with BMI >25 Will recheck weight in 3-6 months   7. Urinary urgency - oxybutynin (DITROPAN-XL) 5 MG 24 hr tablet; Take 1 tablet (5 mg total) by mouth at bedtime.  Dispense: 90 tablet; Refill: 1   Labs pending Health Maintenance reviewed Diet and exercise encouraged  Follow up plan: 6 months   Mary-Margaret Daphine Deutscher, FNP

## 2023-06-11 NOTE — Addendum Note (Signed)
 Addended by: Bennie Pierini on: 06/11/2023 08:41 AM   Modules accepted: Orders

## 2023-06-11 NOTE — Patient Instructions (Signed)

## 2023-06-12 LAB — CMP14+EGFR
ALT: 57 IU/L — ABNORMAL HIGH (ref 0–32)
AST: 57 IU/L — ABNORMAL HIGH (ref 0–40)
Albumin: 4.5 g/dL (ref 3.8–4.9)
Alkaline Phosphatase: 74 IU/L (ref 44–121)
BUN/Creatinine Ratio: 17 (ref 9–23)
BUN: 15 mg/dL (ref 6–24)
Bilirubin Total: 0.4 mg/dL (ref 0.0–1.2)
CO2: 21 mmol/L (ref 20–29)
Calcium: 9.8 mg/dL (ref 8.7–10.2)
Chloride: 103 mmol/L (ref 96–106)
Creatinine, Ser: 0.87 mg/dL (ref 0.57–1.00)
Globulin, Total: 2.6 g/dL (ref 1.5–4.5)
Glucose: 133 mg/dL — ABNORMAL HIGH (ref 70–99)
Potassium: 4.5 mmol/L (ref 3.5–5.2)
Sodium: 143 mmol/L (ref 134–144)
Total Protein: 7.1 g/dL (ref 6.0–8.5)
eGFR: 81 mL/min/{1.73_m2} (ref >59)

## 2023-06-12 LAB — LIPID PANEL
Cholesterol, Total: 166 mg/dL (ref 100–199)
HDL: 41 mg/dL (ref >39)
LDL CALC COMMENT:: 4 ratio (ref 0.0–4.4)
LDL Chol Calc (NIH): 92 mg/dL (ref 0–99)
Triglycerides: 193 mg/dL — ABNORMAL HIGH (ref 0–149)
VLDL Cholesterol Cal: 33 mg/dL (ref 5–40)

## 2023-06-12 LAB — THYROID PANEL WITH TSH
Free Thyroxine Index: 1.3 (ref 1.2–4.9)
T3 Uptake Ratio: 21 % — ABNORMAL LOW (ref 24–39)
T4, Total: 6.4 ug/dL (ref 4.5–12.0)
TSH: 6.75 u[IU]/mL — ABNORMAL HIGH (ref 0.450–4.500)

## 2023-06-12 LAB — CBC WITH DIFFERENTIAL/PLATELET
Basophils Absolute: 0.1 10*3/uL (ref 0.0–0.2)
Basos: 1 %
EOS (ABSOLUTE): 0.3 10*3/uL (ref 0.0–0.4)
Eos: 3 %
Hematocrit: 46 % (ref 34.0–46.6)
Hemoglobin: 15.1 g/dL (ref 11.1–15.9)
Immature Grans (Abs): 0.1 10*3/uL (ref 0.0–0.1)
Immature Granulocytes: 1 %
Lymphocytes Absolute: 3.2 10*3/uL — ABNORMAL HIGH (ref 0.7–3.1)
Lymphs: 31 %
MCH: 29.5 pg (ref 26.6–33.0)
MCHC: 32.8 g/dL (ref 31.5–35.7)
MCV: 90 fL (ref 79–97)
Monocytes Absolute: 0.9 10*3/uL (ref 0.1–0.9)
Monocytes: 9 %
Neutrophils Absolute: 5.6 10*3/uL (ref 1.4–7.0)
Neutrophils: 55 %
Platelets: 332 10*3/uL (ref 150–450)
RBC: 5.12 x10E6/uL (ref 3.77–5.28)
RDW: 13.3 % (ref 11.7–15.4)
WBC: 10.1 10*3/uL (ref 3.4–10.8)

## 2023-06-13 LAB — TOXASSURE SELECT 13 (MW), URINE

## 2023-06-13 MED ORDER — LEVOTHYROXINE SODIUM 75 MCG PO TABS: 75.0000 ug | ORAL_TABLET | Freq: Every day | ORAL | 1 refills | Status: DC

## 2023-06-13 NOTE — Addendum Note (Signed)
 Addended by: Bennie Pierini on: 06/13/2023 08:57 AM   Modules accepted: Orders

## 2023-06-16 ENCOUNTER — Other Ambulatory Visit: Payer: Self-pay | Admitting: Nurse Practitioner

## 2023-06-24 ENCOUNTER — Other Ambulatory Visit: Payer: Self-pay | Admitting: Family

## 2023-06-24 NOTE — Telephone Encounter (Signed)
 Can we add A1C for elevated glucose.

## 2023-06-24 NOTE — Telephone Encounter (Signed)
A1c added and paper taken to lab

## 2023-06-25 ENCOUNTER — Other Ambulatory Visit: Payer: Self-pay | Admitting: Family Medicine

## 2023-06-25 ENCOUNTER — Telehealth: Payer: Self-pay

## 2023-06-25 DIAGNOSIS — R739 Hyperglycemia, unspecified: Secondary | ICD-10-CM

## 2023-06-25 NOTE — Telephone Encounter (Signed)
 PT can come back in and have A1C drawn. Please make sure she know she needs a lab appointment.

## 2023-06-25 NOTE — Telephone Encounter (Signed)
 Refer to my chart encounter.   Lab brought back the A1c add on paper and said it could not be added on.  Do you want her to come back in to have it done?

## 2023-06-25 NOTE — Telephone Encounter (Signed)
 Called and left detailed message for patient to make an appointment for labs orders have been placed

## 2023-07-25 ENCOUNTER — Other Ambulatory Visit: Payer: Self-pay | Admitting: Nurse Practitioner

## 2023-08-14 ENCOUNTER — Other Ambulatory Visit: Payer: Self-pay | Admitting: Nurse Practitioner

## 2023-08-28 ENCOUNTER — Ambulatory Visit (INDEPENDENT_AMBULATORY_CARE_PROVIDER_SITE_OTHER): Payer: Self-pay | Admitting: Surgical

## 2023-08-28 ENCOUNTER — Other Ambulatory Visit: Payer: Self-pay

## 2023-08-28 DIAGNOSIS — M25561 Pain in right knee: Secondary | ICD-10-CM

## 2023-09-01 ENCOUNTER — Encounter: Payer: Self-pay | Admitting: Surgical

## 2023-09-01 MED ORDER — TRIAMCINOLONE ACETONIDE 40 MG/ML IJ SUSP
40.0000 mg | INTRAMUSCULAR | Status: AC | PRN
  Administered 2023-08-28: 40 mg via INTRA_ARTICULAR

## 2023-09-01 MED ORDER — BUPIVACAINE HCL 0.25 % IJ SOLN
4.0000 mL | INTRAMUSCULAR | Status: AC | PRN
  Administered 2023-08-28: 4 mL via INTRA_ARTICULAR

## 2023-09-01 MED ORDER — LIDOCAINE HCL 1 % IJ SOLN
5.0000 mL | INTRAMUSCULAR | Status: AC | PRN
  Administered 2023-08-28: 5 mL

## 2023-09-01 NOTE — Progress Notes (Signed)
 Office Visit Note   Patient: [name] [name]           Date of Birth: Jun 14, 1970           MRN: [id number] Visit Date: 08/28/2023 Requested by: Gladis Mustard, FNP [address] [place],  [place] [address] PCP: Gladis Mustard, FNP  Subjective: Chief Complaint  Patient presents with   Right Knee - Pain    HPI: [name] [name] is a 53 y.o. female who presents to the office reporting right knee pain.  Patient describes right knee pain with no known injury aside from a twinge of discomfort in her right knee when she was bending over to wash her hair.  It has been mostly localizing to the medial aspect of the knee.  Worsening over the last few weeks with her worst pain on Saturday and it caused her to have difficulty standing for any lengthy period of time and she had to sit down multiple times while she was cooking a meal.  Denies any weakness.  It is difficult to sleep due to pain.  Has no history of right knee surgery but has had 1 prior left knee surgery with Dr. Anderson.  Has tried ice, stretching, Aleve  without much relief.  She does have some mechanical clicking sensation in the knee that is new since this event 2 weeks ago.  No locking.  Has history of sleep apnea and thyroid  disease but no history of diabetes or DVT/PE..                ROS: All systems reviewed are negative as they relate to the chief complaint within the history of present illness.  Patient denies fevers or chills.  Assessment & Plan: Visit Diagnoses:  1. Acute pain of right knee     Plan: Patient is a 53 year old female who presents for evaluation of right knee pain.  Patient has had pain worsening over the last 2 weeks since some discomfort that she noticed as soon as she bent over to wash her hair.  It is causing her to have difficulty with ADLs or standing for long periods of time.  She starts a new job at [employer] next week and this will involve a lot of standing and walking for 8 to 10 hours straight.   Radiographs negative for any acute abnormality.  We discussed options such as MRI versus injection.  She would like to try injection today.  Injection administered and patient tolerated procedure well without complication.  Follow-Up Instructions: Return in about 4 weeks (around 09/25/2023).   Orders:  Orders Placed This Encounter  Procedures   XR KNEE 3 VIEW RIGHT   No orders of the defined types were placed in this encounter.     Procedures: Large Joint Inj: R knee on 08/28/2023 3:49 PM Indications: diagnostic evaluation, joint swelling and pain Details: 18 G 1.5 in needle, superolateral approach  Arthrogram: No  Medications: 5 mL lidocaine  1 %; 4 mL bupivacaine  0.25 %; 40 mg triamcinolone acetonide 40 MG/ML Outcome: tolerated well, no immediate complications Procedure, treatment alternatives, risks and benefits explained, specific risks discussed. Consent was given by the patient. Immediately prior to procedure a time out was called to verify the correct patient, procedure, equipment, support staff and site/side marked as required. Patient was prepped and draped in the usual sterile fashion.       Clinical Data: No additional findings.  Objective: Vital Signs: There were no vitals taken for this visit.  Physical Exam:  Constitutional: Patient appears well-developed HEENT:  Head: Normocephalic Eyes:EOM are normal Neck: Normal range of motion Cardiovascular: Normal rate Pulmonary/chest: Effort normal Neurologic: Patient is alert Skin: Skin is warm Psychiatric: Patient has normal mood and affect  Ortho Exam: Ortho exam demonstrates right knee with trace effusion.  Tenderness over the medial joint line.  No tenderness over the lateral joint line.  No calf tenderness.  Negative Homans' sign.  No pain with hip range of motion.  Able to perform straight leg raise.  Stable to anterior posterior drawer sign.  No cellulitis or skin changes noted.  Positive McMurray's  sign.  Specialty Comments:  No specialty comments available.  Imaging: No results found.   PMFS History: Patient Active Problem List   Diagnosis Date Noted   Chronic rhinitis 02/01/2020   S/P right rotator cuff repair 04/16/2019   Gastroesophageal reflux disease without esophagitis 07/21/2018   Hidradenitis suppurativa 10/01/2016   OSA (obstructive sleep apnea) 05/13/2015   Morbid obesity (HCC) 05/13/2015   Hypothyroidism 03/27/2014   Panic attacks 03/27/2014   SVT (supraventricular tachycardia) (HCC) 06/26/2012   Migraines 06/26/2012   Past Medical History:  Diagnosis Date   Allergy    SEASONAL   Anxiety    Chronic headaches    Colon polyps    GERD (gastroesophageal reflux disease)    History of kidney stones    Hyperlipidemia    Hypothyroidism    Palpitations    Sleep apnea     Family History  Problem Relation Age of Onset   Hypertension Mother    Lung cancer Father        died from   Heart disease Maternal Grandmother    Heart disease Maternal Grandfather    Breast cancer Paternal Grandmother        paternal aunts x2   Colon cancer Neg Hx    Esophageal cancer Neg Hx    Rectal cancer Neg Hx    Stomach cancer Neg Hx    Colon polyps Neg Hx     Past Surgical History:  Procedure Laterality Date   CESAREAN SECTION     1194   COLONOSCOPY     KNEE ARTHROSCOPY     LITHOTRIPSY  12/2014   ROTATOR CUFF REPAIR Right 2021   Social History   Occupational History    Employer: [employer]  Tobacco Use   Smoking status: Former    Current packs/day: 0.00    Average packs/day: 0.5 packs/day for 10.1 years (5.0 ttl pk-yrs)    Types: Cigarettes    Start date: 03/12/2008    Quit date: 04/05/2018    Years since quitting: 5.4   Smokeless tobacco: Never  Vaping Use   Vaping status: Never Used  Substance and Sexual Activity   Alcohol use: Yes    Alcohol/week: 0.0 standard drinks of alcohol    Comment: rare   Drug use: No   Sexual activity: Not on file

## 2023-09-08 ENCOUNTER — Encounter (HOSPITAL_COMMUNITY): Payer: Self-pay | Admitting: Emergency Medicine

## 2023-09-08 ENCOUNTER — Emergency Department (HOSPITAL_COMMUNITY)
Admission: EM | Admit: 2023-09-08 | Discharge: 2023-09-08 | Disposition: A | Discharge status: Home / Self Care | Payer: Self-pay | Attending: Emergency Medicine | Admitting: Emergency Medicine

## 2023-09-08 ENCOUNTER — Emergency Department (HOSPITAL_COMMUNITY): Payer: Self-pay

## 2023-09-08 ENCOUNTER — Other Ambulatory Visit: Payer: Self-pay

## 2023-09-08 DIAGNOSIS — M25561 Pain in right knee: Secondary | ICD-10-CM | POA: Insufficient documentation

## 2023-09-08 MED ORDER — HYDROCODONE-ACETAMINOPHEN 5-325 MG PO TABS: 2.0000 | ORAL_TABLET | Freq: Four times a day (QID) | ORAL | 0 refills | Status: DC | PRN

## 2023-09-08 MED ORDER — HYDROCODONE-ACETAMINOPHEN 5-325 MG PO TABS
1.0000 | ORAL_TABLET | Freq: Once | ORAL | Status: AC
  Administered 2023-09-08: 1 via ORAL
  Filled 2023-09-08: qty 1

## 2023-09-08 NOTE — Discharge Instructions (Signed)
 Today you were seen for right knee pain.  You may take Tylenol  Motrin  as needed for mild to moderate pain and Norco as needed for severe pain.  Please follow-up with Dr. Onesimo with orthopedics if your symptoms persist for further evaluation workup.  Thank you for letting us  treat you today. After reviewing your imaging, I feel you are safe to go home. Please follow up with your PCP in the next several days and provide them with your records from this visit. Return to the Emergency Room if pain becomes severe or symptoms worsen.

## 2023-09-08 NOTE — ED Triage Notes (Signed)
 Pt via POV c/o severe right knee pain. She has had pain for weeks but yesterday she felt a pop while walking and now her pain is much more intense. Pain rated 10/10. Aleve  PTA

## 2023-09-08 NOTE — ED Provider Notes (Signed)
 [place] EMERGENCY DEPARTMENT AT [facility] Provider Note   CSN: [id number] Arrival date & time: 09/08/23  1506     Patient presents with: Knee Pain   [name] [name] is a 53 y.o. female presents today for right knee pain.  Patient states she has had the pain for weeks, however yesterday it felt a popping sensation while walking and now her pain is much more intense.  Patient was seen at [facility] in [place] on 6/18 and given a joint injection.  Patient denies any fall/trauma, history of surgery, numbness, tingling, fever, chills, erythema, warmth, or swelling.    Knee Pain      Prior to Admission medications   Medication Sig Start Date End Date Taking? Authorizing Provider  HYDROcodone-acetaminophen  (NORCO/VICODIN) 5-325 MG tablet Take 2 tablets by mouth every 6 (six) hours as needed for severe pain (pain score 7-10). 09/08/23  Yes Kira Hartl N, PA-C  azelastine  (ASTELIN ) 0.1 % nasal spray Place 2 sprays into both nostrils 2 (two) times daily. Use in each nostril as directed 09/04/22   Lavell Lye A, FNP  cholecalciferol (VITAMIN D3) 25 MCG (1000 UNIT) tablet Take 2,000 Units by mouth daily.    [provider]  clonazePAM  (KLONOPIN ) 0.5 MG tablet Take 1 tablet (0.5 mg total) by mouth 2 (two) times daily as needed. 06/11/23   Gladis Mustard, FNP  cyclobenzaprine  (FLEXERIL ) 10 MG tablet TAKE 2 TABLETS BY MOUTH AT BEDTIME 05/16/23   Gladis, Mary-Margaret, FNP  fexofenadine (ALLEGRA) 180 MG tablet Take 180 mg by mouth daily.    [provider]  levothyroxine  (SYNTHROID ) 75 MCG tablet Take 1 tablet (75 mcg total) by mouth daily. 06/13/23   Gladis Mustard, FNP  liothyronine  (CYTOMEL ) 5 MCG tablet TAKE 1 TABLET BY MOUTH EVERY DAY 08/19/23   Gladis, Mary-Margaret, FNP  magnesium  oxide (MAG-OX) 400 (240 Mg) MG tablet Take 1 tablet (400 mg total) by mouth 2 (two) times daily. 12/10/22   Gladis, Mary-Margaret, FNP  metoCLOPramide  (REGLAN ) 10 MG tablet Take 1  tablet (10 mg total) by mouth daily. 06/11/23   Gladis Mary-Margaret, FNP  mometasone  (NASONEX ) 50 MCG/ACT nasal spray INSTILL 2 SPRAYS IN EACH NOSTRIL EVERY DAY 05/16/23   Gladis Mustard, FNP  naproxen  (NAPROSYN ) 500 MG tablet TAKE 1 TABLET BY MOUTH TWICE DAILY WITH A MEAL 05/16/23   Gladis, Mary-Margaret, FNP  Omega-3 Fatty Acids (FISH OIL PO) Take by mouth.    [provider]  oxybutynin  (DITROPAN -XL) 5 MG 24 hr tablet Take 1 tablet (5 mg total) by mouth at bedtime. 06/11/23   Gladis Mary-Margaret, FNP  pantoprazole  (PROTONIX ) 40 MG tablet Take 1 tablet (40 mg total) by mouth 2 (two) times daily. 06/11/23   Gladis Mustard, FNP  progesterone (PROMETRIUM) 100 MG capsule TAKE TWO CAPSULES BY MOUTH AT BEDTIME 07/25/23   Gladis, Mary-Margaret, FNP  propranolol  (INDERAL ) 80 MG tablet Take 1 tablet (80 mg total) by mouth daily. 06/11/23   Gladis, Mary-Margaret, FNP  valACYclovir  (VALTREX ) 1000 MG tablet Take 1 tablet (1,000 mg total) by mouth 3 (three) times daily. 01/07/23   Gladis Mary-Margaret, FNP    Allergies: Chlorpheniramine and Pseudoephedrine  hcl    Review of Systems  Musculoskeletal:  Positive for arthralgias.    Updated Vital Signs BP (!) 153/90 (BP Location: Right Arm)   Pulse 72   Temp 98.1 F (36.7 C)   Resp 17   Ht 5' 6 (1.676 m)   Wt 122.5 kg   LMP 01/28/2011   SpO2  96%   BMI 43.58 kg/m   Physical Exam Vitals and nursing note reviewed.  Constitutional:      General: [name] not in acute distress.    Appearance: [name] well-developed.  HENT:     Head: Normocephalic and atraumatic.   Eyes:     Conjunctiva/sclera: Conjunctivae normal.    Cardiovascular:     Rate and Rhythm: Normal rate and regular rhythm.     Heart sounds: No murmur heard. Pulmonary:     Effort: Pulmonary effort is normal. No respiratory distress.     Breath sounds: Normal breath sounds.  Abdominal:     Palpations: Abdomen is soft.     Tenderness: There is no abdominal  tenderness.   Musculoskeletal:        General: Tenderness present. No swelling or deformity.     Cervical back: Neck supple.     Comments: Patient has mild tenderness to palpation of the right medial knee.  Patient has no laxity noted to ACL, PCL, MCL, or LCL.  No crepitus noted on exam.  No erythema, ecchymosis, edema, or warmth noted.  Patient has no tenderness to palpation of the posterior knee or right calf.   Skin:    General: Skin is warm and dry.     Capillary Refill: Capillary refill takes less than 2 seconds.   Neurological:     Mental Status: [name] alert.   Psychiatric:        Mood and Affect: Mood normal.     (all labs ordered are listed, but only abnormal results are displayed) Labs Reviewed - No data to display  EKG: None  Radiology: DG Knee Complete 4 Views Right Result Date: 09/08/2023 CLINICAL DATA:  855384 Pain [id number] EXAM: RIGHT KNEE - COMPLETE 4+ VIEW COMPARISON:  August 28, 2023 FINDINGS: No acute fracture or dislocation. No joint effusion. Mild joint space loss in the patellofemoral compartment. Soft tissues are unremarkable. IMPRESSION: 1. No acute fracture or dislocation. 2. Mild osteoarthritis in the patellofemoral compartment of the knee. Electronically Signed   By: Rogelia Myers M.D.   On: 09/08/2023 15:46     Procedures   Medications Ordered in the ED  HYDROcodone-acetaminophen  (NORCO/VICODIN) 5-325 MG per tablet 1 tablet (has no administration in time range)                                    Medical Decision Making Amount and/or Complexity of Data Reviewed Radiology: ordered.   This patient presents to the ED for concern of right knee pain differential diagnosis includes musculoskeletal pain, fracture, ligamentous injury, gout, septic arthritis   Imaging Studies ordered:  I ordered imaging studies including right knee x-ray I independently visualized and interpreted imaging which showed No acute fracture or dislocation.  Mild  osteoarthritis in the patellofemoral compartment of the knee I agree with the radiologist interpretation   Medicines ordered and prescription drug management:  I ordered medication including Norco    I have reviewed the patients home medicines and have made adjustments as needed   Problem List / ED Course:  Patient placed in hinged knee brace Considered for admission further workup however patient's vital signs, physical exam, and imaging of been reassuring.  Patient has no signs or symptoms concerning for gout or septic arthritis.  Patient advised to take Tylenol /Motrin  for mild to moderate pain and Norco for severe pain.  Patient to follow-up with orthopedics  if her symptoms persist for further evaluation workup.  Patient given return precautions.  I feel patient is safe for discharge at this time.     Final diagnoses:  Acute pain of right knee    ED Discharge Orders          Ordered    HYDROcodone-acetaminophen  (NORCO/VICODIN) 5-325 MG tablet  Every 6 hours PRN        09/08/23 1554               Francis Ileana SAILOR, PA-C 09/08/23 1558    Suzette Pac, MD 09/09/23 [phone]

## 2023-09-09 ENCOUNTER — Other Ambulatory Visit: Payer: Self-pay | Admitting: Radiology

## 2023-09-09 ENCOUNTER — Telehealth: Payer: Self-pay | Admitting: Orthopaedic Surgery

## 2023-09-09 DIAGNOSIS — M25561 Pain in right knee: Secondary | ICD-10-CM

## 2023-09-09 NOTE — Telephone Encounter (Signed)
 noted

## 2023-09-09 NOTE — Telephone Encounter (Signed)
 DR. BRENNA   Patient called lvm this is a call back to [place] she is self pay no insurance and she has her MRI scheduled 7/7  you can call her back at [phone]

## 2023-09-12 ENCOUNTER — Ambulatory Visit: Payer: Self-pay | Admitting: Orthopaedic Surgery

## 2023-09-16 ENCOUNTER — Ambulatory Visit (HOSPITAL_COMMUNITY)
Admission: RE | Admit: 2023-09-16 | Discharge: 2023-09-16 | Disposition: A | Discharge status: Home / Self Care | Payer: Self-pay | Source: Ambulatory Visit | Attending: Surgical | Admitting: Surgical

## 2023-09-16 DIAGNOSIS — M25561 Pain in right knee: Secondary | ICD-10-CM | POA: Diagnosis present

## 2023-09-16 DIAGNOSIS — S83271A Complex tear of lateral meniscus, current injury, right knee, initial encounter: Secondary | ICD-10-CM | POA: Diagnosis not present

## 2023-09-16 DIAGNOSIS — M948X6 Other specified disorders of cartilage, lower leg: Secondary | ICD-10-CM | POA: Diagnosis not present

## 2023-09-16 DIAGNOSIS — M25461 Effusion, right knee: Secondary | ICD-10-CM | POA: Diagnosis not present

## 2023-09-16 DIAGNOSIS — M7121 Synovial cyst of popliteal space [Baker], right knee: Secondary | ICD-10-CM | POA: Diagnosis not present

## 2023-09-17 ENCOUNTER — Telehealth: Payer: Self-pay

## 2023-09-17 NOTE — Telephone Encounter (Signed)
 I called [name].  On the axial view of the MRI scan she has a pretty significant radial tear involving about 70% of the meniscal width.  She spoke start working at [employer] standing 12 hours a day in mid August.  I do not think she can really do that as she is right now and I do not think that she will be able to do that even after we go in and debride that meniscal tear.  She does have increased BMI at 270 pounds of weight.  So she is really in between a rock and a hard place.  She would like to come in to [facility] for a visit.  Can you set that up for sometime in the near future.  I suggested that she may do better with sitdown type work until until she decides about her knee situation.  Definitely on the young side for knee replacement and I think her BMI would be too high for that as well.  Thanks

## 2023-09-17 NOTE — Telephone Encounter (Signed)
 Please review scan and advise on appt for patient vs calling her? She has no insurance after August and would like to get something done ASAP.

## 2023-09-25 ENCOUNTER — Ambulatory Visit: Payer: Self-pay | Admitting: Orthopedic Surgery

## 2023-09-25 ENCOUNTER — Other Ambulatory Visit: Payer: Self-pay | Admitting: Nurse Practitioner

## 2023-09-25 DIAGNOSIS — M545 Low back pain, unspecified: Secondary | ICD-10-CM

## 2023-10-10 ENCOUNTER — Ambulatory Visit (INDEPENDENT_AMBULATORY_CARE_PROVIDER_SITE_OTHER): Payer: Self-pay | Admitting: Nurse Practitioner

## 2023-10-10 ENCOUNTER — Encounter: Payer: Self-pay | Admitting: Nurse Practitioner

## 2023-10-10 VITALS — BP 125/80 | HR 70 | Temp 97.5°F | Ht 66.0 in | Wt 275.0 lb

## 2023-10-10 DIAGNOSIS — J0100 Acute maxillary sinusitis, unspecified: Secondary | ICD-10-CM | POA: Diagnosis not present

## 2023-10-10 DIAGNOSIS — M25561 Pain in right knee: Secondary | ICD-10-CM | POA: Diagnosis not present

## 2023-10-10 MED ORDER — AMOXICILLIN-POT CLAVULANATE 875-125 MG PO TABS: 1.0000 | ORAL_TABLET | Freq: Two times a day (BID) | ORAL | 0 refills | Status: DC

## 2023-10-10 NOTE — Patient Instructions (Signed)
 Acute Knee Pain, Adult Many things can cause knee pain. Sometimes, knee pain is sudden (acute). It may be caused by damage, swelling, or irritation of the muscles and tissues that support your knee. Pain may come from: A fall. An injury to the knee from twisting motions. A hit to the knee. Infection. The pain often goes away on its own with time and rest. If the pain does not go away, tests may be done to find out what is causing the pain. These may include: Imaging tests, such as an X-ray, MRI, CT scan, or ultrasound. Joint aspiration. In this test, fluid is removed from the knee and checked. Arthroscopy. In this test, a lighted tube is put in the knee and an image is shown on a screen. A biopsy. In this test, a health care provider will remove a small piece of tissue for testing. Follow these instructions at home: If you have a knee sleeve or brace that can be taken off:  Wear the knee sleeve or brace as told by your provider. Take it off only if your provider says that you can. Check the skin around it every day. Tell your provider if you see problems. Loosen the knee sleeve or brace if your toes tingle, are numb, or turn cold and blue. Keep the knee sleeve or brace clean and dry. Bathing If the knee sleeve or brace is not waterproof: Do not let it get wet. Cover it when you take a bath or shower. Use a cover that does not let any water in. Managing pain, stiffness, and swelling  If told, put ice on the area. If you have a knee sleeve or brace that you can take off, remove it as told. Put ice in a plastic bag. Place a towel between your skin and the bag. Leave the ice on for 20 minutes, 2-3 times a day. If your skin turns bright red, take off the ice right away to prevent skin damage. The risk of damage is higher if you cannot feel pain, heat, or cold. Move your toes often to reduce stiffness and swelling. Raise the injured area above the level of your heart while you are sitting  or lying down. Use a pillow to support your foot as needed. If told, use an elastic bandage to put pressure (compression) on your injured knee. This may control swelling, give support, and help with discomfort. Sleep with a pillow under your knee. Activity Rest your knee. Do not do things that cause pain or make pain worse. Do not stand or walk on your injured knee until you're told it's okay. Use crutches as told. Avoid activities where both feet leave the ground at the same time and put stress on the joints. Avoid running, jumping rope, and doing jumping jacks. Work with a physical therapist to make a safe exercise program if told. Physical therapy helps your knee move better and get stronger. Exercise as told. General instructions Take your medicines only as told by your provider. If you are overweight, work with your provider and an expert in healthy eating, called a dietician, to set goals to lose weight. Being overweight can make your knee hurt more. Do not smoke, vape, or use products with nicotine or tobacco in them. If you need help quitting, talk with your provider. Return to normal activities when you are told. Ask what things are safe for you to do. Watch for any changes in your symptoms. Keep all follow-up visits. Your provider will check  your healing and adjust treatments if needed. Contact a health care provider if: The knee pain does not stop. The knee pain changes or gets worse. You have a fever along with knee pain. Your knee is red or feels warm when you touch it. Your knee gives out or locks up. Get help right away if: Your knee swells and the swelling gets worse. You cannot move your knee. You have very bad knee pain that does not get better with medicine. This information is not intended to replace advice given to you by your health care provider. Make sure you discuss any questions you have with your health care provider. Document Revised: 11/29/2022 Document  Reviewed: 04/23/2022 Elsevier Patient Education  2024 [employer].

## 2023-10-10 NOTE — Progress Notes (Signed)
 Subjective:    Patient ID: [name] [name], female    DOB: 10/02/1970, 54 y.o.   MRN: [id number]   Chief Complaint: knee pain  Sinus Problem Pertinent negatives include no diaphoresis, headaches or shortness of breath.   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 A few weeks later she was walking and felt something tear in her knee. She went to the ED and had MRI and was dx with menisus tear. She now needs referral to ortho. She has appointment with orth next week. Just needs referral. - Nasal congestion and facial pressure started 5 days ago. Allergy meds no help. She says her teeth even hurt when she eats.   Patient Active Problem List   Diagnosis Date Noted   Chronic rhinitis 02/01/2020   S/P right rotator cuff repair 04/16/2019   Gastroesophageal reflux disease without esophagitis 07/21/2018   Hidradenitis suppurativa 10/01/2016   OSA (obstructive sleep apnea) 05/13/2015   Morbid obesity (HCC) 05/13/2015   Hypothyroidism 03/27/2014   Panic attacks 03/27/2014   SVT (supraventricular tachycardia) (HCC) 06/26/2012   Migraines 06/26/2012       Review of Systems  Constitutional:  Negative for diaphoresis.  Eyes:  Negative for pain.  Respiratory:  Negative for shortness of breath.   Cardiovascular:  Negative for chest pain, palpitations and leg swelling.  Gastrointestinal:  Negative for abdominal pain.  Endocrine: Negative for polydipsia.  Skin:  Negative for rash.  Neurological:  Negative for dizziness, weakness and headaches.  Hematological:  Does not bruise/bleed easily.  All other systems reviewed and are negative.      Objective:   Physical Exam Constitutional:      Appearance: Normal appearance. [name] obese.  HENT:     Right Ear: Tympanic membrane normal.     Left Ear: Tympanic membrane normal.     Nose: Congestion and rhinorrhea present.     Right Sinus: Maxillary sinus tenderness present.  No frontal sinus tenderness.     Left Sinus: Maxillary sinus tenderness present. No frontal sinus tenderness.     Mouth/Throat:     Pharynx: No posterior oropharyngeal erythema.  Cardiovascular:     Rate and Rhythm: Normal rate and regular rhythm.     Heart sounds: Normal heart sounds.  Pulmonary:     Breath sounds: Normal breath sounds.  Musculoskeletal:     Cervical back: Normal range of motion and neck supple.     Comments: Elastic Knee brace in place right knee Mild right knee effusion Crepitus on flexion and extension  Neurological:     General: No focal deficit present.     Mental Status: [name] alert and oriented to person, place, and time.  Psychiatric:        Mood and Affect: Mood normal.        Behavior: Behavior normal.     BP 125/80   Pulse 70   Temp (!) 97.5 F (36.4 C) (Temporal)   Ht 5' 6 (1.676 m)   Wt 275 lb (124.7 kg)   LMP 01/28/2011   SpO2 95%   BMI 44.39 kg/m        Assessment & Plan:  [name] [name] in today with chief complaint of Needs referal to ortho and Sinus Problem   1. Acute pain of right knee (Primary) Continue to wear wrap Elevate when sitting Ice if helps Keep appt with ortho - Ambulatory referral to Orthopedic Surgery  2.  Acute non-recurrent maxillary sinusitis 1. Take meds as prescribed 2. Use a cool mist humidifier especially during the winter months and when heat has been humid. 3. Use saline nose sprays frequently 4. Saline irrigations of the nose can be very helpful if done frequently.  * 4X daily for 1 week*  * Use of a nettie pot can be helpful with this. Follow directions with this* 5. Drink plenty of fluids 6. Keep thermostat turn down low 7.For any cough or congestion-mucinex  d as needed 8. For fever or aces or pains- take tylenol  or ibuprofen  appropriate for age and weight.  * for fevers greater than 101 orally you may alternate ibuprofen  and tylenol  every  3 hours.    - amoxicillin -clavulanate (AUGMENTIN )  875-125 MG tablet; Take 1 tablet by mouth 2 (two) times daily.  Dispense: 14 tablet; Refill: 0    The above assessment and management plan was discussed with the patient. The patient verbalized understanding of and has agreed to the management plan. Patient is aware to call the clinic if symptoms persist or worsen. Patient is aware when to return to the clinic for a follow-up visit. Patient educated on when it is appropriate to go to the emergency department.   Mary-Margaret Gladis, FNP

## 2023-10-14 ENCOUNTER — Ambulatory Visit: Payer: Self-pay | Admitting: Surgical

## 2023-10-14 ENCOUNTER — Encounter: Payer: Self-pay | Admitting: Surgical

## 2023-10-14 VITALS — Ht 66.0 in | Wt 275.0 lb

## 2023-10-14 DIAGNOSIS — M1711 Unilateral primary osteoarthritis, right knee: Secondary | ICD-10-CM

## 2023-10-20 ENCOUNTER — Encounter: Payer: Self-pay | Admitting: Surgical

## 2023-10-20 NOTE — Progress Notes (Signed)
 Office Visit Note   Patient: [name] [name]           Date of Birth: [birth year]           MRN: [id number] Visit Date: 10/14/2023 Requested by: Gladis Mustard, FNP [address] [place],  [place] [address] PCP: Gladis Mustard, FNP  Subjective: Chief Complaint  Patient presents with   Right Knee - Pain    HPI: [name] [name] is a 53 y.o. female who presents to the office reporting continued right knee pain.  Patient is here to review MRI scan of the right knee that demonstrates moderate arthritis of the medial compartment and patellofemoral compartment with some mild arthritis of the lateral compartment as well as radial tear near the root of the posterior lateral meniscus.  She also has degeneration of the medial meniscus.  She states that she has continued severe knee pain that did not respond to prior injection for very long.  She did have a recent job at [employer] but she is not working currently due to difficulty weightbearing.  She can even make it through the grocery store without having to rest due to pain.  She cannot exercise.  Describes primarily medial pain.  No history of prior surgery to the right knee.  Only medical history significant for OSA and hypothyroidism.  She uses CPAP for her sleep apnea.  No blood thinner use.  No cardiac history.  No diabetes or smoking history..                ROS: All systems reviewed are negative as they relate to the chief complaint within the history of present illness.  Patient denies fevers or chills.  Assessment & Plan: Visit Diagnoses:  1. Arthritis of right knee     Plan: Impression is 53 year old female with right knee MRI demonstrating moderate arthritis of the medial and patellofemoral compartments primarily with radial tear near the posterior horn of the lateral meniscal root.  With lack of response to cortisone injection and her physical job requirements, she would like to proceed with surgical intervention.   Do not think any arthroscopic intervention will be predictably helpful for her.  Best option would be right total knee arthroplasty.    We discussed all options available to patient including surgical and nonsurgical options.  Patient would like to proceed with knee replacement.  Discussed risks and benefits of knee replacement including but not limited to the risk of nerve/blood vessel damage, knee stiffness, infection, need for revision surgery, loosening, continued pain/discomfort, medical complication from surgery such as DVT/PE/heart attack/stroke/death.  We discussed the recovery timeframe and the extensive rehab that is required after knee replacement surgery.  They understand that this is a very painful procedure and will require significant dedication in order to achieve optimal knee function after surgery.  Patient agreed with plan.  Follow-up after procedure.  Patient will need to be posted for at least 3 months out from her prior knee injection on 08/28/2023.  Soonest surgical date would be around 9/18.   Follow-Up Instructions: No follow-ups on file.   Orders:  No orders of the defined types were placed in this encounter.  No orders of the defined types were placed in this encounter.     Procedures: No procedures performed   Clinical Data: No additional findings.  Objective: Vital Signs: Ht 5' 6 (1.676 m)   Wt 275 lb (124.7 kg)   LMP 01/28/2011   BMI 44.39 kg/m  Physical Exam:  Constitutional: Patient appears well-developed HEENT:  Head: Normocephalic Eyes:EOM are normal Neck: Normal range of motion Cardiovascular: Normal rate Pulmonary/chest: Effort normal Neurologic: Patient is alert Skin: Skin is warm Psychiatric: Patient has normal mood and affect  Ortho Exam: Ortho exam demonstrates right knee with 0 degrees extension and 120 degrees of knee flexion.  Positive effusion.  2+ DP pulse of the right lower extremity.  No calf tenderness.  Negative Homans' sign.   No cellulitis or skin changes noted around the right knee region.  No pain with hip range of motion.  Able to perform straight leg raise without extensor lag.  Stable to anterior and posterior drawer sign.  Stable to varus and valgus stress at 0 and 30 degrees.  Specialty Comments:  No specialty comments available.  Imaging: No results found.   PMFS History: Patient Active Problem List   Diagnosis Date Noted   Chronic rhinitis 02/01/2020   S/P right rotator cuff repair 04/16/2019   Gastroesophageal reflux disease without esophagitis 07/21/2018   Hidradenitis suppurativa 10/01/2016   OSA (obstructive sleep apnea) 05/13/2015   Morbid obesity (HCC) 05/13/2015   Hypothyroidism 03/27/2014   Panic attacks 03/27/2014   SVT (supraventricular tachycardia) (HCC) 06/26/2012   Migraines 06/26/2012   Past Medical History:  Diagnosis Date   Allergy    SEASONAL   Anxiety    Chronic headaches    Colon polyps    GERD (gastroesophageal reflux disease)    History of kidney stones    Hyperlipidemia    Hypothyroidism    Palpitations    Sleep apnea     Family History  Problem Relation Age of Onset   Hypertension Mother    Lung cancer Father        died from   Heart disease Maternal Grandmother    Heart disease Maternal Grandfather    Breast cancer Paternal Grandmother        paternal aunts x2   Colon cancer Neg Hx    Esophageal cancer Neg Hx    Rectal cancer Neg Hx    Stomach cancer Neg Hx    Colon polyps Neg Hx     Past Surgical History:  Procedure Laterality Date   CESAREAN SECTION     1194   COLONOSCOPY     KNEE ARTHROSCOPY     LITHOTRIPSY  12/2014   ROTATOR CUFF REPAIR Right 2021   Social History   Occupational History    Employer: [employer]  Tobacco Use   Smoking status: Former    Current packs/day: 0.00    Average packs/day: 0.5 packs/day for 10.1 years (5.0 ttl pk-yrs)    Types: Cigarettes    Start date: 03/12/2008    Quit date: 04/05/2018    Years since  quitting: 5.5   Smokeless tobacco: Never  Vaping Use   Vaping status: Never Used  Substance and Sexual Activity   Alcohol use: Yes    Alcohol/week: 0.0 standard drinks of alcohol    Comment: rare   Drug use: No   Sexual activity: Not on file

## 2023-10-22 ENCOUNTER — Telehealth: Payer: Self-pay

## 2023-10-22 DIAGNOSIS — H5213 Myopia, bilateral: Secondary | ICD-10-CM | POA: Diagnosis not present

## 2023-10-22 DIAGNOSIS — G4733 Obstructive sleep apnea (adult) (pediatric): Secondary | ICD-10-CM | POA: Diagnosis not present

## 2023-10-22 NOTE — Telephone Encounter (Signed)
 Copied from CRM [id number]. Topic: Clinical - Medical Advice >> Oct 22, 2023  3:57 PM Montie POUR wrote: Reason for CRM:  Mane needs to have surgery clearance for her knee surgery on September 23. Ahnesty just saw  FNP Gladis on 10/10/23 and she wants to know if she can complete the form without an appointment. Please call Niylah at [phone]. Thanks

## 2023-10-22 NOTE — Telephone Encounter (Signed)
 Called and scheduled appt for surgical clearance

## 2023-10-23 ENCOUNTER — Telehealth: Payer: Self-pay

## 2023-10-23 NOTE — Telephone Encounter (Signed)
 Copied from CRM #[id number]. Topic: Clinical - Prescription Issue >> Oct 23, 2023  3:34 PM Sasha H wrote: Reason for CRM: [employer] called to inform the office that the patient will need a PA for mometasone  (NASONEX ) 50 MCG/ACT nasal spray, stating that she needs this

## 2023-10-24 ENCOUNTER — Other Ambulatory Visit (HOSPITAL_COMMUNITY): Payer: Self-pay

## 2023-10-24 ENCOUNTER — Telehealth: Payer: Self-pay

## 2023-10-24 NOTE — Telephone Encounter (Signed)
 PA request has been Started. New Encounter has been or will be created for follow up. For additional info see Pharmacy Prior Auth telephone encounter from 10/24/23.

## 2023-10-24 NOTE — Telephone Encounter (Signed)
 Pharmacy Patient Advocate Encounter   Received notification from Pt Calls Messages that prior authorization for Mometasone  Furoate 50MCG/ACT suspension is required/requested.   Insurance verification completed.   The patient is insured through [facility] .   Per test claim: PA required; PA started via CoverMyMeds. KEY [id number] . Waiting for clinical questions to populate.

## 2023-10-24 NOTE — Telephone Encounter (Signed)
 Pharmacy Patient Advocate Encounter  Received notification from [facility] that Prior Authorization for  Mometasone  Furoate 50MCG/ACT suspension  has been APPROVED from 10/24/23 to 10/23/24. Ran test claim, Copay is $4. This test claim was processed through [facility]- copay amounts may vary at other pharmacies due to pharmacy/plan contracts, or as the patient moves through the different stages of their insurance plan.   PA #/Case ID/Reference #: [id number]

## 2023-10-24 NOTE — Telephone Encounter (Signed)
 Clinical questions have been answered and PA submitted. PA currently Pending.

## 2023-11-01 ENCOUNTER — Encounter: Payer: Self-pay | Admitting: Radiology

## 2023-11-12 ENCOUNTER — Ambulatory Visit: Admitting: Nurse Practitioner

## 2023-11-12 ENCOUNTER — Ambulatory Visit (INDEPENDENT_AMBULATORY_CARE_PROVIDER_SITE_OTHER)

## 2023-11-12 ENCOUNTER — Ambulatory Visit: Payer: Self-pay | Admitting: Nurse Practitioner

## 2023-11-12 ENCOUNTER — Encounter: Payer: Self-pay | Admitting: Nurse Practitioner

## 2023-11-12 VITALS — BP 127/78 | HR 72 | Temp 97.2°F | Resp 18 | Ht 66.0 in | Wt 275.0 lb

## 2023-11-12 DIAGNOSIS — Z01818 Encounter for other preprocedural examination: Secondary | ICD-10-CM

## 2023-11-12 DIAGNOSIS — G43901 Migraine, unspecified, not intractable, with status migrainosus: Secondary | ICD-10-CM | POA: Diagnosis not present

## 2023-11-12 DIAGNOSIS — R3915 Urgency of urination: Secondary | ICD-10-CM

## 2023-11-12 DIAGNOSIS — F410 Panic disorder [episodic paroxysmal anxiety] without agoraphobia: Secondary | ICD-10-CM | POA: Diagnosis not present

## 2023-11-12 DIAGNOSIS — Z23 Encounter for immunization: Secondary | ICD-10-CM | POA: Diagnosis not present

## 2023-11-12 DIAGNOSIS — K219 Gastro-esophageal reflux disease without esophagitis: Secondary | ICD-10-CM

## 2023-11-12 DIAGNOSIS — R918 Other nonspecific abnormal finding of lung field: Secondary | ICD-10-CM | POA: Diagnosis not present

## 2023-11-12 DIAGNOSIS — Z87891 Personal history of nicotine dependence: Secondary | ICD-10-CM | POA: Diagnosis not present

## 2023-11-12 MED ORDER — LEVOTHYROXINE SODIUM 75 MCG PO TABS: 75.0000 ug | ORAL_TABLET | Freq: Every day | ORAL | 1 refills | Status: AC

## 2023-11-12 MED ORDER — OXYBUTYNIN CHLORIDE ER 5 MG PO TB24: 5.0000 mg | ORAL_TABLET | Freq: Every day | ORAL | 1 refills | Status: DC

## 2023-11-12 MED ORDER — CLONAZEPAM 0.5 MG PO TABS: 0.5000 mg | ORAL_TABLET | Freq: Two times a day (BID) | ORAL | 5 refills | Status: DC | PRN

## 2023-11-12 MED ORDER — PROPRANOLOL HCL 80 MG PO TABS
80.0000 mg | ORAL_TABLET | Freq: Every day | ORAL | 1 refills | Status: DC
Start: 2023-11-12 — End: 2024-01-23

## 2023-11-12 MED ORDER — METOCLOPRAMIDE HCL 10 MG PO TABS: 10.0000 mg | ORAL_TABLET | Freq: Every day | ORAL | 1 refills | Status: DC

## 2023-11-12 MED ORDER — PANTOPRAZOLE SODIUM 40 MG PO TBEC: 40.0000 mg | DELAYED_RELEASE_TABLET | Freq: Two times a day (BID) | ORAL | 1 refills | Status: DC

## 2023-11-12 NOTE — Addendum Note (Signed)
 Addended by: Brace Welte, MARY-MARGARET on: 11/12/2023 12:31 PM   Modules accepted: Orders

## 2023-11-12 NOTE — Progress Notes (Signed)
 Subjective:    Patient ID: [name] [name], female    DOB: 02-Dec-1970, 53 y.o.   MRN: [id number]   Chief Complaint: Pre-op Exam   HPI  Patient is having a knee replacement on December 03, 2023. [name] here for surgical clearance. [name] doing well today with no complaints. Patient Active Problem List   Diagnosis Date Noted   Chronic rhinitis 02/01/2020   S/P right rotator cuff repair 04/16/2019   Gastroesophageal reflux disease without esophagitis 07/21/2018   Hidradenitis suppurativa 10/01/2016   OSA (obstructive sleep apnea) 05/13/2015   Morbid obesity (HCC) 05/13/2015   Hypothyroidism 03/27/2014   Panic attacks 03/27/2014   SVT (supraventricular tachycardia) (HCC) 06/26/2012   Migraines 06/26/2012       Review of Systems  Constitutional:  Negative for diaphoresis.  Eyes:  Negative for pain.  Respiratory:  Negative for shortness of breath.   Cardiovascular:  Negative for chest pain, palpitations and leg swelling.  Gastrointestinal:  Negative for abdominal pain.  Endocrine: Negative for polydipsia.  Skin:  Negative for rash.  Neurological:  Negative for dizziness, weakness and headaches.  Hematological:  Does not bruise/bleed easily.  All other systems reviewed and are negative.      Objective:   Physical Exam Vitals and nursing note reviewed.  Constitutional:      General: [name] not in acute distress.    Appearance: Normal appearance. [name] well-developed.  HENT:     Head: Normocephalic.     Right Ear: Tympanic membrane normal.     Left Ear: Tympanic membrane normal.     Nose: Nose normal.     Mouth/Throat:     Mouth: Mucous membranes are moist.  Eyes:     Pupils: Pupils are equal, round, and reactive to light.  Neck:     Vascular: No carotid bruit or JVD.  Cardiovascular:     Rate and Rhythm: Normal rate and regular rhythm.     Heart sounds: Normal heart sounds.  Pulmonary:     Effort: Pulmonary effort is normal. No respiratory distress.     Breath  sounds: Normal breath sounds. No wheezing or rales.  Chest:     Chest wall: No tenderness.  Abdominal:     General: Bowel sounds are normal. There is no distension or abdominal bruit.     Palpations: Abdomen is soft. There is no hepatomegaly, splenomegaly, mass or pulsatile mass.     Tenderness: There is no abdominal tenderness.  Musculoskeletal:        General: Normal range of motion.     Cervical back: Normal range of motion and neck supple.  Lymphadenopathy:     Cervical: No cervical adenopathy.  Skin:    General: Skin is warm and dry.  Neurological:     Mental Status: [name] alert and oriented to person, place, and time.     Deep Tendon Reflexes: Reflexes are normal and symmetric.  Psychiatric:        Behavior: Behavior normal.        Thought Content: Thought content normal.        Judgment: Judgment normal.     BP 127/78   Pulse 72   Temp (!) 97.2 F (36.2 C) (Temporal)   Ht 5' 6 (1.676 m)   Wt 275 lb (124.7 kg)   LMP 01/28/2011   SpO2 95%   BMI 44.39 kg/m   Chest xray- normal- no abnormal findings- Preliminary reading by Ronal Lunger, FNP  [facility]  EKG-  Nsr-Mary-Margaret Gladis, FNP      Assessment & Plan:  [name] [name] in today with chief complaint of Pre-op Exam   1. Pre-op exam (Primary) Cleared for surgery - DG Chest 2 View - EKG 12-Lead    The above assessment and management plan was discussed with the patient. The patient verbalized understanding of and has agreed to the management plan. Patient is aware to call the clinic if symptoms persist or worsen. Patient is aware when to return to the clinic for a follow-up visit. Patient educated on when it is appropriate to go to the emergency department.   Mary-Margaret Gladis, FNP

## 2023-11-21 ENCOUNTER — Other Ambulatory Visit: Payer: Self-pay | Admitting: Nurse Practitioner

## 2023-11-27 NOTE — Progress Notes (Signed)
 Surgical Instructions   Your procedure is scheduled on Tuesday December 03, 2022. Report to [facility] Main Entrance A at 9:00 A.M., then check in with the Admitting office. Any questions or running late day of surgery: call [phone]  Questions prior to your surgery date: call [phone], Monday-Friday, 8am-4pm. If you experience any cold or flu symptoms such as cough, fever, chills, shortness of breath, etc. between now and your scheduled surgery, please notify us  at the above number.     Remember:  Do not eat after midnight the night before your surgery  You may drink clear liquids until 8:00 the morning of your surgery.   Clear liquids allowed are: Water, Non-Citrus Juices (without pulp), Carbonated Beverages, Clear Tea (no milk, honey, etc.), Black Coffee Only (NO MILK, CREAM OR POWDERED CREAMER of any kind), and Gatorade.  Patient Instructions  The night before surgery:  No food after midnight. ONLY clear liquids after midnight  The day of surgery (if you do NOT have diabetes):  Drink ONE (1) Pre-Surgery Clear Ensure by 8:00 the morning of surgery. Drink in one sitting. Do not sip.  This drink was given to you during your hospital  pre-op appointment visit.  Nothing else to drink after completing the  Pre-Surgery Clear Ensure.         If you have questions, please contact your surgeon's office.    Take these medicines the morning of surgery with A SIP OF WATER  levothyroxine  (SYNTHROID )  liothyronine  (CYTOMEL )  oxybutynin  (DITROPAN -XL)  pantoprazole  (PROTONIX )  propranolol  (INDERAL )   May take these medicines IF NEEDED: azelastine  (ASTELIN ) 0.1 % nasal spray  clonazePAM  (KLONOPIN )    One week prior to surgery, STOP taking any Aspirin (unless otherwise instructed by your surgeon) Aleve , Naproxen , Ibuprofen , Motrin , Advil , Goody's, BC's, all herbal medications, fish oil, and non-prescription vitamins.                     Do NOT Smoke (Tobacco/Vaping) for 24  hours prior to your procedure.  If you use a CPAP at night, you may bring your mask/headgear for your overnight stay.   You will be asked to remove any contacts, glasses, piercing's, hearing aid's, dentures/partials prior to surgery. Please bring cases for these items if needed.    Patients discharged the day of surgery will not be allowed to drive home, and someone needs to stay with them for 24 hours.  SURGICAL WAITING ROOM VISITATION Patients may have no more than 2 support people in the waiting area - these visitors may rotate.   Pre-op nurse will coordinate an appropriate time for 1 ADULT support person, who may not rotate, to accompany patient in pre-op.  Children under the age of 83 must have an adult with them who is not the patient and must remain in the main waiting area with an adult.  If the patient needs to stay at the hospital during part of their recovery, the visitor guidelines for inpatient rooms apply.  Please refer to the [facility] website for the visitor guidelines for any additional information.   If you received a COVID test during your pre-op visit  it is requested that you wear a mask when out in public, stay away from anyone that may not be feeling well and notify your surgeon if you develop symptoms. If you have been in contact with anyone that has tested positive in the last 10 days please notify you surgeon.      Pre-operative 5  CHG Bathing Instructions   You can play a key role in reducing the risk of infection after surgery. Your skin needs to be as free of germs as possible. You can reduce the number of germs on your skin by washing with CHG (chlorhexidine gluconate) soap before surgery. CHG is an antiseptic soap that kills germs and continues to kill germs even after washing.   DO NOT use if you have an allergy to chlorhexidine/CHG or antibacterial soaps. If your skin becomes reddened or irritated, stop using the CHG and notify one of our RNs at  [phone].   Please shower with the CHG soap starting 4 days before surgery using the following schedule:     Please keep in mind the following:  DO NOT shave, including legs and underarms, starting the day of your first shower.   Place clean sheets on your bed the day you start using CHG soap. Use a clean washcloth (not used since being washed) for each shower. DO NOT sleep with pets once you start using the CHG.   CHG Shower Instructions:  Wash your face and private area with normal soap. If you choose to wash your hair, wash first with your normal shampoo.  After you use shampoo/soap, rinse your hair and body thoroughly to remove shampoo/soap residue.  Turn the water OFF and apply about 3 tablespoons (45 ml) of CHG soap to a CLEAN washcloth.  Apply CHG soap ONLY FROM YOUR NECK DOWN TO YOUR TOES (washing for 3-5 minutes)  DO NOT use CHG soap on face, private areas, open wounds, or sores.  Pay special attention to the area where your surgery is being performed.  If you are having back surgery, having someone wash your back for you may be helpful. Wait 2 minutes after CHG soap is applied, then you may rinse off the CHG soap.  Pat dry with a clean towel  Put on clean clothes/pajamas   If you choose to wear lotion, please use ONLY the CHG-compatible lotions that are listed below.  Additional instructions for the day of surgery: DO NOT APPLY any lotions, deodorants or perfumes.   Do not bring valuables to the hospital. [facility] is not responsible for any belongings/valuables. Do not wear nail polish, gel polish, artificial nails, or any other type of covering on natural nails (fingers and toes) Do not wear jewelry or makeup Put on clean/comfortable clothes.  Please brush your teeth.  Ask your nurse before applying any prescription medications to the skin.     CHG Compatible Lotions   Aveeno Moisturizing lotion  Cetaphil Moisturizing Cream  Cetaphil Moisturizing Lotion   Clairol Herbal Essence Moisturizing Lotion, Dry Skin  Clairol Herbal Essence Moisturizing Lotion, Extra Dry Skin  Clairol Herbal Essence Moisturizing Lotion, Normal Skin  Curel Age Defying Therapeutic Moisturizing Lotion with Alpha Hydroxy  Curel Extreme Care Body Lotion  Curel Soothing Hands Moisturizing Hand Lotion  Curel Therapeutic Moisturizing Cream, Fragrance-Free  Curel Therapeutic Moisturizing Lotion, Fragrance-Free  Curel Therapeutic Moisturizing Lotion, Original Formula  Eucerin Daily Replenishing Lotion  Eucerin Dry Skin Therapy Plus Alpha Hydroxy Crme  Eucerin Dry Skin Therapy Plus Alpha Hydroxy Lotion  Eucerin Original Crme  Eucerin Original Lotion  Eucerin Plus Crme Eucerin Plus Lotion  Eucerin TriLipid Replenishing Lotion  Keri Anti-Bacterial Hand Lotion  Keri Deep Conditioning Original Lotion Dry Skin Formula Softly Scented  Keri Deep Conditioning Original Lotion, Fragrance Free Sensitive Skin Formula  Keri Lotion Fast Absorbing Fragrance Free Sensitive Skin Formula  Keri Lotion  Fast Absorbing Softly Scented Dry Skin Formula  Keri Original Lotion  Keri Skin Renewal Lotion Keri Silky Smooth Lotion  Keri Silky Smooth Sensitive Skin Lotion  Nivea Body Creamy Conditioning Oil  Nivea Body Extra Enriched Lotion  Nivea Body Original Lotion  Nivea Body Sheer Moisturizing Lotion Nivea Crme  Nivea Skin Firming Lotion  NutraDerm 30 Skin Lotion  NutraDerm Skin Lotion  NutraDerm Therapeutic Skin Cream  NutraDerm Therapeutic Skin Lotion  ProShield Protective Hand Cream  Provon moisturizing lotion  Please read over the following fact sheets that you were given.

## 2023-11-28 ENCOUNTER — Other Ambulatory Visit: Payer: Self-pay

## 2023-11-28 ENCOUNTER — Encounter (HOSPITAL_COMMUNITY): Payer: Self-pay

## 2023-11-28 ENCOUNTER — Encounter (HOSPITAL_COMMUNITY)
Admission: RE | Admit: 2023-11-28 | Discharge: 2023-11-28 | Disposition: A | Discharge status: Home / Self Care | Source: Ambulatory Visit | Attending: Orthopedic Surgery | Admitting: Orthopedic Surgery

## 2023-11-28 VITALS — BP 156/93 | HR 70 | Temp 97.9°F | Resp 17 | Ht 66.0 in | Wt 279.0 lb

## 2023-11-28 DIAGNOSIS — Z87442 Personal history of urinary calculi: Secondary | ICD-10-CM | POA: Diagnosis not present

## 2023-11-28 DIAGNOSIS — E785 Hyperlipidemia, unspecified: Secondary | ICD-10-CM | POA: Diagnosis not present

## 2023-11-28 DIAGNOSIS — E039 Hypothyroidism, unspecified: Secondary | ICD-10-CM | POA: Diagnosis not present

## 2023-11-28 DIAGNOSIS — K219 Gastro-esophageal reflux disease without esophagitis: Secondary | ICD-10-CM | POA: Insufficient documentation

## 2023-11-28 DIAGNOSIS — G4733 Obstructive sleep apnea (adult) (pediatric): Secondary | ICD-10-CM | POA: Diagnosis not present

## 2023-11-28 DIAGNOSIS — Z6841 Body Mass Index (BMI) 40.0 and over, adult: Secondary | ICD-10-CM | POA: Insufficient documentation

## 2023-11-28 DIAGNOSIS — M1711 Unilateral primary osteoarthritis, right knee: Secondary | ICD-10-CM | POA: Insufficient documentation

## 2023-11-28 DIAGNOSIS — I10 Essential (primary) hypertension: Secondary | ICD-10-CM | POA: Diagnosis not present

## 2023-11-28 DIAGNOSIS — Z01812 Encounter for preprocedural laboratory examination: Secondary | ICD-10-CM | POA: Diagnosis not present

## 2023-11-28 DIAGNOSIS — Z01818 Encounter for other preprocedural examination: Secondary | ICD-10-CM | POA: Diagnosis present

## 2023-11-28 HISTORY — DX: Essential (primary) hypertension: I10

## 2023-11-28 LAB — CBC
HCT: 45.9 % (ref 36.0–46.0)
Hemoglobin: 15.2 g/dL — ABNORMAL HIGH (ref 12.0–15.0)
MCH: 29.5 pg (ref 26.0–34.0)
MCHC: 33.1 g/dL (ref 30.0–36.0)
MCV: 89.1 fL (ref 80.0–100.0)
Platelets: 281 K/uL (ref 150–400)
RBC: 5.15 MIL/uL — ABNORMAL HIGH (ref 3.87–5.11)
RDW: 13.7 % (ref 11.5–15.5)
WBC: 10.9 K/uL — ABNORMAL HIGH (ref 4.0–10.5)
nRBC: 0 % (ref 0.0–0.2)

## 2023-11-28 LAB — URINALYSIS, W/ REFLEX TO CULTURE (INFECTION SUSPECTED)
Bilirubin Urine: NEGATIVE
Glucose, UA: NEGATIVE mg/dL
Hgb urine dipstick: NEGATIVE
Ketones, ur: NEGATIVE mg/dL
Leukocytes,Ua: NEGATIVE
Nitrite: NEGATIVE
Protein, ur: NEGATIVE mg/dL
Specific Gravity, Urine: 1.011 (ref 1.005–1.030)
pH: 6 (ref 5.0–8.0)

## 2023-11-28 LAB — BASIC METABOLIC PANEL WITH GFR
Anion gap: 12 (ref 5–15)
BUN: 11 mg/dL (ref 6–20)
CO2: 23 mmol/L (ref 22–32)
Calcium: 9 mg/dL (ref 8.9–10.3)
Chloride: 107 mmol/L (ref 98–111)
Creatinine, Ser: 0.91 mg/dL (ref 0.44–1.00)
GFR, Estimated: >60 mL/min (ref >60)
Glucose, Bld: 106 mg/dL — ABNORMAL HIGH (ref 70–99)
Potassium: 3.8 mmol/L (ref 3.5–5.1)
Sodium: 142 mmol/L (ref 135–145)

## 2023-11-28 LAB — SURGICAL PCR SCREEN
MRSA, PCR: NEGATIVE
Staphylococcus aureus: NEGATIVE

## 2023-11-28 NOTE — Progress Notes (Signed)
 PCP - Dr. Ronal Rollene Lunger Cardiologist - Dr. Maude Emmer  PPM/ICD - Denies Device Orders - n/a Rep Notified - n/a  Chest x-ray - 11-12-23 EKG - 11-12-23 Stress Test - 03-30-15 ECHO - Denies Cardiac Cath - Denies  Sleep Study - Yes CPAP - wears nightly and she knows her settings  NON-diabetic  Last dose of GLP1 agonist-  Denies GLP1 instructions: n/a  Blood Thinner Instructions: Denies Aspirin Instructions: Denies  ERAS Protcol -Clears until 800 PRE-SURGERY Ensure or G2- Ensure  COVID TEST- n/a   Anesthesia review: Yes, sleep apnea, SVT, OSA w/cpap  Patient denies shortness of breath, fever, cough and chest pain at PAT appointment. Patient  any respiratory issues at this time.    All instructions explained to the patient, with a verbal understanding of the material. Patient agrees to go over the instructions while at home for a better understanding. Patient also instructed to self quarantine after being tested for COVID-19. The opportunity to ask questions was provided.

## 2023-11-29 NOTE — Anesthesia Preprocedure Evaluation (Addendum)
 Anesthesia Evaluation  Patient identified by MRN, date of birth, ID band Patient awake    Reviewed: Allergy & Precautions, NPO status , Patient's Chart, lab work & pertinent test results  Airway Mallampati: II  TM Distance: >3 FB Neck ROM: Full    Dental  (+) Dental Advisory Given   Pulmonary sleep apnea , former smoker   breath sounds clear to auscultation       Cardiovascular hypertension, Pt. on medications and Pt. on home beta blockers  Rhythm:Regular Rate:Normal     Neuro/Psych  Headaches    GI/Hepatic Neg liver ROS,GERD  ,,  Endo/Other  Hypothyroidism    Renal/GU negative Renal ROS     Musculoskeletal   Abdominal   Peds  Hematology negative hematology ROS (+)   Anesthesia Other Findings   Reproductive/Obstetrics                              Anesthesia Physical Anesthesia Plan  ASA: 3  Anesthesia Plan: Spinal   Post-op Pain Management: Regional block*, Tylenol  PO (pre-op)* and Toradol  IV (intra-op)*   Induction: Intravenous  PONV Risk Score and Plan: 2 and Dexamethasone , Ondansetron , Propofol  infusion and Treatment may vary due to age or medical condition  Airway Management Planned: Natural Airway and Simple Face Mask  Additional Equipment:   Intra-op Plan:   Post-operative Plan:   Informed Consent: I have reviewed the patients History and Physical, chart, labs and discussed the procedure including the risks, benefits and alternatives for the proposed anesthesia with the patient or authorized representative who has indicated his/her understanding and acceptance.     Dental advisory given  Plan Discussed with: CRNA  Anesthesia Plan Comments: (  )         Anesthesia Quick Evaluation

## 2023-11-29 NOTE — Progress Notes (Signed)
 Anesthesia Chart Review:  Case: [id number] Date/Time: 12/03/23 1042   Procedure: ARTHROPLASTY, KNEE, TOTAL (Right: Knee)   Anesthesia type: Spinal   Diagnosis: Primary osteoarthritis of right knee [M17.11]   Pre-op diagnosis: right knee osteoarthritis   Location: [facility] ROOM 04 / [facility]   Surgeons: Addie Cordella Hamilton, MD       DISCUSSION: Patient is a 53 year old female scheduled for the above procedure.  History includes normal smoker, HTN, HLD, OSA (uses CPAP), GERD, palpitations, hypothyroidism, headaches, nephrolithiasis, osteoarthritis.  BMI is consistent with morbid obesity.  She had a preoperative evaluation by PCP Gladis Mustard, FNP on 11/12/2023 with EKG and chest x-ray She wrote Cleared for surgery.  Anesthesia team to evaluate on the day of surgery.   VS: BP (!) 156/93   Pulse 70   Temp 36.6 C   Resp 17   Ht 5' 6 (1.676 m)   Wt 126.6 kg   LMP 01/28/2011   SpO2 95%   BMI 45.03 kg/m   PROVIDERS: Gladis Mustard, FNP is PCP [name] not followed by cardiology routinely. Evaluations with Rolan Barrack, MD in 2010 and with Delford Coy, MD in 2020.  Remote history of SVT.  No arrhythmia seen on 2010 4 week monitor. Negative dobutamine echo in 2017.   LABS: Labs reviewed: Acceptable for surgery. (all labs ordered are listed, but only abnormal results are displayed)  Labs Reviewed  CBC - Abnormal; Notable for the following components:      Result Value   WBC 10.9 (*)    RBC 5.15 (*)    Hemoglobin 15.2 (*)    All other components within normal limits  BASIC METABOLIC PANEL WITH GFR - Abnormal; Notable for the following components:   Glucose, Bld 106 (*)    All other components within normal limits  URINALYSIS, W/ REFLEX TO CULTURE (INFECTION SUSPECTED) - Abnormal; Notable for the following components:   APPearance HAZY (*)    Bacteria, UA RARE (*)    All other components within normal limits  SURGICAL PCR SCREEN   Home Sleep Study  03/02/2015: Impressions: This study shows severe sleep apnea with an AHI of 92.2 and SaO2 low of 47%.  In addition to weight loss, given the severity of her sleep apnea, she should undergo CPAP therapy.   IMAGES: CXR 11/12/2023: FINDINGS: The cardiomediastinal contours are normal. Mild chronic bronchial thickening. Pulmonary vasculature is normal. No consolidation, pleural effusion, or pneumothorax. No acute osseous abnormalities are seen. Chronic mild compression deformity at the thoracolumbar junction. IMPRESSION: Mild chronic bronchial thickening. No acute findings.   MRI right knee 09/16/2023: IMPRESSION: 1. Maceration of the body of the medial meniscus. 2. Degeneration of the posterior horn lateral meniscus with a partial radial tear towards the root. 3. Tricompartmental cartilage abnormalities as described above.    EKG:  EKG 11/12/2023: Sinus Rhythm  -RSR(V1) -nondiagnostic. -T-abnormality -Anterior ischemia. ABNORMAL - Overall tracing appears similar to 01/07/19 EKG.  CV: Dobutamine stress echo 03/30/2015: Conclusions: 1.  Negative dobutamine stress echo. 2.  Normal rest and stress wall motion. 3.  Structurally normal heart save for mild RV dilation. 4.  Increased ejection fraction with stress. 5.  Negative by EKG criteria.  Past Medical History:  Diagnosis Date   Allergy    SEASONAL   Anxiety    Chronic headaches    Colon polyps    GERD (gastroesophageal reflux disease)    History of kidney stones    Hyperlipidemia    Hypertension  Hypothyroidism    Palpitations    Sleep apnea     Past Surgical History:  Procedure Laterality Date   CESAREAN SECTION     1194   COLONOSCOPY     KNEE ARTHROSCOPY     LITHOTRIPSY  12/2014   ROTATOR CUFF REPAIR Right 2021    MEDICATIONS:  azelastine  (ASTELIN ) 0.1 % nasal spray   clonazePAM  (KLONOPIN ) 0.5 MG tablet   cyclobenzaprine  (FLEXERIL ) 10 MG tablet   fexofenadine (ALLEGRA) 180 MG tablet   levothyroxine   (SYNTHROID ) 75 MCG tablet   liothyronine  (CYTOMEL ) 5 MCG tablet   Magnesium  Oxide 420 MG TABS   metoCLOPramide  (REGLAN ) 10 MG tablet   mometasone  (NASONEX ) 50 MCG/ACT nasal spray   naproxen  (NAPROSYN ) 500 MG tablet   Omega-3 Fatty Acids (FISH OIL PO)   oxybutynin  (DITROPAN -XL) 5 MG 24 hr tablet   pantoprazole  (PROTONIX ) 40 MG tablet   progesterone (PROMETRIUM) 100 MG capsule   propranolol  (INDERAL ) 80 MG tablet   No current facility-administered medications for this encounter.    Isaiah Ruder, PA-C Surgical Short Stay/Anesthesiology [facility] Phone [phone] [facility] Phone [phone] 11/29/2023 7:01 PM

## 2023-12-02 MED ORDER — TRANEXAMIC ACID 1000 MG/10ML IV SOLN
2000.0000 mg | INTRAVENOUS | Status: DC
  Filled 2023-12-02: qty 20

## 2023-12-03 ENCOUNTER — Other Ambulatory Visit: Payer: Self-pay

## 2023-12-03 ENCOUNTER — Encounter (HOSPITAL_COMMUNITY)
Admission: RE | Disposition: A | Discharge status: Home / Self Care | Payer: Self-pay | Source: Home / Self Care | Attending: Orthopedic Surgery

## 2023-12-03 ENCOUNTER — Observation Stay (HOSPITAL_COMMUNITY)
Admission: RE | Admit: 2023-12-03 | Discharge: 2023-12-04 | Disposition: A | Discharge status: Home / Self Care | Attending: Orthopedic Surgery | Admitting: Orthopedic Surgery

## 2023-12-03 ENCOUNTER — Encounter (HOSPITAL_COMMUNITY): Payer: Self-pay | Admitting: Orthopedic Surgery

## 2023-12-03 ENCOUNTER — Ambulatory Visit (HOSPITAL_BASED_OUTPATIENT_CLINIC_OR_DEPARTMENT_OTHER): Admitting: Anesthesiology

## 2023-12-03 ENCOUNTER — Ambulatory Visit (HOSPITAL_COMMUNITY): Payer: Self-pay | Admitting: Vascular Surgery

## 2023-12-03 DIAGNOSIS — M1711 Unilateral primary osteoarthritis, right knee: Secondary | ICD-10-CM | POA: Diagnosis not present

## 2023-12-03 DIAGNOSIS — G8918 Other acute postprocedural pain: Secondary | ICD-10-CM | POA: Diagnosis not present

## 2023-12-03 DIAGNOSIS — I10 Essential (primary) hypertension: Secondary | ICD-10-CM | POA: Insufficient documentation

## 2023-12-03 DIAGNOSIS — Z87891 Personal history of nicotine dependence: Secondary | ICD-10-CM | POA: Insufficient documentation

## 2023-12-03 DIAGNOSIS — Z01818 Encounter for other preprocedural examination: Principal | ICD-10-CM

## 2023-12-03 DIAGNOSIS — Z96651 Presence of right artificial knee joint: Secondary | ICD-10-CM

## 2023-12-03 DIAGNOSIS — G4733 Obstructive sleep apnea (adult) (pediatric): Secondary | ICD-10-CM | POA: Diagnosis not present

## 2023-12-03 DIAGNOSIS — M25561 Pain in right knee: Secondary | ICD-10-CM | POA: Diagnosis present

## 2023-12-03 DIAGNOSIS — E039 Hypothyroidism, unspecified: Secondary | ICD-10-CM | POA: Insufficient documentation

## 2023-12-03 HISTORY — PX: TOTAL KNEE ARTHROPLASTY: SHX125

## 2023-12-03 SURGERY — ARTHROPLASTY, KNEE, TOTAL
Anesthesia: Spinal | Site: Knee | Laterality: Right

## 2023-12-03 MED ORDER — DEXAMETHASONE SODIUM PHOSPHATE 10 MG/ML IJ SOLN
INTRAMUSCULAR | Status: DC | PRN
  Administered 2023-12-03: 10 mg via INTRAVENOUS

## 2023-12-03 MED ORDER — SODIUM CHLORIDE 0.9 % IV SOLN: Freq: Once | INTRAVENOUS | Status: AC

## 2023-12-03 MED ORDER — METHOCARBAMOL 1000 MG/10ML IJ SOLN: 500.0000 mg | Freq: Once | INTRAMUSCULAR | Status: DC | PRN

## 2023-12-03 MED ORDER — CEFAZOLIN SODIUM-DEXTROSE 3-4 GM/150ML-% IV SOLN
3.0000 g | INTRAVENOUS | Status: AC
  Administered 2023-12-03: 3 g via INTRAVENOUS
  Filled 2023-12-03: qty 150

## 2023-12-03 MED ORDER — OXYCODONE HCL 5 MG/5ML PO SOLN: 5.0000 mg | Freq: Once | ORAL | Status: DC | PRN

## 2023-12-03 MED ORDER — FENTANYL CITRATE (PF) 100 MCG/2ML IJ SOLN
INTRAMUSCULAR | Status: AC
  Filled 2023-12-03: qty 2

## 2023-12-03 MED ORDER — CELECOXIB 100 MG PO CAPS
100.0000 mg | ORAL_CAPSULE | Freq: Two times a day (BID) | ORAL | Status: DC
  Administered 2023-12-03 – 2023-12-04 (×2): 100 mg via ORAL
  Filled 2023-12-03 (×2): qty 1

## 2023-12-03 MED ORDER — PHENYLEPHRINE 80 MCG/ML (10ML) SYRINGE FOR IV PUSH (FOR BLOOD PRESSURE SUPPORT)
PREFILLED_SYRINGE | INTRAVENOUS | Status: DC | PRN
  Administered 2023-12-03: 80 ug via INTRAVENOUS

## 2023-12-03 MED ORDER — CEFAZOLIN SODIUM-DEXTROSE 2-4 GM/100ML-% IV SOLN
2.0000 g | Freq: Three times a day (TID) | INTRAVENOUS | Status: AC
  Administered 2023-12-03 – 2023-12-04 (×2): 2 g via INTRAVENOUS
  Filled 2023-12-03 (×2): qty 100

## 2023-12-03 MED ORDER — METHOCARBAMOL 1000 MG/10ML IJ SOLN: 500.0000 mg | Freq: Four times a day (QID) | INTRAMUSCULAR | Status: DC | PRN

## 2023-12-03 MED ORDER — LACTATED RINGERS IV SOLN
INTRAVENOUS | Status: DC
Start: 2023-12-03 — End: 2023-12-03

## 2023-12-03 MED ORDER — ACETAMINOPHEN 500 MG PO TABS
1000.0000 mg | ORAL_TABLET | Freq: Once | ORAL | Status: AC
  Administered 2023-12-03: 1000 mg via ORAL
  Filled 2023-12-03: qty 2

## 2023-12-03 MED ORDER — KETAMINE HCL 50 MG/5ML IJ SOSY
PREFILLED_SYRINGE | INTRAMUSCULAR | Status: AC
  Filled 2023-12-03: qty 5

## 2023-12-03 MED ORDER — POVIDONE-IODINE 10 % EX SWAB
2.0000 | Freq: Once | CUTANEOUS | Status: AC
  Administered 2023-12-03: 2 via TOPICAL

## 2023-12-03 MED ORDER — MIDAZOLAM HCL 2 MG/2ML IJ SOLN
INTRAMUSCULAR | Status: AC
  Administered 2023-12-03: 2 mg via INTRAVENOUS
  Filled 2023-12-03: qty 2

## 2023-12-03 MED ORDER — SODIUM CHLORIDE 0.9% FLUSH
INTRAVENOUS | Status: DC | PRN
  Administered 2023-12-03: 20 mL via INTRADERMAL

## 2023-12-03 MED ORDER — LEVOTHYROXINE SODIUM 75 MCG PO TABS
75.0000 ug | ORAL_TABLET | Freq: Every day | ORAL | Status: DC
  Administered 2023-12-04: 75 ug via ORAL
  Filled 2023-12-03: qty 1

## 2023-12-03 MED ORDER — PANTOPRAZOLE SODIUM 40 MG PO TBEC
40.0000 mg | DELAYED_RELEASE_TABLET | Freq: Two times a day (BID) | ORAL | Status: DC
  Administered 2023-12-03 – 2023-12-04 (×2): 40 mg via ORAL
  Filled 2023-12-03 (×2): qty 1

## 2023-12-03 MED ORDER — VANCOMYCIN HCL 1000 MG IV SOLR
INTRAVENOUS | Status: AC
  Filled 2023-12-03: qty 20

## 2023-12-03 MED ORDER — MIDAZOLAM HCL 2 MG/2ML IJ SOLN: 2.0000 mg | Freq: Once | INTRAMUSCULAR | Status: AC

## 2023-12-03 MED ORDER — ASPIRIN 81 MG PO CHEW
81.0000 mg | CHEWABLE_TABLET | Freq: Two times a day (BID) | ORAL | Status: DC
  Administered 2023-12-03 – 2023-12-04 (×2): 81 mg via ORAL
  Filled 2023-12-03 (×2): qty 1

## 2023-12-03 MED ORDER — FENTANYL CITRATE (PF) 250 MCG/5ML IJ SOLN
INTRAMUSCULAR | Status: AC
  Filled 2023-12-03: qty 5

## 2023-12-03 MED ORDER — LIOTHYRONINE SODIUM 5 MCG PO TABS
5.0000 ug | ORAL_TABLET | Freq: Every day | ORAL | Status: DC
  Administered 2023-12-04: 5 ug via ORAL
  Filled 2023-12-03: qty 1

## 2023-12-03 MED ORDER — CHLORHEXIDINE GLUCONATE 0.12 % MT SOLN
15.0000 mL | Freq: Once | OROMUCOSAL | Status: AC
  Administered 2023-12-03: 15 mL via OROMUCOSAL
  Filled 2023-12-03: qty 15

## 2023-12-03 MED ORDER — BUPIVACAINE IN DEXTROSE 0.75-8.25 % IT SOLN
INTRATHECAL | Status: DC | PRN
  Administered 2023-12-03: 1.6 mL via INTRATHECAL

## 2023-12-03 MED ORDER — METOCLOPRAMIDE HCL 5 MG/ML IJ SOLN: 5.0000 mg | Freq: Three times a day (TID) | INTRAMUSCULAR | Status: DC | PRN

## 2023-12-03 MED ORDER — PHENOL 1.4 % MT LIQD: 1.0000 | OROMUCOSAL | Status: DC | PRN

## 2023-12-03 MED ORDER — LORATADINE 10 MG PO TABS
10.0000 mg | ORAL_TABLET | Freq: Every day | ORAL | Status: DC
Start: 2023-12-03 — End: 2023-12-04
  Administered 2023-12-03 – 2023-12-04 (×2): 10 mg via ORAL
  Filled 2023-12-03 (×2): qty 1

## 2023-12-03 MED ORDER — TRANEXAMIC ACID-NACL 1000-0.7 MG/100ML-% IV SOLN
INTRAVENOUS | Status: DC | PRN
  Administered 2023-12-03: 1000 mg via INTRAVENOUS

## 2023-12-03 MED ORDER — DEXMEDETOMIDINE HCL IN NACL 80 MCG/20ML IV SOLN
INTRAVENOUS | Status: DC | PRN
  Administered 2023-12-03 (×2): 4 ug via INTRAVENOUS
  Administered 2023-12-03: 44 ug via INTRAVENOUS
  Administered 2023-12-03 (×3): 4 ug via INTRAVENOUS

## 2023-12-03 MED ORDER — ONDANSETRON HCL 4 MG/2ML IJ SOLN: 4.0000 mg | Freq: Once | INTRAMUSCULAR | Status: DC | PRN

## 2023-12-03 MED ORDER — OXYBUTYNIN CHLORIDE ER 5 MG PO TB24
5.0000 mg | ORAL_TABLET | Freq: Every morning | ORAL | Status: DC
  Administered 2023-12-04: 5 mg via ORAL
  Filled 2023-12-03: qty 1

## 2023-12-03 MED ORDER — METHOCARBAMOL 500 MG PO TABS
500.0000 mg | ORAL_TABLET | Freq: Four times a day (QID) | ORAL | Status: DC | PRN
  Administered 2023-12-03 – 2023-12-04 (×2): 500 mg via ORAL
  Filled 2023-12-03 (×2): qty 1

## 2023-12-03 MED ORDER — PROPOFOL 10 MG/ML IV BOLUS
INTRAVENOUS | Status: AC
  Filled 2023-12-03: qty 40

## 2023-12-03 MED ORDER — FENTANYL CITRATE (PF) 100 MCG/2ML IJ SOLN
25.0000 ug | INTRAMUSCULAR | Status: DC | PRN
  Administered 2023-12-03 (×2): 50 ug via INTRAVENOUS

## 2023-12-03 MED ORDER — FENTANYL CITRATE (PF) 100 MCG/2ML IJ SOLN: 25.0000 ug | Freq: Once | INTRAMUSCULAR | Status: AC

## 2023-12-03 MED ORDER — ONDANSETRON HCL 4 MG/2ML IJ SOLN
INTRAMUSCULAR | Status: DC | PRN
  Administered 2023-12-03: 4 mg via INTRAVENOUS

## 2023-12-03 MED ORDER — 0.9 % SODIUM CHLORIDE (POUR BTL) OPTIME
TOPICAL | Status: DC | PRN
  Administered 2023-12-03: 4000 mL

## 2023-12-03 MED ORDER — BUPIVACAINE LIPOSOME 1.3 % IJ SUSP
INTRAMUSCULAR | Status: DC | PRN
  Administered 2023-12-03: 20 mL

## 2023-12-03 MED ORDER — OXYCODONE HCL 5 MG PO TABS: 5.0000 mg | ORAL_TABLET | Freq: Once | ORAL | Status: DC | PRN

## 2023-12-03 MED ORDER — POVIDONE-IODINE 10 % EX SWAB: 2.0000 | Freq: Once | CUTANEOUS | Status: DC

## 2023-12-03 MED ORDER — DOCUSATE SODIUM 100 MG PO CAPS
100.0000 mg | ORAL_CAPSULE | Freq: Two times a day (BID) | ORAL | Status: DC
  Administered 2023-12-03 – 2023-12-04 (×2): 100 mg via ORAL
  Filled 2023-12-03 (×2): qty 1

## 2023-12-03 MED ORDER — MORPHINE SULFATE (PF) 4 MG/ML IV SOLN
INTRAVENOUS | Status: AC
  Filled 2023-12-03: qty 2

## 2023-12-03 MED ORDER — BUPIVACAINE HCL (PF) 0.25 % IJ SOLN
INTRAMUSCULAR | Status: AC
  Filled 2023-12-03: qty 30

## 2023-12-03 MED ORDER — PROPOFOL 10 MG/ML IV BOLUS
INTRAVENOUS | Status: AC
  Filled 2023-12-03: qty 20

## 2023-12-03 MED ORDER — VANCOMYCIN HCL 1000 MG IV SOLR
INTRAVENOUS | Status: AC
Start: 2023-12-03 — End: 2023-12-03
  Filled 2023-12-03: qty 20

## 2023-12-03 MED ORDER — ONDANSETRON HCL 4 MG/2ML IJ SOLN
4.0000 mg | Freq: Four times a day (QID) | INTRAMUSCULAR | Status: DC | PRN
  Administered 2023-12-03: 4 mg via INTRAVENOUS
  Filled 2023-12-03: qty 2

## 2023-12-03 MED ORDER — MIDAZOLAM HCL 2 MG/2ML IJ SOLN
INTRAMUSCULAR | Status: DC | PRN
  Administered 2023-12-03: 2 mg via INTRAVENOUS

## 2023-12-03 MED ORDER — POVIDONE-IODINE 7.5 % EX SOLN
Freq: Once | CUTANEOUS | Status: DC
  Filled 2023-12-03: qty 118

## 2023-12-03 MED ORDER — ONDANSETRON HCL 4 MG PO TABS: 4.0000 mg | ORAL_TABLET | Freq: Four times a day (QID) | ORAL | Status: DC | PRN

## 2023-12-03 MED ORDER — PHENYLEPHRINE 80 MCG/ML (10ML) SYRINGE FOR IV PUSH (FOR BLOOD PRESSURE SUPPORT)
PREFILLED_SYRINGE | INTRAVENOUS | Status: AC
  Filled 2023-12-03: qty 10

## 2023-12-03 MED ORDER — ACETAMINOPHEN 10 MG/ML IV SOLN: 1000.0000 mg | Freq: Once | INTRAVENOUS | Status: DC | PRN

## 2023-12-03 MED ORDER — ROPIVACAINE HCL 5 MG/ML IJ SOLN
INTRAMUSCULAR | Status: DC | PRN
  Administered 2023-12-03: 20 mL via PERINEURAL

## 2023-12-03 MED ORDER — LACTATED RINGERS IV SOLN: INTRAVENOUS | Status: DC | PRN

## 2023-12-03 MED ORDER — DEXAMETHASONE SODIUM PHOSPHATE 10 MG/ML IJ SOLN
INTRAMUSCULAR | Status: AC
  Filled 2023-12-03: qty 1

## 2023-12-03 MED ORDER — FENTANYL CITRATE (PF) 100 MCG/2ML IJ SOLN
INTRAMUSCULAR | Status: AC
  Administered 2023-12-03: 25 ug via INTRAVENOUS
  Filled 2023-12-03: qty 2

## 2023-12-03 MED ORDER — SODIUM CHLORIDE 0.9 % IR SOLN
Status: DC | PRN
  Administered 2023-12-03: 3000 mL

## 2023-12-03 MED ORDER — PHENYLEPHRINE HCL-NACL 20-0.9 MG/250ML-% IV SOLN
INTRAVENOUS | Status: AC
  Filled 2023-12-03: qty 250

## 2023-12-03 MED ORDER — ACETAMINOPHEN 500 MG PO TABS
1000.0000 mg | ORAL_TABLET | Freq: Four times a day (QID) | ORAL | Status: DC
  Administered 2023-12-03 – 2023-12-04 (×2): 1000 mg via ORAL
  Filled 2023-12-03 (×2): qty 2

## 2023-12-03 MED ORDER — CLONIDINE HCL (ANALGESIA) 100 MCG/ML EP SOLN
EPIDURAL | Status: DC | PRN
  Administered 2023-12-03: 1 mL via INTRA_ARTICULAR

## 2023-12-03 MED ORDER — METOCLOPRAMIDE HCL 5 MG PO TABS: 5.0000 mg | ORAL_TABLET | Freq: Three times a day (TID) | ORAL | Status: DC | PRN

## 2023-12-03 MED ORDER — LIDOCAINE 2% (20 MG/ML) 5 ML SYRINGE
INTRAMUSCULAR | Status: AC
  Filled 2023-12-03: qty 5

## 2023-12-03 MED ORDER — PROPOFOL 10 MG/ML IV BOLUS
INTRAVENOUS | Status: DC | PRN
  Administered 2023-12-03 (×2): 50 mg via INTRAVENOUS
  Administered 2023-12-03: 100 ug/kg/min via INTRAVENOUS

## 2023-12-03 MED ORDER — PROPRANOLOL HCL 40 MG PO TABS
80.0000 mg | ORAL_TABLET | Freq: Every day | ORAL | Status: DC
  Administered 2023-12-04: 80 mg via ORAL
  Filled 2023-12-03: qty 2
  Filled 2023-12-03: qty 4

## 2023-12-03 MED ORDER — BUPIVACAINE LIPOSOME 1.3 % IJ SUSP
INTRAMUSCULAR | Status: AC
  Filled 2023-12-03: qty 20

## 2023-12-03 MED ORDER — MIDAZOLAM HCL 2 MG/2ML IJ SOLN
INTRAMUSCULAR | Status: AC
  Filled 2023-12-03: qty 2

## 2023-12-03 MED ORDER — HYDROMORPHONE HCL 1 MG/ML IJ SOLN: 0.5000 mg | INTRAMUSCULAR | Status: DC | PRN

## 2023-12-03 MED ORDER — ACETAMINOPHEN 325 MG PO TABS: 325.0000 mg | ORAL_TABLET | Freq: Four times a day (QID) | ORAL | Status: DC | PRN

## 2023-12-03 MED ORDER — OXYCODONE HCL 5 MG PO TABS
5.0000 mg | ORAL_TABLET | ORAL | Status: DC | PRN
  Administered 2023-12-03 – 2023-12-04 (×4): 10 mg via ORAL
  Filled 2023-12-03 (×4): qty 2

## 2023-12-03 MED ORDER — MENTHOL 3 MG MT LOZG: 1.0000 | LOZENGE | OROMUCOSAL | Status: DC | PRN

## 2023-12-03 MED ORDER — MORPHINE SULFATE (PF) 4 MG/ML IV SOLN
INTRAVENOUS | Status: DC | PRN
  Administered 2023-12-03: 8 mg via INTRAMUSCULAR

## 2023-12-03 MED ORDER — TRANEXAMIC ACID-NACL 1000-0.7 MG/100ML-% IV SOLN
1000.0000 mg | INTRAVENOUS | Status: DC
  Filled 2023-12-03: qty 100

## 2023-12-03 MED ORDER — VANCOMYCIN HCL 1000 MG IV SOLR
INTRAVENOUS | Status: DC | PRN
  Administered 2023-12-03: 1000 mg via TOPICAL

## 2023-12-03 MED ORDER — IRRISEPT - 450ML BOTTLE WITH 0.05% CHG IN STERILE WATER, USP 99.95% OPTIME
TOPICAL | Status: DC | PRN
  Administered 2023-12-03: 450 mL

## 2023-12-03 MED ORDER — BUPIVACAINE HCL 0.25 % IJ SOLN
INTRAMUSCULAR | Status: DC | PRN
  Administered 2023-12-03: 20 mL
  Administered 2023-12-03: 10 mL

## 2023-12-03 MED ORDER — TRANEXAMIC ACID 1000 MG/10ML IV SOLN
INTRAVENOUS | Status: DC | PRN
  Administered 2023-12-03: 2000 mg via TOPICAL

## 2023-12-03 MED ORDER — ONDANSETRON HCL 4 MG/2ML IJ SOLN
INTRAMUSCULAR | Status: AC
  Filled 2023-12-03: qty 2

## 2023-12-03 MED ORDER — DROPERIDOL 2.5 MG/ML IJ SOLN: 0.6250 mg | Freq: Once | INTRAMUSCULAR | Status: DC | PRN

## 2023-12-03 MED ORDER — ORAL CARE MOUTH RINSE: 15.0000 mL | Freq: Once | OROMUCOSAL | Status: AC

## 2023-12-03 SURGICAL SUPPLY — 67 items
BAG COUNTER SPONGE SURGICOUNT (BAG) ×2 IMPLANT
BAG DECANTER FOR FLEXI CONT (MISCELLANEOUS) ×2 IMPLANT
BANDAGE ESMARK 6X9 LF (GAUZE/BANDAGES/DRESSINGS) ×2 IMPLANT
BLADE SAG 18X100X1.27 (BLADE) ×2 IMPLANT
BLADE SAGITTAL 25.0X1.27X90 (BLADE) IMPLANT
BLADE SAW SAG 90X13X1.27 (BLADE) ×2 IMPLANT
BNDG COHESIVE 6X5 TAN ST LF (GAUZE/BANDAGES/DRESSINGS) ×2 IMPLANT
BNDG ELASTIC 6X15 VLCR STRL LF (GAUZE/BANDAGES/DRESSINGS) ×2 IMPLANT
BOWL SMART MIX CTS (DISPOSABLE) IMPLANT
CNTNR URN SCR LID CUP LEK RST (MISCELLANEOUS) ×2 IMPLANT
COMPONENT FEM PS KNEE NRW 8 RT (Joint) IMPLANT
COMPONENT PATELLA 3 PEG 35 (Joint) IMPLANT
COVER SURGICAL LIGHT HANDLE (MISCELLANEOUS) ×2 IMPLANT
CUFF TOURN SGL QUICK 42 (TOURNIQUET CUFF) IMPLANT
CUFF TRNQT CYL 34X4.125X (TOURNIQUET CUFF) ×2 IMPLANT
DRAPE INCISE IOBAN 66X45 STRL (DRAPES) IMPLANT
DRAPE SURG ORHT 6 SPLT 77X108 (DRAPES) ×6 IMPLANT
DRAPE U-SHAPE 47X51 STRL (DRAPES) ×2 IMPLANT
DRSG AQUACEL AG ADV 3.5X14 (GAUZE/BANDAGES/DRESSINGS) IMPLANT
DURAPREP 26ML APPLICATOR (WOUND CARE) ×4 IMPLANT
ELECT CAUTERY BLADE 6.4 (BLADE) ×2 IMPLANT
ELECTRODE REM PT RTRN 9FT ADLT (ELECTROSURGICAL) ×2 IMPLANT
GAUZE SPONGE 4X4 12PLY STRL (GAUZE/BANDAGES/DRESSINGS) ×2 IMPLANT
GLOVE BIOGEL PI IND STRL 6.5 (GLOVE) ×2 IMPLANT
GLOVE BIOGEL PI IND STRL 7.0 (GLOVE) ×2 IMPLANT
GLOVE BIOGEL PI IND STRL 8 (GLOVE) ×2 IMPLANT
GLOVE ECLIPSE 7.0 STRL STRAW (GLOVE) ×2 IMPLANT
GLOVE ECLIPSE 8.0 STRL XLNG CF (GLOVE) ×2 IMPLANT
GLOVE SURG ENC MOIS LTX SZ6.5 (GLOVE) ×6 IMPLANT
GOWN STRL REUS W/ TWL LRG LVL3 (GOWN DISPOSABLE) ×6 IMPLANT
HOOD PEEL AWAY T7 (MISCELLANEOUS) ×6 IMPLANT
IMMOBILIZER KNEE 20 THIGH 36 (SOFTGOODS) IMPLANT
IMMOBILIZER KNEE 22 UNIV (SOFTGOODS) IMPLANT
IMMOBILIZER KNEE 24 THIGH 36 (SOFTGOODS) IMPLANT
INSERT TIB PERS SZ 8-9 10 RT (Insert) IMPLANT
KIT BASIN OR (CUSTOM PROCEDURE TRAY) ×2 IMPLANT
KIT TURNOVER KIT B (KITS) ×2 IMPLANT
LAVAGE JET IRRISEPT WOUND (IRRIGATION / IRRIGATOR) IMPLANT
MANIFOLD NEPTUNE II (INSTRUMENTS) ×2 IMPLANT
NDL 22X1.5 STRL (OR ONLY) (MISCELLANEOUS) ×4 IMPLANT
NDL SPNL 18GX3.5 QUINCKE PK (NEEDLE) ×2 IMPLANT
NEEDLE 22X1.5 STRL (OR ONLY) (MISCELLANEOUS) ×2 IMPLANT
NEEDLE SPNL 18GX3.5 QUINCKE PK (NEEDLE) ×1 IMPLANT
NS IRRIG 1000ML POUR BTL (IV SOLUTION) ×4 IMPLANT
PACK TOTAL JOINT (CUSTOM PROCEDURE TRAY) ×2 IMPLANT
PAD ARMBOARD POSITIONER FOAM (MISCELLANEOUS) ×4 IMPLANT
PAD CAST 4YDX4 CTTN HI CHSV (CAST SUPPLIES) ×2 IMPLANT
PADDING CAST COTTON 6X4 STRL (CAST SUPPLIES) ×2 IMPLANT
PIN DRILL HDLS TROCAR 75 4PK (PIN) IMPLANT
SCREW FEMALE HEX FIX 25X2.5 (ORTHOPEDIC DISPOSABLE SUPPLIES) IMPLANT
SET HNDPC FAN SPRY TIP SCT (DISPOSABLE) ×2 IMPLANT
SPIKE FLUID TRANSFER (MISCELLANEOUS) ×2 IMPLANT
STEM TIB PS KNEE D 0D RT (Stem) IMPLANT
STRIP CLOSURE SKIN 1/2X4 (GAUZE/BANDAGES/DRESSINGS) ×4 IMPLANT
SUCTION TUBE FRAZIER 10FR DISP (SUCTIONS) ×2 IMPLANT
SUT MNCRL AB 3-0 PS2 18 (SUTURE) ×2 IMPLANT
SUT VIC AB 0 CT1 27XBRD ANBCTR (SUTURE) ×6 IMPLANT
SUT VIC AB 1 CT1 36 (SUTURE) ×10 IMPLANT
SUT VIC AB 2-0 CT2 27 (SUTURE) ×8 IMPLANT
SUT VICRYL 0 27 CT2 27 ABS (SUTURE) ×2 IMPLANT
SYR 30ML LL (SYRINGE) ×6 IMPLANT
SYR TB 1ML LUER SLIP (SYRINGE) ×2 IMPLANT
TOWEL GREEN STERILE (TOWEL DISPOSABLE) ×4 IMPLANT
TOWEL GREEN STERILE FF (TOWEL DISPOSABLE) ×4 IMPLANT
TRAY CATH INTERMITTENT SS 16FR (CATHETERS) IMPLANT
WATER STERILE IRR 1000ML POUR (IV SOLUTION) IMPLANT
YANKAUER SUCT BULB TIP NO VENT (SUCTIONS) ×2 IMPLANT

## 2023-12-03 NOTE — H&P (Addendum)
 TOTAL KNEE ADMISSION H&P  Patient is being admitted for right total knee arthroplasty.  Subjective:  Chief Complaint:right knee pain.  HPI: [name] [name], 53 y.o. female, has a history of pain and functional disability in the right knee due to arthritis and has failed non-surgical conservative treatments for greater than 12 weeks to includeNSAID's and/or analgesics, corticosteriod injections, flexibility and strengthening excercises, and activity modification.  Onset of symptoms was abrupt, starting 1 years ago with rapidlly worsening course since that time. The patient noted no past surgery on the right knee(s).  Patient currently rates pain in the right knee(s) at 8 out of 10 with activity. Patient has night pain, worsening of pain with activity and weight bearing, pain that interferes with activities of daily living, pain with passive range of motion, crepitus, and joint swelling.  Patient has evidence of subchondral sclerosis and joint space narrowing by imaging studies. This patient has had an MRI scan which shows patellofemoral and medial compartment arthritis along with a meniscal root tear which does not look fixable.  Patient also has a very physical job.  No personal or family history of DVT or pulmonary embolism. There is no active infection.  Patient Active Problem List   Diagnosis Date Noted   Chronic rhinitis 02/01/2020   S/P right rotator cuff repair 04/16/2019   Gastroesophageal reflux disease without esophagitis 07/21/2018   Hidradenitis suppurativa 10/01/2016   OSA (obstructive sleep apnea) 05/13/2015   Morbid obesity (HCC) 05/13/2015   Hypothyroidism 03/27/2014   Panic attacks 03/27/2014   SVT (supraventricular tachycardia) 06/26/2012   Migraines 06/26/2012   Past Medical History:  Diagnosis Date   Allergy    SEASONAL   Anxiety    Chronic headaches    Colon polyps    GERD (gastroesophageal reflux disease)    History of kidney stones    Hyperlipidemia    Hypertension     Hypothyroidism    Palpitations    Sleep apnea     Past Surgical History:  Procedure Laterality Date   CESAREAN SECTION     1194   COLONOSCOPY     KNEE ARTHROSCOPY     LITHOTRIPSY  12/2014   ROTATOR CUFF REPAIR Right 2021    Current Facility-Administered Medications  Medication Dose Route Frequency Provider Last Rate Last Admin   ceFAZolin  (ANCEF ) IVPB 3g/150 mL premix  3 g Intravenous On Call to OR Addie Cordella Hamilton, MD       lactated ringers  infusion   Intravenous Continuous Epifanio Fallow, MD 10 mL/hr at 12/03/23 0944 New Bag at 12/03/23 0944   povidone-iodine  (BETADINE ) 7.5 % scrub   Topical Once Magnant, Charles L, PA-C       povidone-iodine  10 % swab 2 Application  2 Application Topical Once Magnant, Charles L, PA-C       tranexamic acid  (CYKLOKAPRON ) 2,000 mg in sodium chloride  0.9 % 50 mL Topical Application  2,000 mg Topical To OR Addie, Cordella Hamilton, MD       tranexamic acid  (CYKLOKAPRON ) IVPB 1,000 mg  1,000 mg Intravenous To OR Magnant, Charles L, PA-C       Allergies  Allergen Reactions   Chlorpheniramine Palpitations    insomnia   Pseudoephedrine  Hcl Palpitations and Other (See Comments)    insomnia    Social History   Tobacco Use   Smoking status: Former    Current packs/day: 0.00    Average packs/day: 0.5 packs/day for 10.1 years (5.0 ttl pk-yrs)    Types: Cigarettes  Start date: 03/12/2008    Quit date: 04/05/2018    Years since quitting: 5.6   Smokeless tobacco: Never  Substance Use Topics   Alcohol use: Yes    Alcohol/week: 0.0 standard drinks of alcohol    Comment: rare    Family History  Problem Relation Age of Onset   Hypertension Mother    Lung cancer Father        died from   Heart disease Maternal Grandmother    Heart disease Maternal Grandfather    Breast cancer Paternal Grandmother        paternal aunts x2   Colon cancer Neg Hx    Esophageal cancer Neg Hx    Rectal cancer Neg Hx    Stomach cancer Neg Hx    Colon polyps Neg  Hx      Review of Systems  Musculoskeletal:  Positive for arthralgias.  All other systems reviewed and are negative.   Objective:  Physical Exam Vitals reviewed.  HENT:     Head: Normocephalic.     Nose: Nose normal.     Mouth/Throat:     Mouth: Mucous membranes are moist.  Eyes:     Pupils: Pupils are equal, round, and reactive to light.  Cardiovascular:     Rate and Rhythm: Normal rate.     Pulses: Normal pulses.  Pulmonary:     Effort: Pulmonary effort is normal.  Abdominal:     General: Abdomen is flat.  Musculoskeletal:     Cervical back: Normal range of motion.  Skin:    General: Skin is warm.     Capillary Refill: Capillary refill takes less than 2 seconds.  Neurological:     General: No focal deficit present.     Mental Status: [name] alert.  Psychiatric:        Mood and Affect: Mood normal.     Ortho exam demonstrates right knee with 0 degrees extension and 120 degrees of knee flexion.  Positive effusion.  2+ DP pulse of the right lower extremity.  No calf tenderness.  Negative Homans' sign.  No cellulitis or skin changes noted around the right knee region.  No pain with hip range of motion.  Able to perform straight leg raise without extensor lag.  Stable to anterior and posterior drawer sign.  Stable to varus and valgus stress at 0 and 30 degrees.  Vital signs in last 24 hours: Temp:  [98 F (36.7 C)] 98 F (36.7 C) (09/23 0903) Pulse Rate:  [73] 73 (09/23 0903) Resp:  [19] 19 (09/23 0903) BP: (147)/(87) 147/87 (09/23 0903) SpO2:  [93 %] 93 % (09/23 0903) Weight:  [126.6 kg] 126.6 kg (09/23 0903)  Labs:   Estimated body mass index is 45.03 kg/m as calculated from the following:   Height as of this encounter: 5' 6 (1.676 m).   Weight as of this encounter: 126.6 kg.   Imaging Review Plain radiographs demonstrate moderate degenerative joint disease of the right knee(s). The overall alignment isneutral. The bone quality appears to be good for age and  reported activity level.      Assessment/Plan:  End stage arthritis, right knee   The patient history, physical examination, clinical judgment of the provider and imaging studies are consistent with end stage degenerative joint disease of the right knee(s) and total knee arthroplasty is deemed medically necessary. The treatment options including medical management, injection therapy arthroscopy and arthroplasty were discussed at length. The risks and benefits of total knee  arthroplasty were presented and reviewed. The risks due to aseptic loosening, infection, stiffness, patella tracking problems, thromboembolic complications and other imponderables were discussed. The patient acknowledged the explanation, agreed to proceed with the plan and consent was signed. Patient is being admitted for inpatient treatment for surgery, pain control, PT, OT, prophylactic antibiotics, VTE prophylaxis, progressive ambulation and ADL's and discharge planning. The patient is planning to be discharged home with home health services     Patient's anticipated LOS is less than 2 midnights, meeting these requirements: - Younger than 7 - Lives within 1 hour of care - Has a competent adult at home to recover with post-op recover - NO history of  - Chronic pain requiring opiods  - Diabetes  - Coronary Artery Disease  - Heart failure  - Heart attack  - Stroke  - DVT/VTE  - Cardiac arrhythmia  - Respiratory Failure/COPD  - Renal failure  - Anemia  - Advanced Liver disease

## 2023-12-03 NOTE — Plan of Care (Signed)

## 2023-12-03 NOTE — Op Note (Unsigned)
 NAMEKHALIAH, BARNICK MEDICAL RECORD NO: [id number] ACCOUNT NO: [id number] DATE OF BIRTH: 1970-03-22 FACILITY: [facility] LOCATION: [facility] PHYSICIAN: Cordella RAMAN. Addie, MD  Operative Report   DATE OF PROCEDURE: 12/03/2023  PREOPERATIVE DIAGNOSIS:  Right knee arthritis.  POSTOPERATIVE DIAGNOSIS:  Right knee arthritis.  PROCEDURE:  Right total knee replacement using Persona press-fit cruciate retaining size 8 narrow femur with size D press-fit spiked keel tibia, 35 mm, 9.5 thickness, 3-peg patella and 10-mm medial congruent polyethylene insert.  SURGEON:  Cordella RAMAN. Addie, MD  ASSISTANT:  Herlene Calix, PA.  INDICATIONS:  The patient is a 53 year old patient with right knee pain, swelling, and arthritis refractory to nonoperative management who presents for operative management after explanation of risks and benefits.  DESCRIPTION OF PROCEDURE:  The patient was brought to the operating room where spinal anesthetic was induced.  Preoperative antibiotics were administered.  A timeout was called.  The right leg was prescrubbed with alcohol and Betadine  and allowed to air  dry.  Prepped with DuraPrep solution and draped in a sterile manner.  Ioban was used to cover the operative field.  The leg was elevated and exsanguinated with an Esmarch wrap.  Tourniquet was inflated.  An anterior approach to the knee was made.   IrriSept solution was utilized.  A median parapatellar arthrotomy was made and marked with #1 Vicryl suture.  Significant effusion was present.  The patella was everted.  The fat pad was partially excised.  The lateral patellofemoral ligament was  released.  Soft tissue was removed from the anterior distal femur.  Medial soft tissue dissection was performed, which was minimal based on the patient's neutral alignment.  At this time, the knee was flexed and the patella was everted.  Next, the patient did have end-stage arthritis on both the medial femoral condyle and lateral femoral condyle with  corresponding lesions on the medial and lateral tibial plateau down to subchondral bone.  The patellofemoral joint also had significant  grade 3-4 chondromalacia particularly on the undersurface of the patella.  The ACL was released.  The anterior horn of the lateral meniscus was released.  At this point, collateral ligament retractors and a posterior retractor were placed.  The PCL was  recessed.  The intramedullary alignment was then used to make a cut of 10 mm off the least affected lateral tibial plateau.  The cut was measured at 9 mm.  We later revised it to 2 more mm.  The femur was cut in 4 degrees of valgus with a 10 mm cut.  With those  cuts, the patient had full extension with good stability in extension to varus and valgus stress.  Next, the femur was then cut in 3 degrees of external rotation, which gave symmetric flexion and extension gaps.  Size 8 did not give any notching.   Anterior, posterior, and chamfer cuts were made.  The tibia was then placed in alignment with the medial third of the tibial tubercle.  We then did trial reduction with a 10 mm spacer.  The patient did achieve full extension, full flexion, no lift-off  and very good patellar tracking.  It should be noted that the patella was cut down from 22 down to 12 mm.  A 35 mm patellar button was placed.  With that patellar button in position and the trial components in position, the patient had full extension,  full flexion, and no lift-off and excellent patellar tracking.  Final preparation was made on both the femur and the tibia.  Trial components were removed.  3 liters of pulsatile irrigating solution was utilized.  We then anesthetized the capsule using Marcaine , Exparel  and saline.  We then let a TXA sponge sit along  with IrriSept solution for 3 minutes in the knee.  At this time, this was removed and then IrriSept solution was used in the tibial canal.  Vancomycin  powder was placed in the tibial canal.  The true  components were then tapped into position with  excellent press fit and the same stability parameters maintained.  The tourniquet was released.  Bleeding points were encountered controlled using electrocautery.  Pouring irrigation x3 liters was then utilized.  The median parapatellar arthrotomy was  closed over a bolster using #1 Vicryl suture.  Prior to final closure, we did irrigate out the knee joint one more time with IrriSept solution and then placed in the vancomycin  powder.  We closed up the arthrotomy with #1 suture and then injected  Marcaine , morphine , and clonidine  into the knee.  The surface on top of the arthrotomy was then irrigated and closed using 0 Vicryl suture, 2-0 Vicryl suture, and 3-0 Monocryl with Steri-Strips, Aquacel dressing, Ace wrap, knee immobilizer, and Iceman  placed.  The patient tolerated the procedure well without immediate complications and was transferred to the recovery room in stable condition.  Luke's assistance was required at all times for retraction, opening, closing, and mobilization of tissue.  His assistance was of medical necessity.   PUS D: 12/03/2023 3:17:49 pm T: 12/03/2023 3:50:00 pm  JOB: [id number]/ [id number]

## 2023-12-03 NOTE — Anesthesia Procedure Notes (Signed)
 Anesthesia Regional Block: Adductor canal block   Pre-Anesthetic Checklist: , timeout performed,  Correct Patient, Correct Site, Correct Laterality,  Correct Procedure, Correct Position, site marked,  Risks and benefits discussed,  Surgical consent,  Pre-op evaluation,  At surgeon's request and post-op pain management  Laterality: Lower and Right  Prep: chloraprep       Needles:  Injection technique: Single-shot  Needle Type: Echogenic Stimulator Needle     Needle Length: 9cm  Needle Gauge: 22     Additional Needles:   Procedures:,,,, ultrasound used (permanent image in chart),,    Narrative:  Start time: 12/03/2023 11:28 AM End time: 12/03/2023 11:28 AM Injection made incrementally with aspirations every 5 mL.  Performed by: Personally  Anesthesiologist: Waddell Lauraine NOVAK, MD

## 2023-12-03 NOTE — Anesthesia Procedure Notes (Signed)
 Spinal  Patient location during procedure: OR Start time: 12/03/2023 12:25 PM End time: 12/03/2023 12:25 PM Reason for block: surgical anesthesia Staffing Performed: anesthesiologist  Anesthesiologist: Waddell Lauraine NOVAK, MD Performed by: Waddell Lauraine NOVAK, MD Authorized by: Waddell Lauraine NOVAK, MD   Preanesthetic Checklist Completed: patient identified, IV checked, site marked, risks and benefits discussed, surgical consent, monitors and equipment checked, pre-op evaluation and timeout performed Spinal Block Patient position: sitting Prep: DuraPrep Patient monitoring: heart rate, cardiac monitor, continuous pulse ox and blood pressure Approach: midline Location: L3-4 Injection technique: single-shot Needle Needle type: Pencan  Needle gauge: 24 G Needle length: 9 cm Assessment Events: CSF return Additional Notes Uneventful spinal block performed in the OR. CSF aspirated before and after injection.  CANDIE Mcbride, MD

## 2023-12-03 NOTE — Brief Op Note (Signed)
   12/03/2023  3:08 PM  PATIENT:  [name] [name]  53 y.o. female  PRE-OPERATIVE DIAGNOSIS:  right knee osteoarthritis  POST-OPERATIVE DIAGNOSIS:  right knee osteoarthritis  PROCEDURE:  Procedure(s): ARTHROPLASTY, KNEE, TOTAL  SURGEON:  Surgeon(s): Addie, Cordella Hamilton, MD  ASSISTANT: magnant pa  ANESTHESIA:   spinal  EBL: 125 ml    Total I/O In: 1000 [I.V.:1000] Out: -   BLOOD ADMINISTERED: none  DRAINS: none   LOCAL MEDICATIONS USED: Marcaine  morphine  clonidine  Exparel  vancomycin  powder  SPECIMEN:  No Specimen  COUNTS:  YES  TOURNIQUET:   Total Tourniquet Time Documented: Thigh (Right) - 79 minutes Total: Thigh (Right) - 79 minutes   DICTATION: [id number] PLAN OF CARE: Admit for overnight observation  PATIENT DISPOSITION:  PACU - hemodynamically stable

## 2023-12-03 NOTE — Anesthesia Procedure Notes (Signed)
 Procedure Name: MAC Date/Time: 12/03/2023 12:40 PM  Performed by: Obadiah Reyes BROCKS, CRNAPre-anesthesia Checklist: Patient identified, Emergency Drugs available, Suction available, Patient being monitored and Timeout performed Patient Re-evaluated:Patient Re-evaluated prior to induction Oxygen Delivery Method: Simple face mask Preoxygenation: Pre-oxygenation with 100% oxygen Induction Type: IV induction Airway Equipment and Method: Oral airway

## 2023-12-03 NOTE — Progress Notes (Signed)
 Orthopedic Tech Progress Note Patient Details:  [name] [name] November 06, 1970 [id number] Applied CPM and gave instructions on when to use bone foam per order.  CPM Right Knee CPM Right Knee: On Right Knee Flexion (Degrees): 40 Right Knee Extension (Degrees): 10  Post Interventions Patient Tolerated: Well Instructions Provided: Adjustment of device, Care of device, Poper ambulation with device Ortho Devices Type of Ortho Device: CPM padding, Bone foam zero knee Ortho Device/Splint Location: RLE Ortho Device/Splint Interventions: Ordered, Application, Adjustment   Post Interventions Patient Tolerated: Well Instructions Provided: Adjustment of device, Care of device, Poper ambulation with device  Morna Pink 12/03/2023, 7:42 PM

## 2023-12-03 NOTE — Transfer of Care (Signed)
 Immediate Anesthesia Transfer of Care Note  Patient: [name] [name]  Procedure(s) Performed: ARTHROPLASTY, KNEE, TOTAL (Right: Knee)  Patient Location: PACU  Anesthesia Type:MAC, Regional, and Spinal  Level of Consciousness: awake, alert , and oriented  Airway & Oxygen Therapy: Patient Spontanous Breathing and Patient connected to face mask oxygen  Post-op Assessment: Report given to RN and Post -op Vital signs reviewed and stable  Post vital signs: Reviewed and stable  Last Vitals:  Vitals Value Taken Time  BP 140/90 12/03/23 15:36  Temp    Pulse 82 12/03/23 15:39  Resp 17 12/03/23 15:39  SpO2 91 % 12/03/23 15:39  Vitals shown include unfiled device data.  Last Pain:  Vitals:   12/03/23 0940  TempSrc:   PainSc: 3       Patients Stated Pain Goal: 4 (12/03/23 0940)  Complications: No notable events documented.

## 2023-12-04 ENCOUNTER — Other Ambulatory Visit (HOSPITAL_COMMUNITY): Payer: Self-pay

## 2023-12-04 ENCOUNTER — Encounter (HOSPITAL_COMMUNITY): Payer: Self-pay | Admitting: Orthopedic Surgery

## 2023-12-04 DIAGNOSIS — R269 Unspecified abnormalities of gait and mobility: Secondary | ICD-10-CM | POA: Diagnosis not present

## 2023-12-04 DIAGNOSIS — Z96651 Presence of right artificial knee joint: Secondary | ICD-10-CM | POA: Diagnosis not present

## 2023-12-04 DIAGNOSIS — M1711 Unilateral primary osteoarthritis, right knee: Secondary | ICD-10-CM | POA: Diagnosis not present

## 2023-12-04 MED ORDER — ASPIRIN 81 MG PO CHEW: 81.0000 mg | CHEWABLE_TABLET | Freq: Two times a day (BID) | ORAL | 0 refills | Status: DC

## 2023-12-04 MED ORDER — OXYCODONE HCL 5 MG PO TABS: 5.0000 mg | ORAL_TABLET | ORAL | 0 refills | Status: DC | PRN

## 2023-12-04 MED ORDER — ACETAMINOPHEN 325 MG PO TABS: 325.0000 mg | ORAL_TABLET | Freq: Four times a day (QID) | ORAL | 0 refills | Status: AC | PRN

## 2023-12-04 MED ORDER — DOCUSATE SODIUM 100 MG PO CAPS: 100.0000 mg | ORAL_CAPSULE | Freq: Two times a day (BID) | ORAL | 0 refills | Status: DC

## 2023-12-04 NOTE — TOC Transition Note (Signed)
 Transition of Care [facility]) - Discharge Note   Patient Details  Name: [name] [name] MRN: [id number] Date of Birth: 06-Aug-1970  Transition of Care [facility]) CM/SW Contact:  Rosalva Jon Bloch, RN Phone Number: 12/04/2023, 11:12 AM   Clinical Narrative:    Patient will DC to: home Anticipated DC date: 12/04/2023 Family notified: yes Transport by: car       - s/p R TKA  9/23  Per MD patient ready for DC today pending therapy clearance. RN, patient, and patient's family  notified of DC. Order noted for home health and DME needs. Pt agreeable. Pt without provider preference. Referral made with [facility] for home health PT services and accepted/ Referral made with [facility] for DME. Equipment will be delivered to bedside prior to d/c. Pt without RX med concerns or transportation issues. Post hospital f/u noted on AVS.  Son to provide transportation to home.  RNCM will sign off for now as intervention is no longer needed. Please consult us  again if new needs arise.   Final next level of care: Home w Home Health Services Barriers to Discharge: No Barriers Identified   Patient Goals and CMS Choice     Choice offered to / list presented to : Patient      Discharge Placement                       Discharge Plan and Services Additional resources added to the After Visit Summary for     Discharge Planning Services: CM Consult            DME Arranged: Bedside commode, Walker rolling DME Agency: [facility] Date DME Agency Contacted: 12/04/23 Time DME Agency Contacted: 1107 Representative spoke with at DME Agency: Ryan CHEADLE Arranged: PT   Date [facility] Agency Contacted: 12/04/23 Time HH Agency Contacted: 1107    Social Drivers of Health (SDOH) Interventions SDOH Screenings   Food Insecurity: No Food Insecurity (12/04/2023)  Housing: Low Risk  (12/04/2023)  Transportation Needs: No Transportation Needs (12/04/2023)  Utilities: Not At Risk (12/04/2023)  Depression (PHQ2-9): Low  Risk  (11/12/2023)  Financial Resource Strain: Low Risk  (10/09/2023)  Physical Activity: Inactive (10/09/2023)  Social Connections: Moderately Integrated (10/09/2023)  Stress: No Stress Concern Present (10/09/2023)  Tobacco Use: Medium Risk (12/03/2023)  Health Literacy: Low Risk  (04/20/2020)   Received from [facility]     Readmission Risk Interventions     No data to display

## 2023-12-04 NOTE — Evaluation (Signed)
 Physical Therapy Evaluation Patient Details Name: [name] [name] MRN: [id number] DOB: Mar 20, 1970 Today's Date: 12/04/2023  History of Present Illness  [name] [name] is a 53 y.o. female who presented to [facility] hospital for elective R TKA 12/03/23. PMHx: SVT, HTN, HLD, hypothyroidism, GERD, OSA, and morbid obesity.  Clinical Impression  Pt admitted with above diagnosis. PTA, pt was independent for functional mobility, ADLs, IADLs, and driving. She lives with her mother and son in a one story house with 1 small step to enter and no railings. Pt currently with functional limitations due to the deficits listed below (see PT Problem List). She required supervision for safety with bed mobility, transfers, and gait using AD and CGA for stairs. Pt ambulated a household distance using RW. She completed 3 short steps ascending with LLE and descending with RLE and HHA. Discussed how her family should be positioned to support her during functional mobility. Educated pt on R TKA HEP and provided handout. She demonstrated understanding by performing a few exercises. Pt will benefit from acute skilled PT to increase her independence and safety with mobility to allow discharge.        If plan is discharge home, recommend the following: A little help with walking and/or transfers;A little help with bathing/dressing/bathroom;Assistance with cooking/housework;Assist for transportation;Help with stairs or ramp for entrance   Can travel by private vehicle        Equipment Recommendations Rolling walker (2 wheels);BSC/3in1  Recommendations for Other Services       Functional Status Assessment Patient has had a recent decline in their functional status and demonstrates the ability to make significant improvements in function in a reasonable and predictable amount of time.     Precautions / Restrictions Precautions Precautions: Knee;Fall Precaution Booklet Issued: Yes (comment) Recall of Precautions/Restrictions:  Intact Precaution/Restrictions Comments: Educated pt on knee positioning considerations, TKA HEP, use of CPM and zero degree bone foam. Required Braces or Orthoses: Knee Immobilizer - Right Restrictions Weight Bearing Restrictions Per Provider Order: Yes RLE Weight Bearing Per Provider Order: Weight bearing as tolerated      Mobility  Bed Mobility Overal bed mobility: Needs Assistance Bed Mobility: Supine to Sit, Sit to Supine     Supine to sit: Supervision, HOB elevated, Used rails Sit to supine: Supervision, HOB elevated   General bed mobility comments: Pt sat up on L side of bed with increased time. HOB elevated ~30deg. Use of bed rail to pull self up to EOB. Returning to bed pt swung BLE into bed and scooted up towards [facility] with use of bed rails and pushing through BLEs.    Transfers Overall transfer level: Needs assistance Equipment used: Rolling walker (2 wheels) Transfers: Sit to/from Stand, Bed to chair/wheelchair/BSC Sit to Stand: Supervision   Step pivot transfers: Supervision       General transfer comment: Pt stood from various surfaces including bed, commode, and recliner chair. Educated pt on proper sequencing, use of RW, and hand/foot placement. She powered up without physical assist. Supervision for safety. Good eccentric control. Pt transferred into bathroom and to/from recliner chair.    Ambulation/Gait Ambulation/Gait assistance: Supervision Gait Distance (Feet): 50 Feet (x2, standing rest break in between bouts) Assistive device: Rolling walker (2 wheels) Gait Pattern/deviations: Step-to pattern, Decreased step length - right, Decreased stance time - right, Decreased dorsiflexion - right, Antalgic, Trunk flexed Gait velocity: decreased Gait velocity interpretation: <1.31 ft/sec, indicative of household ambulator   General Gait Details: Pt ambulated with short slow steps. She maintained good proximity  to RW. Pt demonstrated limited R heel strike/push off,  fwd lean, and maintained RLE in ext the majority of the time. Cued pt to increase step length, improve upright posture, and increase hip/knee flex while advancing. She navigated room/hallway well without LOB.  Stairs Stairs: Yes Stairs assistance: Contact guard assist Stair Management: No rails, Forwards, Step to pattern Number of Stairs: 3 (short) General stair comments: Educated pt to ascend with LLE and descend with RLE. Provided pt with RUE HHA while ascending and she held onto PT's shoulders while descending. Discussed how her family should be positioned to support her in accordance with home set-up. Pt complete 3 short steps slowly without LOB.  Wheelchair Mobility     Tilt Bed    Modified Rankin (Stroke Patients Only)       Balance Overall balance assessment: Needs assistance Sitting-balance support: Feet supported, No upper extremity supported Sitting balance-Leahy Scale: Fair Sitting balance - Comments: Pt sat EOB with supervision. She attempted to don RLE sock, but was unable.   Standing balance support: Bilateral upper extremity supported, During functional activity, Reliant on assistive device for balance Standing balance-Leahy Scale: Poor Standing balance comment: Pt dependent on RW                             Pertinent Vitals/Pain Pain Assessment Pain Assessment: 0-10 Pain Score: 9  Pain Location: R Knee Pain Descriptors / Indicators: Discomfort, Aching, Sore Pain Intervention(s): Premedicated before session, Monitored during session, Limited activity within patient's tolerance, Repositioned    Home Living Family/patient expects to be discharged to:: Private residence Living Arrangements: Parent;Children (Mother and Son) Available Help at Discharge: Family;Available 24 hours/day Type of Home: House Home Access: Stairs to enter Entrance Stairs-Rails: None Entrance Stairs-Number of Steps: 1 (small)   Home Layout: One level Home Equipment:  Firefighter;Shower seat;Rollator (4 wheels);Grab bars - tub/shower;Hand held shower head      Prior Function Prior Level of Function : Independent/Modified Independent;Driving             Mobility Comments: Indep without AD. Denies fall history. ADLs Comments: Indep with ADLs/IADLs     Extremity/Trunk Assessment   Upper Extremity Assessment Upper Extremity Assessment: Overall WFL for tasks assessed;Right hand dominant    Lower Extremity Assessment Lower Extremity Assessment: RLE deficits/detail RLE Deficits / Details: Pt POD 1 s/p TKA. Decreased knee AROM and strength. Hip and Ankle AROM and strength WFL. RLE: Unable to fully assess due to pain RLE Coordination: decreased gross motor    Cervical / Trunk Assessment Cervical / Trunk Assessment: Other exceptions Cervical / Trunk Exceptions: Body Habitus  Communication   Communication Communication: No apparent difficulties    Cognition Arousal: Alert Behavior During Therapy: WFL for tasks assessed/performed   PT - Cognitive impairments: No apparent impairments                       PT - Cognition Comments: Pt A,Ox4 Following commands: Intact       Cueing Cueing Techniques: Verbal cues, Gestural cues     General Comments General comments (skin integrity, edema, etc.): Educated pt on R TKA HEP and provided pt with handout. Encouraged pt to complete CPM ~4hours/day. Discussed pain management strategies and use of ice machine.    Exercises Total Joint Exercises Quad Sets: Supine, Right, AROM, 5 reps Heel Slides: Supine, Right, AROM, 5 reps Straight Leg Raises: Supine, Right, AROM, 5 reps Long Arc  Quad: Seated, Right, AROM, 5 reps   Assessment/Plan    PT Assessment Patient needs continued PT services  PT Problem List Decreased strength;Decreased range of motion;Decreased activity tolerance;Decreased balance;Decreased mobility;Decreased knowledge of use of DME;Decreased safety awareness;Pain        PT Treatment Interventions DME instruction;Gait training;Stair training;Functional mobility training;Therapeutic activities;Therapeutic exercise;Balance training;Neuromuscular re-education;Patient/family education    PT Goals (Current goals can be found in the Care Plan section)  Acute Rehab PT Goals Patient Stated Goal: Return Home and regain independence PT Goal Formulation: With patient Time For Goal Achievement: 12/18/23 Potential to Achieve Goals: Good    Frequency 7X/week     Co-evaluation               AM-PAC PT 6 Clicks Mobility  Outcome Measure Help needed turning from your back to your side while in a flat bed without using bedrails?: A Little Help needed moving from lying on your back to sitting on the side of a flat bed without using bedrails?: A Little Help needed moving to and from a bed to a chair (including a wheelchair)?: A Little Help needed standing up from a chair using your arms (e.g., wheelchair or bedside chair)?: A Little Help needed to walk in hospital room?: A Little Help needed climbing 3-5 steps with a railing? : A Little 6 Click Score: 18    End of Session Equipment Utilized During Treatment: Gait belt Activity Tolerance: Patient tolerated treatment well Patient left: in bed;with call bell/phone within reach Nurse Communication: Mobility status;Other (comment) (pt onset of nausea and request for meds) PT Visit Diagnosis: Other abnormalities of gait and mobility (R26.89);Difficulty in walking, not elsewhere classified (R26.2)    Time: 9243-9165 PT Time Calculation (min) (ACUTE ONLY): 38 min   Charges:   PT Evaluation $PT Eval Moderate Complexity: 1 Mod PT Treatments $Gait Training: 8-22 mins PT General Charges $$ ACUTE PT VISIT: 1 Visit         Randall SAUNDERS, PT, DPT Acute Rehabilitation Services Office: [phone] Secure Chat Preferred  Delon CHRISTELLA Callander 12/04/2023, 8:50 AM

## 2023-12-04 NOTE — Progress Notes (Signed)
   12/04/23 0054  BiPAP/CPAP/SIPAP  $ Non-Invasive Ventilator  Non-Invasive Vent Set Up  BiPAP/CPAP/SIPAP Pt Type Adult  BiPAP/CPAP/SIPAP DREAMSTATIOND  Mask Type Nasal pillows  Dentures removed? Not applicable  EPAP 4 cmH2O  FiO2 (%) 21 %  Patient Home Machine No  Patient Home Mask Yes  Patient Home Tubing No  Auto Titrate No  CPAP/SIPAP surface wiped down Yes  Device Plugged into RED Power Outlet Yes

## 2023-12-04 NOTE — Anesthesia Postprocedure Evaluation (Signed)
 Anesthesia Post Note  Patient: [name] [name]  Procedure(s) Performed: ARTHROPLASTY, KNEE, TOTAL (Right: Knee)     Patient location during evaluation: PACU Anesthesia Type: Spinal and Regional Level of consciousness: awake and patient cooperative Pain management: pain level controlled Vital Signs Assessment: post-procedure vital signs reviewed and stable Respiratory status: spontaneous breathing and nonlabored ventilation Cardiovascular status: blood pressure returned to baseline Postop Assessment: spinal receding Anesthetic complications: no   No notable events documented.            Lauraine KATHEE [place]

## 2023-12-04 NOTE — Progress Notes (Signed)
  Subjective: Patient stable.  Pain controlled.  Did well with CPM machine last night.   Objective: Vital signs in last 24 hours: Temp:  [97.8 F (36.6 C)-98.4 F (36.9 C)] 97.8 F (36.6 C) (09/24 0458) Pulse Rate:  [71-96] 93 (09/24 0458) Resp:  [10-21] 19 (09/24 0458) BP: (97-155)/(75-104) 128/87 (09/24 0458) SpO2:  [91 %-97 %] 94 % (09/24 0458) FiO2 (%):  [21 %] 21 % (09/24 0054) Weight:  [126.6 kg] 126.6 kg (09/23 0903)  Intake/Output from previous day: 09/23 0701 - 09/24 0700 In: 1537 [P.O.:237; I.V.:1300] Out: 2300 [Urine:2200; Blood:100] Intake/Output this shift: Total I/O In: 237 [P.O.:237] Out: -   Exam:  Sensation intact distally Intact pulses distally Dorsiflexion/Plantar flexion intact  Labs: No results for input(s): HGB in the last 72 hours. No results for input(s): WBC, RBC, HCT, PLT in the last 72 hours. No results for input(s): NA, K, CL, CO2, BUN, CREATININE, GLUCOSE, CALCIUM in the last 72 hours. No results for input(s): LABPT, INR in the last 72 hours.  Assessment/Plan: Plan at this time is physical therapy this morning.  [name] able to do a straight leg raise.  Ace wrap removed.  If she does well with therapy which I anticipate she will we should be able to discharge her later in the early afternoon   G Glendia Hutchinson 12/04/2023, 6:56 AM

## 2023-12-06 DIAGNOSIS — F419 Anxiety disorder, unspecified: Secondary | ICD-10-CM | POA: Diagnosis not present

## 2023-12-06 DIAGNOSIS — I110 Hypertensive heart disease with heart failure: Secondary | ICD-10-CM | POA: Diagnosis not present

## 2023-12-06 DIAGNOSIS — D649 Anemia, unspecified: Secondary | ICD-10-CM | POA: Diagnosis not present

## 2023-12-06 DIAGNOSIS — G8929 Other chronic pain: Secondary | ICD-10-CM | POA: Diagnosis not present

## 2023-12-06 DIAGNOSIS — I509 Heart failure, unspecified: Secondary | ICD-10-CM | POA: Diagnosis not present

## 2023-12-06 DIAGNOSIS — N19 Unspecified kidney failure: Secondary | ICD-10-CM | POA: Diagnosis not present

## 2023-12-06 DIAGNOSIS — Z7951 Long term (current) use of inhaled steroids: Secondary | ICD-10-CM | POA: Diagnosis not present

## 2023-12-06 DIAGNOSIS — J310 Chronic rhinitis: Secondary | ICD-10-CM | POA: Diagnosis not present

## 2023-12-06 DIAGNOSIS — E039 Hypothyroidism, unspecified: Secondary | ICD-10-CM | POA: Diagnosis not present

## 2023-12-06 DIAGNOSIS — J449 Chronic obstructive pulmonary disease, unspecified: Secondary | ICD-10-CM | POA: Diagnosis not present

## 2023-12-06 DIAGNOSIS — I2510 Atherosclerotic heart disease of native coronary artery without angina pectoris: Secondary | ICD-10-CM | POA: Diagnosis not present

## 2023-12-06 DIAGNOSIS — K219 Gastro-esophageal reflux disease without esophagitis: Secondary | ICD-10-CM | POA: Diagnosis not present

## 2023-12-06 DIAGNOSIS — G4733 Obstructive sleep apnea (adult) (pediatric): Secondary | ICD-10-CM | POA: Diagnosis not present

## 2023-12-06 DIAGNOSIS — G43909 Migraine, unspecified, not intractable, without status migrainosus: Secondary | ICD-10-CM | POA: Diagnosis not present

## 2023-12-06 DIAGNOSIS — R32 Unspecified urinary incontinence: Secondary | ICD-10-CM | POA: Diagnosis not present

## 2023-12-06 DIAGNOSIS — I499 Cardiac arrhythmia, unspecified: Secondary | ICD-10-CM | POA: Diagnosis not present

## 2023-12-06 DIAGNOSIS — E119 Type 2 diabetes mellitus without complications: Secondary | ICD-10-CM | POA: Diagnosis not present

## 2023-12-06 DIAGNOSIS — Z6841 Body Mass Index (BMI) 40.0 and over, adult: Secondary | ICD-10-CM | POA: Diagnosis not present

## 2023-12-06 DIAGNOSIS — Z7982 Long term (current) use of aspirin: Secondary | ICD-10-CM | POA: Diagnosis not present

## 2023-12-06 DIAGNOSIS — K769 Liver disease, unspecified: Secondary | ICD-10-CM | POA: Diagnosis not present

## 2023-12-06 DIAGNOSIS — F410 Panic disorder [episodic paroxysmal anxiety] without agoraphobia: Secondary | ICD-10-CM | POA: Diagnosis not present

## 2023-12-08 DIAGNOSIS — M1711 Unilateral primary osteoarthritis, right knee: Secondary | ICD-10-CM

## 2023-12-09 ENCOUNTER — Telehealth: Payer: Self-pay | Admitting: Orthopedic Surgery

## 2023-12-09 ENCOUNTER — Other Ambulatory Visit: Payer: Self-pay | Admitting: Orthopedic Surgery

## 2023-12-09 ENCOUNTER — Ambulatory Visit: Payer: Self-pay | Admitting: Nurse Practitioner

## 2023-12-09 MED ORDER — OXYCODONE HCL 5 MG PO CAPS: 5.0000 mg | ORAL_CAPSULE | ORAL | 0 refills | Status: DC | PRN

## 2023-12-09 NOTE — Telephone Encounter (Signed)
 Called and gave verbal ok

## 2023-12-09 NOTE — Telephone Encounter (Signed)
 Ok to give verbal ok?

## 2023-12-09 NOTE — Telephone Encounter (Signed)
 Pt called stating that she doesn't have strength in hands. Also she needs refill on pain meds because she is going to run out before her next visit. Pt call back number is [phone] Pharmacy is [facility] number is [phone].

## 2023-12-09 NOTE — Telephone Encounter (Signed)
 Maria from [facility] called for verbal orders. PT home health, 1 wk 1, and  3 wk 2. CB#[phone]

## 2023-12-09 NOTE — Telephone Encounter (Signed)
 Meds sent

## 2023-12-09 NOTE — Telephone Encounter (Signed)
 ok

## 2023-12-10 DIAGNOSIS — I110 Hypertensive heart disease with heart failure: Secondary | ICD-10-CM | POA: Diagnosis not present

## 2023-12-10 DIAGNOSIS — F410 Panic disorder [episodic paroxysmal anxiety] without agoraphobia: Secondary | ICD-10-CM | POA: Diagnosis not present

## 2023-12-10 DIAGNOSIS — Z7982 Long term (current) use of aspirin: Secondary | ICD-10-CM | POA: Diagnosis not present

## 2023-12-10 DIAGNOSIS — R32 Unspecified urinary incontinence: Secondary | ICD-10-CM | POA: Diagnosis not present

## 2023-12-10 DIAGNOSIS — Z6841 Body Mass Index (BMI) 40.0 and over, adult: Secondary | ICD-10-CM | POA: Diagnosis not present

## 2023-12-10 DIAGNOSIS — N19 Unspecified kidney failure: Secondary | ICD-10-CM | POA: Diagnosis not present

## 2023-12-10 DIAGNOSIS — I499 Cardiac arrhythmia, unspecified: Secondary | ICD-10-CM | POA: Diagnosis not present

## 2023-12-10 DIAGNOSIS — Z7951 Long term (current) use of inhaled steroids: Secondary | ICD-10-CM | POA: Diagnosis not present

## 2023-12-10 DIAGNOSIS — K219 Gastro-esophageal reflux disease without esophagitis: Secondary | ICD-10-CM | POA: Diagnosis not present

## 2023-12-10 DIAGNOSIS — J310 Chronic rhinitis: Secondary | ICD-10-CM | POA: Diagnosis not present

## 2023-12-10 DIAGNOSIS — E785 Hyperlipidemia, unspecified: Secondary | ICD-10-CM | POA: Diagnosis not present

## 2023-12-10 DIAGNOSIS — Z471 Aftercare following joint replacement surgery: Secondary | ICD-10-CM | POA: Diagnosis not present

## 2023-12-10 DIAGNOSIS — E039 Hypothyroidism, unspecified: Secondary | ICD-10-CM | POA: Diagnosis not present

## 2023-12-10 DIAGNOSIS — E119 Type 2 diabetes mellitus without complications: Secondary | ICD-10-CM | POA: Diagnosis not present

## 2023-12-10 DIAGNOSIS — J449 Chronic obstructive pulmonary disease, unspecified: Secondary | ICD-10-CM | POA: Diagnosis not present

## 2023-12-10 DIAGNOSIS — D649 Anemia, unspecified: Secondary | ICD-10-CM | POA: Diagnosis not present

## 2023-12-10 DIAGNOSIS — G8929 Other chronic pain: Secondary | ICD-10-CM | POA: Diagnosis not present

## 2023-12-10 DIAGNOSIS — I2510 Atherosclerotic heart disease of native coronary artery without angina pectoris: Secondary | ICD-10-CM | POA: Diagnosis not present

## 2023-12-10 DIAGNOSIS — I509 Heart failure, unspecified: Secondary | ICD-10-CM | POA: Diagnosis not present

## 2023-12-10 DIAGNOSIS — G43909 Migraine, unspecified, not intractable, without status migrainosus: Secondary | ICD-10-CM | POA: Diagnosis not present

## 2023-12-10 DIAGNOSIS — G4733 Obstructive sleep apnea (adult) (pediatric): Secondary | ICD-10-CM | POA: Diagnosis not present

## 2023-12-10 DIAGNOSIS — F419 Anxiety disorder, unspecified: Secondary | ICD-10-CM | POA: Diagnosis not present

## 2023-12-10 DIAGNOSIS — K769 Liver disease, unspecified: Secondary | ICD-10-CM | POA: Diagnosis not present

## 2023-12-16 ENCOUNTER — Other Ambulatory Visit: Payer: Self-pay | Admitting: Surgical

## 2023-12-16 ENCOUNTER — Telehealth: Payer: Self-pay | Admitting: Orthopedic Surgery

## 2023-12-16 MED ORDER — OXYCODONE HCL 5 MG PO CAPS: 5.0000 mg | ORAL_CAPSULE | ORAL | 0 refills | Status: DC | PRN

## 2023-12-16 NOTE — Telephone Encounter (Signed)
Patient called. She would like a refill on oxycodone.

## 2023-12-16 NOTE — Telephone Encounter (Signed)
 Refilled oxycodone .  She should be good to shower but no submersion (bath, pool, tub, etc.).  Should still have waterproof dressing on from time of surgery since her first postop visit is not until Wednesday

## 2023-12-16 NOTE — Telephone Encounter (Signed)
 Double message. See other message

## 2023-12-16 NOTE — Telephone Encounter (Signed)
 Patient called. Would like to know if she could shower? Also the bandage is coming off.

## 2023-12-16 NOTE — Telephone Encounter (Signed)
 Called pt. She stated her bandage is half off. I advised just to tape down and keep incision dry and clean until wed. She stated understanding

## 2023-12-17 ENCOUNTER — Telehealth: Payer: Self-pay | Admitting: Orthopedic Surgery

## 2023-12-17 NOTE — Telephone Encounter (Signed)
 IC advised no current outpatient therapy scheduled at this time but may be initiated after first post op visit.

## 2023-12-17 NOTE — Telephone Encounter (Signed)
 Lorie (PT) from [facility] called asking for clarification if pt has referral for outpatient physical therapy. Lorie secure number is [phone].

## 2023-12-18 ENCOUNTER — Encounter: Payer: Self-pay | Admitting: Orthopedic Surgery

## 2023-12-18 ENCOUNTER — Ambulatory Visit: Admitting: Orthopedic Surgery

## 2023-12-18 ENCOUNTER — Other Ambulatory Visit (INDEPENDENT_AMBULATORY_CARE_PROVIDER_SITE_OTHER): Payer: Self-pay

## 2023-12-18 DIAGNOSIS — Z96651 Presence of right artificial knee joint: Secondary | ICD-10-CM

## 2023-12-18 NOTE — Progress Notes (Signed)
 Post-Op Visit Note   Patient: [name] [name]           Date of Birth: 10/27/1970           MRN: [id number] Visit Date: 12/18/2023 PCP: Gladis Mustard, FNP   Assessment & Plan:  Chief Complaint:  Chief Complaint  Patient presents with   Right Knee - Routine Post Op    /RIGHT TKA (surgery date 12-03-23)   Visit Diagnoses:  1. Status post total right knee replacement     Plan: [name] is a patient who underwent right total knee replacement 2 weeks ago.  Ambulating with a walker.  Taking oxycodone  for pain.  On exam no calf tenderness negative Homans.  Lacks about 5 degrees of full extension but comes out some 90 of flexion.  Plan at this time is to start outpatient physical therapy 3 times a week for 4 weeks to really work on range of motion.  She also reports some decreased grip strength in both hands since surgery.  Negative Tinel's in the ulnar nerve cubital tunnels bilaterally.  I think this is something we can watch.  Hard to know exactly what that is from.  She has only slightly decreased grip strength and finger interosseous strength that 5- out of 5.  Follow-Up Instructions: Return in about 4 weeks (around 01/15/2024).   Orders:  Orders Placed This Encounter  Procedures   XR Knee 1-2 Views Right   Ambulatory referral to Physical Therapy   No orders of the defined types were placed in this encounter.   Imaging: No results found.  PMFS History: Patient Active Problem List   Diagnosis Date Noted   Arthritis of right knee 12/08/2023   S/P total knee replacement, right 12/03/2023   Chronic rhinitis 02/01/2020   S/P right rotator cuff repair 04/16/2019   Gastroesophageal reflux disease without esophagitis 07/21/2018   Hidradenitis suppurativa 10/01/2016   OSA (obstructive sleep apnea) 05/13/2015   Morbid obesity (HCC) 05/13/2015   Hypothyroidism 03/27/2014   Panic attacks 03/27/2014   SVT (supraventricular tachycardia) 06/26/2012   Migraines 06/26/2012   Past  Medical History:  Diagnosis Date   Allergy    SEASONAL   Anxiety    Chronic headaches    Colon polyps    GERD (gastroesophageal reflux disease)    History of kidney stones    Hyperlipidemia    Hypertension    Hypothyroidism    Palpitations    Sleep apnea     Family History  Problem Relation Age of Onset   Hypertension Mother    Lung cancer Father        died from   Heart disease Maternal Grandmother    Heart disease Maternal Grandfather    Breast cancer Paternal Grandmother        paternal aunts x2   Colon cancer Neg Hx    Esophageal cancer Neg Hx    Rectal cancer Neg Hx    Stomach cancer Neg Hx    Colon polyps Neg Hx     Past Surgical History:  Procedure Laterality Date   CESAREAN SECTION     1194   COLONOSCOPY     KNEE ARTHROSCOPY     LITHOTRIPSY  12/2014   ROTATOR CUFF REPAIR Right 2021   TOTAL KNEE ARTHROPLASTY Right 12/03/2023   Procedure: ARTHROPLASTY, KNEE, TOTAL;  Surgeon: Addie Cordella Hamilton, MD;  Location: [facility] OR;  Service: Orthopedics;  Laterality: Right;   Social History   Occupational History    Employer:  [employer]  Tobacco Use   Smoking status: Former    Current packs/day: 0.00    Average packs/day: 0.5 packs/day for 10.1 years (5.0 ttl pk-yrs)    Types: Cigarettes    Start date: 03/12/2008    Quit date: 04/05/2018    Years since quitting: 5.7   Smokeless tobacco: Never  Vaping Use   Vaping status: Never Used  Substance and Sexual Activity   Alcohol use: Yes    Alcohol/week: 0.0 standard drinks of alcohol    Comment: rare   Drug use: No   Sexual activity: Not Currently

## 2023-12-19 ENCOUNTER — Telehealth: Payer: Self-pay

## 2023-12-19 NOTE — Telephone Encounter (Signed)
 PT called stating pt is skipping today's appointment due to pain

## 2023-12-23 ENCOUNTER — Other Ambulatory Visit: Payer: Self-pay | Admitting: Orthopedic Surgery

## 2023-12-25 ENCOUNTER — Ambulatory Visit: Attending: Orthopedic Surgery | Admitting: Physical Therapy

## 2023-12-25 ENCOUNTER — Encounter: Payer: Self-pay | Admitting: Physical Therapy

## 2023-12-25 DIAGNOSIS — R600 Localized edema: Secondary | ICD-10-CM | POA: Diagnosis present

## 2023-12-25 DIAGNOSIS — M79604 Pain in right leg: Secondary | ICD-10-CM | POA: Diagnosis present

## 2023-12-25 DIAGNOSIS — R2689 Other abnormalities of gait and mobility: Secondary | ICD-10-CM | POA: Insufficient documentation

## 2023-12-25 DIAGNOSIS — M6281 Muscle weakness (generalized): Secondary | ICD-10-CM | POA: Insufficient documentation

## 2023-12-25 DIAGNOSIS — Z96651 Presence of right artificial knee joint: Secondary | ICD-10-CM | POA: Diagnosis not present

## 2023-12-25 NOTE — Therapy (Signed)
 OUTPATIENT PHYSICAL THERAPY EVALUATION   Patient Name: [name] [name] MRN: [id number] DOB:1971-01-13, 53 y.o., female Today's Date: 12/25/2023  END OF SESSION:  PT End of Session - 12/25/23 1651     Visit Number 1    Number of Visits 15    Date for Recertification  02/19/24    PT Start Time 1505    PT Stop Time 1550    PT Time Calculation (min) 45 min    Activity Tolerance Patient tolerated treatment well    Behavior During Therapy [facility] for tasks assessed/performed          Past Medical History:  Diagnosis Date   Allergy    SEASONAL   Anxiety    Chronic headaches    Colon polyps    GERD (gastroesophageal reflux disease)    History of kidney stones    Hyperlipidemia    Hypertension    Hypothyroidism    Palpitations    Sleep apnea    Past Surgical History:  Procedure Laterality Date   CESAREAN SECTION     1194   COLONOSCOPY     KNEE ARTHROSCOPY     LITHOTRIPSY  12/2014   ROTATOR CUFF REPAIR Right 2021   TOTAL KNEE ARTHROPLASTY Right 12/03/2023   Procedure: ARTHROPLASTY, KNEE, TOTAL;  Surgeon: Addie Cordella Hamilton, MD;  Location: [facility] OR;  Service: Orthopedics;  Laterality: Right;   Patient Active Problem List   Diagnosis Date Noted   Arthritis of right knee 12/08/2023   S/P total knee replacement, right 12/03/2023   Chronic rhinitis 02/01/2020   S/P right rotator cuff repair 04/16/2019   Gastroesophageal reflux disease without esophagitis 07/21/2018   Hidradenitis suppurativa 10/01/2016   OSA (obstructive sleep apnea) 05/13/2015   Morbid obesity (HCC) 05/13/2015   Hypothyroidism 03/27/2014   Panic attacks 03/27/2014   SVT (supraventricular tachycardia) 06/26/2012   Migraines 06/26/2012    PCP: Gladis Mustard, FNP   REFERRING PROVIDER: Addie Cordella Hamilton, MD   REFERRING DIAG:  Diagnosis  [phone] (ICD-10-CM) - Status post total right knee replacement    Rationale for Evaluation and Treatment:  Rehabiliation  THERAPY DIAG:  Pain in right  leg  Muscle weakness (generalized)  Other abnormalities of gait and mobility  Localized edema  ONSET DATE: Rt TKA 12/03/23   SUBJECTIVE:                                                                                                                                                                                           SUBJECTIVE STATEMENT: She had her knee replaced, states she is supposed to get set up to see Herlene in 4 weeks.  She finished home health PT this past Friday. [name] getting some numbness in her hands since surgery, she does use RW still but did not use AD prior.   PERTINENT HISTORY:  See above PMH  PAIN:  NPRS scale: 5/10 upon arrival Pain location:Right knee in front Pain description: constant, achy, sharp Aggravating factors: lifting leg up, bending, extending Relieving factors: ice, meds   PRECAUTIONS: ,  None  RED FLAGS: None   WEIGHT BEARING RESTRICTIONS:  No  FALLS:  Has patient fallen in last 6 months? No   OCCUPATION:  None, in between jobs  PLOF:  Independent  PATIENT GOALS:  Reduce   OBJECTIVE:  Note: Objective measures were completed at Evaluation unless otherwise noted.   PATIENT SURVEYS:  Patient-Specific Activity Scoring Scheme  0 represents "unable to perform." 10 represents "able to perform at prior level. 0 1 2 3 4 5 6 7 8 9  10 (Date and Score)   Activity Eval     1. Walking unassisted 3     2. Sitting in chair to eat 1    3.     4.    5.    Score 2/10    Total score = sum of the activity scores/number of activities Minimum detectable change (90%CI) for average score = 2 points Minimum detectable change (90%CI) for single activity score = 3 points     EDEMA:  Yes: moderate in right knee   GAIT: Assistive device utilized: Walker - 2 wheeled Level of assistance: Modified independence Comments: does not use AD PLOF    LOWER EXTREMITY ROM:     Active /PROM Right eval   Hip flexion    Hip  extension    Hip abduction    Hip adduction    Hip internal rotation    Hip external rotation    Knee flexion 95/100   Knee extension 5/4   Ankle dorsiflexion    Ankle plantarflexion    Ankle inversion    Ankle eversion     (Blank rows = not tested)   LOWER EXTREMITY MMT:    MMT Right eval Left eval  Hip flexion 4   Hip extension    Hip abduction 4   Hip adduction    Hip internal rotation    Hip external rotation    Knee flexion 4   Knee extension 4   Ankle dorsiflexion    Ankle plantarflexion    Ankle inversion    Ankle eversion     (Blank rows = not tested)    FUNCTIONAL TESTS:  12/25/23 5 times sit to stand: 23 seconds without UE support                                                                                                                              TREATMENT DATE:  Eval HEP creation and review with demonstration and trial set preformed, see below for details Manual:Rt knee PROM with  overpressure to tolerance into flexion    PATIENT EDUCATION: Education details: HEP, PT plan of care, selfcare Person educated: Patient Education method: Explanation, Demonstration, Verbal cues, and Handouts Education comprehension: verbalized understanding, further education recommended   HOME EXERCISE PROGRAM: Access Code: [email or web address] URL: [email or web address] Date: 12/25/2023 Prepared by: Redell Moose  Exercises - Supine Heel Slide with Strap  - 2-3 x daily - 6 x weekly - 1 sets - 10 reps - 5 hold - Quad Setting and Stretching  - 2-3 x daily - 6 x weekly - 1 sets - 10 reps - 5 sec hold - seated knee flexion stretch with knee extension strengthening  - 2-3 x daily - 6 x weekly - 1 sets - 10 reps - 5 sec hold - Seated Straight Leg Raise with Quad Contraction  - 2-3 x daily - 6 x weekly - 2 sets - 10 reps - Sit to Stand Without Arm Support  - 2 x daily - 6 x weekly - 2 sets - 5 reps - Standing March with Counter Support  - 2 x daily - 6 x weekly  - 1 sets - 10 reps  ASSESSMENT:  CLINICAL IMPRESSION: Patient referred to PT S/P Rt TKA 12/03/23.Doing well up to this point but patient will benefit from skilled PT to improve overall function and to address impairments and limitations listed below.  OBJECTIVE IMPAIRMENTS: decreased activity tolerance for ADL's, difficulty walking, decreased balance, decreased endurance, decreased mobility, decreased ROM, decreased strength, impaired flexibility, impaired LE use, and pain.  ACTIVITY LIMITATIONS: bending, liftting, walking, standing, cleaning, community activity, driving,  PERSONAL FACTORS: see above PMH  also affecting patient's functional outcome.  REHAB POTENTIAL: Good  CLINICAL DECISION MAKING: Stable/uncomplicated  EVALUATION COMPLEXITY: Low    GOALS: Short term PT Goals Target date: 01/22/2024    Pt will be I and compliant with HEP. Baseline:  Goal status: New Pt will decrease pain by 25% overall Baseline: Goal status: New  Long term PT goals Target date:02/19/2024   Pt will improve right knee ROM to [facility] (3-115) to improve functional mobility Baseline: Goal status: New Pt will improve right knee strength to at least 5/5 MMT to improve functional strength Baseline: Goal status: New Pt will improve Patient specific functional scale (PSFS) to at least7 /10 to show improved function level Baseline: Goal status: New Pt will reduce pain to overall less than 3/10 with usual activity and walking without assistance Baseline: Goal status: New Pt will improve 5 times sit to stand test to less than 13 seconds to show improved leg strength and balance Baseline: Goal status: New  PLAN: PT FREQUENCY: 2-3 times per week   PT DURATION: 6-8 weeks  PLANNED INTERVENTIONS  97110-Therapeutic exercises, 97530- Therapeutic activity, 97112- Neuromuscular re-education, 97535- Self Care, 02859- Manual therapy, 97116- Gait training, and 97016- Vasopneumatic device  PLAN FOR NEXT  SESSION: knee ROM and early strength focus as tolerated, weaning off walker as able NEXT MD VISIT: not scheduled at eval.  Redell JONELLE Moose, PT,DPT 12/25/2023, 4:53 PM

## 2023-12-29 NOTE — Discharge Summary (Signed)
 Physician Discharge Summary      Patient ID: [name] [name] MRN: [id number] DOB/AGE: [birth year] 52 y.o.  Admit date: 12/03/2023 Discharge date: 12/04/2023  Admission Diagnoses:  Principal Problem:   S/P total knee replacement, right Active Problems:   Arthritis of right knee   Discharge Diagnoses:  Same  Surgeries: Procedure(s): ARTHROPLASTY, KNEE, TOTAL on 12/03/2023   Consultants:   Discharged Condition: Stable  Hospital Course: [name] [name] is an 53 y.o. female who was admitted 12/03/2023 with a chief complaint of right knee pain, and found to have a diagnosis of right knee arthritis.  They were brought to the operating room on 12/03/2023 and underwent the above named procedures.  Pt awoke from anesthesia without complication and was transferred to the floor. On POD1, patient mobilized well with physical therapy.  No red flag signs or symptoms throughout her stay.  Discharged home on POD 1..  Pt will f/u with Dr. Addie in clinic in ~2 weeks.   Antibiotics given:  Anti-infectives (From admission, onward)    Start     Dose/Rate Route Frequency Ordered Stop   12/03/23 2000  ceFAZolin  (ANCEF ) IVPB 2g/100 mL premix        2 g 200 mL/hr over 30 Minutes Intravenous Every 8 hours 12/03/23 1700 12/04/23 1153   12/03/23 1351  vancomycin  (VANCOCIN ) powder  Status:  Discontinued          As needed 12/03/23 1351 12/03/23 1530   12/03/23 0915  ceFAZolin  (ANCEF ) IVPB 3g/150 mL premix        3 g 300 mL/hr over 30 Minutes Intravenous On call to O.R. 12/03/23 0908 12/03/23 1245     .  Recent vital signs:  Vitals:   12/04/23 0458 12/04/23 0900  BP: 128/87 131/79  Pulse: 93 77  Resp: 19 18  Temp: 97.8 F (36.6 C) 98 F (36.7 C)  SpO2: 94% 93%    Recent laboratory studies:  Results for orders placed or performed during the hospital encounter of 11/28/23  CBC   Collection Time: 11/28/23  2:00 PM  Result Value Ref Range   WBC 10.9 (H) 4.0 - 10.5 K/uL   RBC 5.15 (H) 3.87 - 5.11  MIL/uL   Hemoglobin 15.2 (H) 12.0 - 15.0 g/dL   HCT 54.0 63.9 - 53.9 %   MCV 89.1 80.0 - 100.0 fL   MCH 29.5 26.0 - 34.0 pg   MCHC 33.1 30.0 - 36.0 g/dL   RDW 86.2 88.4 - 84.4 %   Platelets 281 150 - 400 K/uL   nRBC 0.0 0.0 - 0.2 %  Basic metabolic panel   Collection Time: 11/28/23  2:00 PM  Result Value Ref Range   Sodium 142 135 - 145 mmol/L   Potassium 3.8 3.5 - 5.1 mmol/L   Chloride 107 98 - 111 mmol/L   CO2 23 22 - 32 mmol/L   Glucose, Bld 106 (H) 70 - 99 mg/dL   BUN 11 6 - 20 mg/dL   Creatinine, Ser 9.08 0.44 - 1.00 mg/dL   Calcium 9.0 8.9 - 89.6 mg/dL   GFR, Estimated >39 >39 mL/min   Anion gap 12 5 - 15  Urinalysis, w/ Reflex to Culture (Infection Suspected) -Urine, Clean Catch   Collection Time: 11/28/23  2:10 PM  Result Value Ref Range   Specimen Source URINE, CLEAN CATCH    Color, Urine YELLOW YELLOW   APPearance HAZY (A) CLEAR   Specific Gravity, Urine 1.011 1.005 - 1.030   pH 6.0 5.0 -  8.0   Glucose, UA NEGATIVE NEGATIVE mg/dL   Hgb urine dipstick NEGATIVE NEGATIVE   Bilirubin Urine NEGATIVE NEGATIVE   Ketones, ur NEGATIVE NEGATIVE mg/dL   Protein, ur NEGATIVE NEGATIVE mg/dL   Nitrite NEGATIVE NEGATIVE   Leukocytes,Ua NEGATIVE NEGATIVE   RBC / HPF 0-5 0 - 5 RBC/hpf   WBC, UA 0-5 0 - 5 WBC/hpf   Bacteria, UA RARE (A) NONE SEEN   Squamous Epithelial / HPF 0-5 0 - 5 /HPF  Surgical pcr screen   Collection Time: 11/28/23  2:38 PM   Specimen: Nasal Mucosa; Nasal Swab  Result Value Ref Range   MRSA, PCR NEGATIVE NEGATIVE   Staphylococcus aureus NEGATIVE NEGATIVE    Discharge Medications:   Allergies as of 12/04/2023       Reactions   Chlorpheniramine Palpitations   insomnia   Pseudoephedrine  Hcl Palpitations, Other (See Comments)   insomnia        Medication List     STOP taking these medications    FISH OIL PO       TAKE these medications    acetaminophen  325 MG tablet Commonly known as: TYLENOL  Take 1-2 tablets (325-650 mg total) by  mouth every 6 (six) hours as needed for mild pain (pain score 1-3) or fever (or temp > 100.5).   azelastine  0.1 % nasal spray Commonly known as: ASTELIN  Place 2 sprays into both nostrils 2 (two) times daily. Use in each nostril as directed What changed:  when to take this reasons to take this   clonazePAM  0.5 MG tablet Commonly known as: KLONOPIN  Take 1 tablet (0.5 mg total) by mouth 2 (two) times daily as needed. What changed:  when to take this additional instructions   cyclobenzaprine  10 MG tablet Commonly known as: FLEXERIL  TAKE 2 TABLETS BY MOUTH AT BEDTIME What changed: how much to take   docusate sodium  100 MG capsule Commonly known as: COLACE Take 1 capsule (100 mg total) by mouth 2 (two) times daily.   fexofenadine 180 MG tablet Commonly known as: ALLEGRA Take 180 mg by mouth every evening.   levothyroxine  75 MCG tablet Commonly known as: SYNTHROID  Take 1 tablet (75 mcg total) by mouth daily.   liothyronine  5 MCG tablet Commonly known as: CYTOMEL  TAKE 1 TABLET BY MOUTH EVERY DAY   Magnesium  Oxide 420 MG Tabs Take 420 mg by mouth at bedtime.   metoCLOPramide  10 MG tablet Commonly known as: REGLAN  Take 1 tablet (10 mg total) by mouth daily. What changed: when to take this   mometasone  50 MCG/ACT nasal spray Commonly known as: NASONEX  INSTILL 2 SPRAYS IN EACH NOSTRIL EVERY DAY What changed: See the new instructions.   naproxen  500 MG tablet Commonly known as: NAPROSYN  TAKE 1 TABLET BY MOUTH TWICE DAILY WITH A MEAL What changed:  when to take this reasons to take this   oxybutynin  5 MG 24 hr tablet Commonly known as: DITROPAN -XL Take 1 tablet (5 mg total) by mouth at bedtime. What changed: when to take this   pantoprazole  40 MG tablet Commonly known as: PROTONIX  Take 1 tablet (40 mg total) by mouth 2 (two) times daily.   progesterone 100 MG capsule Commonly known as: PROMETRIUM TAKE TWO CAPSULES BY MOUTH AT BEDTIME   propranolol  80 MG  tablet Commonly known as: INDERAL  Take 1 tablet (80 mg total) by mouth daily.        Diagnostic Studies: XR Knee 1-2 Views Right Result Date: 12/18/2023 AP lateral radiographs right knee reviewed.  Total knee prosthesis in good position alignment with no complicating features.   Disposition: Discharge disposition: 01-Home or Self Care       Discharge Instructions     Call MD / Call 911   Complete by: As directed    If you experience chest pain or shortness of breath, CALL 911 and be transported to the hospital emergency room.  If you develope a fever above 101 F, pus (white drainage) or increased drainage or redness at the wound, or calf pain, call your surgeon's office.   Constipation Prevention   Complete by: As directed    Drink plenty of fluids.  Prune juice may be helpful.  You may use a stool softener, such as Colace (over the counter) 100 mg twice a day.  Use MiraLax (over the counter) for constipation as needed.   Diet - low sodium heart healthy   Complete by: As directed    Discharge instructions   Complete by: As directed    From Dr. Addie and Herlene  1.  Weightbearing as tolerated with walker 2.  CPM is seeing 1 hour 3 times a day increasing the degrees daily 3.  Okay to shower dressing is waterproof 4.  Use the knee immobilizer when walking until you are able to do 15 straight leg raises on your own.  At that point you do not need the knee immobilizer on when you are walking.  No need to keep it on when you are laying around.  Per [facility] clinic policy, our goal is ensure optimal postoperative pain control with a multimodal pain management strategy. For all [facility] patients, our goal is to wean post-operative narcotic medications by 6 weeks post-operatively. If this is not possible due to utilization of pain medication prior to surgery, your [facility] doctor will support your acute post-operative pain control for the first 6 weeks postoperatively, with a plan to  transition you back to your primary pain team following that. [name] [name] will work to ensure a Therapist, occupational.   Increase activity slowly as tolerated   Complete by: As directed    Post-operative opioid taper instructions:   Complete by: As directed    POST-OPERATIVE OPIOID TAPER INSTRUCTIONS: It is important to wean off of your opioid medication as soon as possible. If you do not need pain medication after your surgery it is ok to stop day one. Opioids include: Codeine, Hydrocodone (Norco, Vicodin), Oxycodone (Percocet, oxycontin ) and hydromorphone  amongst others.  Long term and even short term use of opiods can cause: Increased pain response Dependence Constipation Depression Respiratory depression And more.  Withdrawal symptoms can include Flu like symptoms Nausea, vomiting And more Techniques to manage these symptoms Hydrate well Eat regular healthy meals Stay active Use relaxation techniques(deep breathing, meditating, yoga) Do Not substitute Alcohol to help with tapering If you have been on opioids for less than two weeks and do not have pain than it is ok to stop all together.  Plan to wean off of opioids This plan should start within one week post op of your joint replacement. Maintain the same interval or time between taking each dose and first decrease the dose.  Cut the total daily intake of opioids by one tablet each day Next start to increase the time between doses. The last dose that should be eliminated is the evening dose.           Follow-up Information     Gladis Mustard, FNP Follow up.   Specialty: Family Medicine Contact information: [phone]  [address] [place] [address] [phone]         [facility] Follow up.   Specialty: Home Health Services Why: home health services will be provided by [facility] information: [address] [place] [address] [phone]                   Signed: Herlene Calix 12/29/2023, 11:20 AM

## 2024-01-01 ENCOUNTER — Ambulatory Visit: Admitting: Physical Therapy

## 2024-01-01 ENCOUNTER — Encounter: Payer: Self-pay | Admitting: Physical Therapy

## 2024-01-01 DIAGNOSIS — R2689 Other abnormalities of gait and mobility: Secondary | ICD-10-CM

## 2024-01-01 DIAGNOSIS — R600 Localized edema: Secondary | ICD-10-CM

## 2024-01-01 DIAGNOSIS — M79604 Pain in right leg: Secondary | ICD-10-CM | POA: Diagnosis not present

## 2024-01-01 DIAGNOSIS — M6281 Muscle weakness (generalized): Secondary | ICD-10-CM

## 2024-01-01 NOTE — Therapy (Signed)
 OUTPATIENT PHYSICAL THERAPY TREATMENT   Patient Name: [name] [name] MRN: [id number] DOB:[birth year], 53 y.o., female Today's Date: 01/01/2024  END OF SESSION:  PT End of Session - 01/01/24 0837     Visit Number 2    Number of Visits 15    Date for Recertification  02/19/24    PT Start Time 0837    PT Stop Time 0930    PT Time Calculation (min) 53 min    Activity Tolerance Patient tolerated treatment well    Behavior During Therapy [facility] for tasks assessed/performed          Past Medical History:  Diagnosis Date   Allergy    SEASONAL   Anxiety    Chronic headaches    Colon polyps    GERD (gastroesophageal reflux disease)    History of kidney stones    Hyperlipidemia    Hypertension    Hypothyroidism    Palpitations    Sleep apnea    Past Surgical History:  Procedure Laterality Date   CESAREAN SECTION     1194   COLONOSCOPY     KNEE ARTHROSCOPY     LITHOTRIPSY  12/2014   ROTATOR CUFF REPAIR Right 2021   TOTAL KNEE ARTHROPLASTY Right 12/03/2023   Procedure: ARTHROPLASTY, KNEE, TOTAL;  Surgeon: Addie Cordella Hamilton, MD;  Location: [facility] OR;  Service: Orthopedics;  Laterality: Right;   Patient Active Problem List   Diagnosis Date Noted   Arthritis of right knee 12/08/2023   S/P total knee replacement, right 12/03/2023   Chronic rhinitis 02/01/2020   S/P right rotator cuff repair 04/16/2019   Gastroesophageal reflux disease without esophagitis 07/21/2018   Hidradenitis suppurativa 10/01/2016   OSA (obstructive sleep apnea) 05/13/2015   Morbid obesity (HCC) 05/13/2015   Hypothyroidism 03/27/2014   Panic attacks 03/27/2014   SVT (supraventricular tachycardia) 06/26/2012   Migraines 06/26/2012    PCP: Gladis Mustard, FNP   REFERRING PROVIDER: Gladis, Mary-Margaret, *   REFERRING DIAG:  Diagnosis  [phone] (ICD-10-CM) - Status post total right knee replacement    Rationale for Evaluation and Treatment:  Rehabiliation  THERAPY DIAG:  Pain in right  leg  Muscle weakness (generalized)  Other abnormalities of gait and mobility  Localized edema  ONSET DATE: Rt TKA 12/03/23   SUBJECTIVE:                                                                                                                                                                                           SUBJECTIVE STATEMENT: Sleep has been difficult. She has been not using walker for home, using cane, N/T In hands a tad bit  better.    PERTINENT HISTORY:  See above PMH  PAIN:  NPRS scale: 5/10 upon arrival Pain location:Right knee in front Pain description: constant, achy, sharp Aggravating factors: lifting leg up, bending, extending Relieving factors: ice, meds   PRECAUTIONS: ,  None  RED FLAGS: None   WEIGHT BEARING RESTRICTIONS:  No  FALLS:  Has patient fallen in last 6 months? No   OCCUPATION:  None, in between jobs  PLOF:  Independent  PATIENT GOALS:  Reduce   OBJECTIVE:  Note: Objective measures were completed at Evaluation unless otherwise noted.   PATIENT SURVEYS:  Patient-Specific Activity Scoring Scheme  0 represents "unable to perform." 10 represents "able to perform at prior level. 0 1 2 3 4 5 6 7 8 9  10 (Date and Score)   Activity Eval     1. Walking unassisted 3     2. Sitting in chair to eat 1    3.     4.    5.    Score 2/10    Total score = sum of the activity scores/number of activities Minimum detectable change (90%CI) for average score = 2 points Minimum detectable change (90%CI) for single activity score = 3 points     EDEMA:  Yes: moderate in right knee   GAIT: Assistive device utilized: Walker - 2 wheeled Level of assistance: Modified independence Comments: does not use AD PLOF    LOWER EXTREMITY ROM:     Active /PROM Right eval   Hip flexion    Hip extension    Hip abduction    Hip adduction    Hip internal rotation    Hip external rotation    Knee flexion 95/100   Knee  extension 5/4   Ankle dorsiflexion    Ankle plantarflexion    Ankle inversion    Ankle eversion     (Blank rows = not tested)   LOWER EXTREMITY MMT:    MMT Right eval Left eval  Hip flexion 4   Hip extension    Hip abduction 4   Hip adduction    Hip internal rotation    Hip external rotation    Knee flexion 4   Knee extension 4   Ankle dorsiflexion    Ankle plantarflexion    Ankle inversion    Ankle eversion     (Blank rows = not tested)    FUNCTIONAL TESTS:  12/25/23 5 times sit to stand: 23 seconds without UE support                                                                                                                              TREATMENT DATE:  01/01/24 Nu step L4 LE/UE, seat # 8, X 10 min HEP review with demonstration and trial set preformed, see below for details March walking with light bilat UE support, progressed to left UE support only in bars, 4 round trips Sidestepping in bars without UE  support 4 round trips  Seated knee flexion AAROM stretch 5 sec X 10, with LAQ in between Seated SLR with quad set 2X10 reps Seated hamstring curls green 2x10 Seated LAQ 3# 2X10 Sit to stand no UE support X 10 reps, slightly raised mat from standard chair then worked on sit to stand from low stool to simulate standing up from toilet so she can discontinue use of bed side commode chair. Supine knee extension heel prop stretch 3 min Manual:Rt knee PROM with overpressure to tolerance into flexion -Vasopnuematic device X 10 min, medium compression, 34 deg to Rt knee     PATIENT EDUCATION: Education details: HEP, PT plan of care, selfcare Person educated: Patient Education method: Explanation, Demonstration, Verbal cues, and Handouts Education comprehension: verbalized understanding, further education recommended   HOME EXERCISE PROGRAM: Access Code: [email or web address] URL: [email or web address] Date: 12/25/2023 Prepared by: Redell Moose  Exercises - Supine Heel Slide with Strap  - 2-3 x daily - 6 x weekly - 1 sets - 10 reps - 5 hold - Quad Setting and Stretching  - 2-3 x daily - 6 x weekly - 1 sets - 10 reps - 5 sec hold - seated knee flexion stretch with knee extension strengthening  - 2-3 x daily - 6 x weekly - 1 sets - 10 reps - 5 sec hold - Seated Straight Leg Raise with Quad Contraction  - 2-3 x daily - 6 x weekly - 2 sets - 10 reps - Sit to Stand Without Arm Support  - 2 x daily - 6 x weekly - 2 sets - 5 reps - Standing March with Counter Support  - 2 x daily - 6 x weekly - 1 sets - 10 reps  ASSESSMENT:  CLINICAL IMPRESSION: She shows good understanding and compliance with HEP. ROM and strength improving well up to this point. Continue PT plan of care.   Per Eval: Patient referred to PT S/P Rt TKA 12/03/23.Doing well up to this point but patient will benefit from skilled PT to improve overall function and to address impairments and limitations listed below.  OBJECTIVE IMPAIRMENTS: decreased activity tolerance for ADL's, difficulty walking, decreased balance, decreased endurance, decreased mobility, decreased ROM, decreased strength, impaired flexibility, impaired LE use, and pain.  ACTIVITY LIMITATIONS: bending, liftting, walking, standing, cleaning, community activity, driving,  PERSONAL FACTORS: see above PMH  also affecting patient's functional outcome.  REHAB POTENTIAL: Good  CLINICAL DECISION MAKING: Stable/uncomplicated  EVALUATION COMPLEXITY: Low    GOALS: Short term PT Goals Target date: 01/22/2024    Pt will be I and compliant with HEP. Baseline:  Goal status: New Pt will decrease pain by 25% overall Baseline: Goal status: New  Long term PT goals Target date:02/19/2024   Pt will improve right knee ROM to [facility] (3-115) to improve functional mobility Baseline: Goal status: New Pt will improve right knee strength to at least 5/5 MMT to improve functional strength Baseline: Goal  status: New Pt will improve Patient specific functional scale (PSFS) to at least7 /10 to show improved function level Baseline: Goal status: New Pt will reduce pain to overall less than 3/10 with usual activity and walking without assistance Baseline: Goal status: New Pt will improve 5 times sit to stand test to less than 13 seconds to show improved leg strength and balance Baseline: Goal status: New  PLAN: PT FREQUENCY: 2-3 times per week   PT DURATION: 6-8 weeks  PLANNED INTERVENTIONS  97110-Therapeutic exercises, 97530- Therapeutic activity, [id number]- Neuromuscular re-education, 97535- Self  Care, [id number]- Manual therapy, [phone]- Gait training, and 97016- Vasopneumatic device  PLAN FOR NEXT SESSION: knee ROM and early strength focus as tolerated, weaning off walker as able NEXT MD VISIT: not scheduled at eval.  Redell JONELLE Moose, PT,DPT 01/01/2024, 9:22 AM

## 2024-01-03 ENCOUNTER — Ambulatory Visit: Admitting: Physical Therapy

## 2024-01-03 ENCOUNTER — Encounter: Payer: Self-pay | Admitting: Physical Therapy

## 2024-01-03 DIAGNOSIS — M79604 Pain in right leg: Secondary | ICD-10-CM | POA: Diagnosis not present

## 2024-01-03 DIAGNOSIS — M6281 Muscle weakness (generalized): Secondary | ICD-10-CM

## 2024-01-03 DIAGNOSIS — R600 Localized edema: Secondary | ICD-10-CM

## 2024-01-03 DIAGNOSIS — R2689 Other abnormalities of gait and mobility: Secondary | ICD-10-CM

## 2024-01-03 NOTE — Therapy (Signed)
 OUTPATIENT PHYSICAL THERAPY TREATMENT   Patient Name: [name] [name] MRN: [id number] DOB:April 09, 1970, 53 y.o., female Today's Date: 01/03/2024  END OF SESSION:  PT End of Session - 01/03/24 0949     Visit Number 3    Number of Visits 15    Date for Recertification  02/19/24    PT Start Time 0927    PT Stop Time 1017    PT Time Calculation (min) 50 min    Activity Tolerance Patient tolerated treatment well    Behavior During Therapy [facility] for tasks assessed/performed           Past Medical History:  Diagnosis Date   Allergy    SEASONAL   Anxiety    Chronic headaches    Colon polyps    GERD (gastroesophageal reflux disease)    History of kidney stones    Hyperlipidemia    Hypertension    Hypothyroidism    Palpitations    Sleep apnea    Past Surgical History:  Procedure Laterality Date   CESAREAN SECTION     1194   COLONOSCOPY     KNEE ARTHROSCOPY     LITHOTRIPSY  12/2014   ROTATOR CUFF REPAIR Right 2021   TOTAL KNEE ARTHROPLASTY Right 12/03/2023   Procedure: ARTHROPLASTY, KNEE, TOTAL;  Surgeon: Addie Cordella Hamilton, MD;  Location: [facility] OR;  Service: Orthopedics;  Laterality: Right;   Patient Active Problem List   Diagnosis Date Noted   Arthritis of right knee 12/08/2023   S/P total knee replacement, right 12/03/2023   Chronic rhinitis 02/01/2020   S/P right rotator cuff repair 04/16/2019   Gastroesophageal reflux disease without esophagitis 07/21/2018   Hidradenitis suppurativa 10/01/2016   OSA (obstructive sleep apnea) 05/13/2015   Morbid obesity (HCC) 05/13/2015   Hypothyroidism 03/27/2014   Panic attacks 03/27/2014   SVT (supraventricular tachycardia) 06/26/2012   Migraines 06/26/2012    PCP: Gladis Mustard, FNP   REFERRING PROVIDER: Addie Cordella Hamilton, MD   REFERRING DIAG:  Diagnosis  [id number] (ICD-10-CM) - Status post total right knee replacement    Rationale for Evaluation and Treatment:  Rehabiliation  THERAPY DIAG:  Muscle weakness  (generalized)  Pain in right leg  Other abnormalities of gait and mobility  Localized edema  ONSET DATE: Rt TKA 12/03/23   SUBJECTIVE:                                                                                                                                                                                           SUBJECTIVE STATEMENT: She says she did not sleep well last night because of her knee pain.    PERTINENT  HISTORY:  See above PMH  PAIN:  NPRS scale: 7/10 upon arrival Pain location:Right knee in front Pain description: constant, achy, sharp Aggravating factors: lifting leg up, bending, extending Relieving factors: ice, meds   PRECAUTIONS: ,  None  RED FLAGS: None   WEIGHT BEARING RESTRICTIONS:  No  FALLS:  Has patient fallen in last 6 months? No   OCCUPATION:  None, in between jobs  PLOF:  Independent  PATIENT GOALS:  Reduce   OBJECTIVE:  Note: Objective measures were completed at Evaluation unless otherwise noted.   PATIENT SURVEYS:  Patient-Specific Activity Scoring Scheme  0 represents "unable to perform." 10 represents "able to perform at prior level. 0 1 2 3 4 5 6 7 8 9  10 (Date and Score)   Activity Eval     1. Walking unassisted 3     2. Sitting in chair to eat 1    3.     4.    5.    Score 2/10    Total score = sum of the activity scores/number of activities Minimum detectable change (90%CI) for average score = 2 points Minimum detectable change (90%CI) for single activity score = 3 points     EDEMA:  Yes: moderate in right knee   GAIT: Assistive device utilized: Walker - 2 wheeled Level of assistance: Modified independence Comments: does not use AD PLOF    LOWER EXTREMITY ROM:     Active /PROM Right eval Right 01/03/24  Hip flexion    Hip extension    Hip abduction    Hip adduction    Hip internal rotation    Hip external rotation    Knee flexion 95/100 106/110  Knee extension 5/4 3/  Ankle  dorsiflexion    Ankle plantarflexion    Ankle inversion    Ankle eversion     (Blank rows = not tested)   LOWER EXTREMITY MMT:    MMT Right eval Left eval  Hip flexion 4   Hip extension    Hip abduction 4   Hip adduction    Hip internal rotation    Hip external rotation    Knee flexion 4   Knee extension 4   Ankle dorsiflexion    Ankle plantarflexion    Ankle inversion    Ankle eversion     (Blank rows = not tested)    FUNCTIONAL TESTS:  12/25/23 5 times sit to stand: 23 seconds without UE support                                                                                                                              TREATMENT DATE:  01/03/24 Nu step L3 LE/UE, seat # 8, X 10 min March walking with light bilat UE support, progressed to left UE support only in bars, 4 round trips Sidestepping in bars without UE support 4 round trips  Seated knee flexion AAROM stretch 5 sec X 10, with LAQ  in between Seated SLR with quad set 2X10 reps Seated hamstring curls green 2x10 Seated LAQ 3# 2X10 Sit to stand from chair without arms like she will have to do at Sunday school this week. Worked on how to do this with walker and how to do this without walker by turning sideways and using one UE to push up from chair back.  Supine knee extension heel prop stretch 3 min Manual:Rt knee PROM with overpressure to tolerance into flexion, manual hamstring stretching  -self care: discussed options for knee compression sleeves that may benefit her, showed her some good options on [employer] and measured her leg for proper size. Discussed return to driving plan and return to walking around supermarket plan.    -Vasopnuematic device X 10 min, medium compression, 34 deg to Rt knee  01/01/24 Nu step L4 LE/UE, seat # 8, X 10 min HEP review with demonstration and trial set preformed, see below for details March walking with light bilat UE support, progressed to left UE support only in bars, 4  round trips Sidestepping in bars without UE support 4 round trips  Seated knee flexion AAROM stretch 5 sec X 10, with LAQ in between Seated SLR with quad set 2X10 reps Seated hamstring curls green 2x10 Seated LAQ 3# 2X10 Sit to stand no UE support X 10 reps, slightly raised mat from standard chair then worked on sit to stand from low stool to simulate standing up from toilet so she can discontinue use of bed side commode chair. Supine knee extension heel prop stretch 3 min Manual:Rt knee PROM with overpressure to tolerance into flexion -Vasopnuematic device X 10 min, medium compression, 34 deg to Rt knee     PATIENT EDUCATION: Education details: HEP, PT plan of care, selfcare Person educated: Patient Education method: Explanation, Demonstration, Verbal cues, and Handouts Education comprehension: verbalized understanding, further education recommended   HOME EXERCISE PROGRAM: Access Code: [email or web address] URL: [email or web address] Date: 12/25/2023 Prepared by: Redell Moose  Exercises - Supine Heel Slide with Strap  - 2-3 x daily - 6 x weekly - 1 sets - 10 reps - 5 hold - Quad Setting and Stretching  - 2-3 x daily - 6 x weekly - 1 sets - 10 reps - 5 sec hold - seated knee flexion stretch with knee extension strengthening  - 2-3 x daily - 6 x weekly - 1 sets - 10 reps - 5 sec hold - Seated Straight Leg Raise with Quad Contraction  - 2-3 x daily - 6 x weekly - 2 sets - 10 reps - Sit to Stand Without Arm Support  - 2 x daily - 6 x weekly - 2 sets - 5 reps - Standing March with Counter Support  - 2 x daily - 6 x weekly - 1 sets - 10 reps  ASSESSMENT:  CLINICAL IMPRESSION: She arrives with more pain but still had good tolerance to ROM and strength program today. Her ROM is progressing very well, see updated measurements.    Per Eval: Patient referred to PT S/P Rt TKA 12/03/23.Doing well up to this point but patient will benefit from skilled PT to improve overall function and to  address impairments and limitations listed below.  OBJECTIVE IMPAIRMENTS: decreased activity tolerance for ADL's, difficulty walking, decreased balance, decreased endurance, decreased mobility, decreased ROM, decreased strength, impaired flexibility, impaired LE use, and pain.  ACTIVITY LIMITATIONS: bending, liftting, walking, standing, cleaning, community activity, driving,  PERSONAL FACTORS: see above PMH  also affecting patient's functional outcome.  REHAB POTENTIAL: Good  CLINICAL DECISION MAKING: Stable/uncomplicated  EVALUATION COMPLEXITY: Low    GOALS: Short term PT Goals Target date: 01/22/2024    Pt will be I and compliant with HEP. Baseline:  Goal status: New Pt will decrease pain by 25% overall Baseline: Goal status: New  Long term PT goals Target date:02/19/2024   Pt will improve right knee ROM to [facility] (3-115) to improve functional mobility Baseline: Goal status: New Pt will improve right knee strength to at least 5/5 MMT to improve functional strength Baseline: Goal status: New Pt will improve Patient specific functional scale (PSFS) to at least7 /10 to show improved function level Baseline: Goal status: New Pt will reduce pain to overall less than 3/10 with usual activity and walking without assistance Baseline: Goal status: New Pt will improve 5 times sit to stand test to less than 13 seconds to show improved leg strength and balance Baseline: Goal status: New  PLAN: PT FREQUENCY: 2-3 times per week   PT DURATION: 6-8 weeks  PLANNED INTERVENTIONS  97110-Therapeutic exercises, 97530- Therapeutic activity, 97112- Neuromuscular re-education, 97535- Self Care, 02859- Manual therapy, [id number]- Gait training, and 97016- Vasopneumatic device  PLAN FOR NEXT SESSION: Work on return to driving and return to walking around supermarket. knee ROM and early strength focus as tolerated, weaning off walker as able NEXT MD VISIT: not scheduled at eval.  Redell JONELLE Moose, PT,DPT 01/03/2024, 10:30 AM

## 2024-01-06 ENCOUNTER — Encounter: Payer: Self-pay | Admitting: Physical Therapy

## 2024-01-06 ENCOUNTER — Ambulatory Visit: Admitting: Physical Therapy

## 2024-01-06 DIAGNOSIS — M6281 Muscle weakness (generalized): Secondary | ICD-10-CM

## 2024-01-06 DIAGNOSIS — M79604 Pain in right leg: Secondary | ICD-10-CM | POA: Diagnosis not present

## 2024-01-06 DIAGNOSIS — R600 Localized edema: Secondary | ICD-10-CM

## 2024-01-06 DIAGNOSIS — R2689 Other abnormalities of gait and mobility: Secondary | ICD-10-CM

## 2024-01-06 NOTE — Therapy (Signed)
 OUTPATIENT PHYSICAL THERAPY TREATMENT   Patient Name: [name] [name] MRN: [id number] DOB:1970/04/06, 53 y.o., female Today's Date: 01/06/2024  END OF SESSION:  PT End of Session - 01/06/24 1506     Visit Number 4    Number of Visits 15    Date for Recertification  02/19/24    PT Start Time 1505    PT Stop Time 1545    PT Time Calculation (min) 40 min    Activity Tolerance Patient tolerated treatment well    Behavior During Therapy [facility] for tasks assessed/performed           Past Medical History:  Diagnosis Date   Allergy    SEASONAL   Anxiety    Chronic headaches    Colon polyps    GERD (gastroesophageal reflux disease)    History of kidney stones    Hyperlipidemia    Hypertension    Hypothyroidism    Palpitations    Sleep apnea    Past Surgical History:  Procedure Laterality Date   CESAREAN SECTION     1194   COLONOSCOPY     KNEE ARTHROSCOPY     LITHOTRIPSY  12/2014   ROTATOR CUFF REPAIR Right 2021   TOTAL KNEE ARTHROPLASTY Right 12/03/2023   Procedure: ARTHROPLASTY, KNEE, TOTAL;  Surgeon: Addie Cordella Hamilton, MD;  Location: [facility] OR;  Service: Orthopedics;  Laterality: Right;   Patient Active Problem List   Diagnosis Date Noted   Arthritis of right knee 12/08/2023   S/P total knee replacement, right 12/03/2023   Chronic rhinitis 02/01/2020   S/P right rotator cuff repair 04/16/2019   Gastroesophageal reflux disease without esophagitis 07/21/2018   Hidradenitis suppurativa 10/01/2016   OSA (obstructive sleep apnea) 05/13/2015   Morbid obesity (HCC) 05/13/2015   Hypothyroidism 03/27/2014   Panic attacks 03/27/2014   SVT (supraventricular tachycardia) 06/26/2012   Migraines 06/26/2012    PCP: Gladis Mustard, FNP   REFERRING PROVIDER: Gladis, Mary-Margaret, *   REFERRING DIAG:  Diagnosis  [phone] (ICD-10-CM) - Status post total right knee replacement    Rationale for Evaluation and Treatment:  Rehabiliation  THERAPY DIAG:  Muscle weakness  (generalized)  Pain in right leg  Other abnormalities of gait and mobility  Localized edema  ONSET DATE: Rt TKA 12/03/23   SUBJECTIVE:                                                                                                                                                                                           SUBJECTIVE STATEMENT: She says she was able to attend church and did well with getting up/down from the chairs and pews.  PERTINENT HISTORY:  See above PMH  PAIN:  NPRS scale: 2/10 upon arrival Pain location:Right knee in front Pain description: constant, achy, sharp Aggravating factors: lifting leg up, bending, extending Relieving factors: ice, meds   PRECAUTIONS: ,  None  RED FLAGS: None   WEIGHT BEARING RESTRICTIONS:  No  FALLS:  Has patient fallen in last 6 months? No   OCCUPATION:  None, in between jobs  PLOF:  Independent  PATIENT GOALS:  Reduce   OBJECTIVE:  Note: Objective measures were completed at Evaluation unless otherwise noted.   PATIENT SURVEYS:  Patient-Specific Activity Scoring Scheme  0 represents "unable to perform." 10 represents "able to perform at prior level. 0 1 2 3 4 5 6 7 8 9  10 (Date and Score)   Activity Eval     1. Walking unassisted 3     2. Sitting in chair to eat 1    3.     4.    5.    Score 2/10    Total score = sum of the activity scores/number of activities Minimum detectable change (90%CI) for average score = 2 points Minimum detectable change (90%CI) for single activity score = 3 points     EDEMA:  Yes: moderate in right knee   GAIT: Assistive device utilized: Walker - 2 wheeled Level of assistance: Modified independence Comments: does not use AD PLOF    LOWER EXTREMITY ROM:     Active /PROM Right eval Right 01/03/24  Hip flexion    Hip extension    Hip abduction    Hip adduction    Hip internal rotation    Hip external rotation    Knee flexion 95/100 106/110   Knee extension 5/4 3/  Ankle dorsiflexion    Ankle plantarflexion    Ankle inversion    Ankle eversion     (Blank rows = not tested)   LOWER EXTREMITY MMT:    MMT Right eval Left eval  Hip flexion 4   Hip extension    Hip abduction 4   Hip adduction    Hip internal rotation    Hip external rotation    Knee flexion 4   Knee extension 4   Ankle dorsiflexion    Ankle plantarflexion    Ankle inversion    Ankle eversion     (Blank rows = not tested)    FUNCTIONAL TESTS:  12/25/23 5 times sit to stand: 23 seconds without UE support                                                                                                                              TREATMENT DATE:  01/06/24 Nu step L5 LE/UE, seat # 8, X 10 min Bike seat#16, L1 X 5 min Driving simulation with blaze pods, 3 pods, 30 seconds X 3, avg reaction time 450 ms.  March walking 4 round trips Sidestepping in bars without UE support 4 round trips  Seated knee flexion AAROM stretch 5 sec X 10, with LAQ in between Seated SLR with quad set 2X15 reps Seated hamstring curls green 2x10 Seated LAQ 3# 2X10 Sit to stand from low mat surface X 10 reps without UE support Step ups 4 inch step X 10 forward and X 10 lateral with UE support, leading with Rt leg.  -Manual:Rt knee PROM with overpressure to tolerance into flexion, manual hamstring stretching   -Vasopnuematic device X 10 min, medium compression, 34 deg to Rt knee    PATIENT EDUCATION: Education details: HEP, PT plan of care, selfcare Person educated: Patient Education method: Explanation, Demonstration, Verbal cues, and Handouts Education comprehension: verbalized understanding, further education recommended   HOME EXERCISE PROGRAM: Access Code: [email or web address] URL: [email or web address] Date: 12/25/2023 Prepared by: Redell Moose  Exercises - Supine Heel Slide with Strap  - 2-3 x daily - 6 x weekly - 1 sets - 10 reps - 5 hold - Quad  Setting and Stretching  - 2-3 x daily - 6 x weekly - 1 sets - 10 reps - 5 sec hold - seated knee flexion stretch with knee extension strengthening  - 2-3 x daily - 6 x weekly - 1 sets - 10 reps - 5 sec hold - Seated Straight Leg Raise with Quad Contraction  - 2-3 x daily - 6 x weekly - 2 sets - 10 reps - Sit to Stand Without Arm Support  - 2 x daily - 6 x weekly - 2 sets - 5 reps - Standing March with Counter Support  - 2 x daily - 6 x weekly - 1 sets - 10 reps  ASSESSMENT:  CLINICAL IMPRESSION: She continues to do well with her post op TKA rehab.She has enough knee ROM perform full revolutions on the bike now.  I progressed strength program some today with good tolerance and had her do driving simulation in clinic today which she was able to show good control of her leg and good reaction time noted.   Per Eval: Patient referred to PT S/P Rt TKA 12/03/23.Doing well up to this point but patient will benefit from skilled PT to improve overall function and to address impairments and limitations listed below.  OBJECTIVE IMPAIRMENTS: decreased activity tolerance for ADL's, difficulty walking, decreased balance, decreased endurance, decreased mobility, decreased ROM, decreased strength, impaired flexibility, impaired LE use, and pain.  ACTIVITY LIMITATIONS: bending, liftting, walking, standing, cleaning, community activity, driving,  PERSONAL FACTORS: see above PMH  also affecting patient's functional outcome.  REHAB POTENTIAL: Good  CLINICAL DECISION MAKING: Stable/uncomplicated  EVALUATION COMPLEXITY: Low    GOALS: Short term PT Goals Target date: 01/22/2024    Pt will be I and compliant with HEP. Baseline:  Goal status: MET 01/06/24 Pt will decrease pain by 25% overall Baseline: Goal status: MET 01/06/24  Long term PT goals Target date:02/19/2024   Pt will improve right knee ROM to [facility] (3-115) to improve functional mobility Baseline: Goal status: ongoing 01/04/24 Pt will  improve right knee strength to at least 5/5 MMT to improve functional strength Baseline: Goal status: ongoing 01/04/24 Pt will improve Patient specific functional scale (PSFS) to at least7 /10 to show improved function level Baseline: Goal status: ongoing 01/04/24 Pt will reduce pain to overall less than 3/10 with usual activity and walking without assistance Baseline: Goal status: ongoing 01/04/24 Pt will improve 5 times sit to stand test to less than 13 seconds to show improved leg strength and balance Baseline: Goal status: ongoing 01/04/24  PLAN:  PT FREQUENCY: 2-3 times per week   PT DURATION: 6-8 weeks  PLANNED INTERVENTIONS  97110-Therapeutic exercises, 97530- Therapeutic activity, [id number]- Neuromuscular re-education, 97535- Self Care, [id number]- Manual therapy, [phone]- Gait training, and 97016- Vasopneumatic device  PLAN FOR NEXT SESSION: Work on return to driving and return to walking around supermarket. knee ROM and early strength focus as tolerated, weaning off walker as able NEXT MD VISIT: not scheduled at eval.  Redell JONELLE Moose, PT,DPT 01/06/2024, 3:07 PM

## 2024-01-08 ENCOUNTER — Encounter: Payer: Self-pay | Admitting: Physical Therapy

## 2024-01-08 ENCOUNTER — Ambulatory Visit: Admitting: Physical Therapy

## 2024-01-08 DIAGNOSIS — R600 Localized edema: Secondary | ICD-10-CM

## 2024-01-08 DIAGNOSIS — R2689 Other abnormalities of gait and mobility: Secondary | ICD-10-CM

## 2024-01-08 DIAGNOSIS — M79604 Pain in right leg: Secondary | ICD-10-CM | POA: Diagnosis not present

## 2024-01-08 DIAGNOSIS — M6281 Muscle weakness (generalized): Secondary | ICD-10-CM

## 2024-01-08 NOTE — Therapy (Signed)
 OUTPATIENT PHYSICAL THERAPY TREATMENT   Patient Name: [name] [name] MRN: [id number] DOB:26-Mar-1970, 53 y.o., female Today's Date: 01/08/2024  END OF SESSION:  PT End of Session - 01/08/24 1511     Visit Number 5    Number of Visits 15    Date for Recertification  02/19/24    PT Start Time 1505    PT Stop Time 1600    PT Time Calculation (min) 55 min    Activity Tolerance Patient tolerated treatment well    Behavior During Therapy [facility] for tasks assessed/performed           Past Medical History:  Diagnosis Date   Allergy    SEASONAL   Anxiety    Chronic headaches    Colon polyps    GERD (gastroesophageal reflux disease)    History of kidney stones    Hyperlipidemia    Hypertension    Hypothyroidism    Palpitations    Sleep apnea    Past Surgical History:  Procedure Laterality Date   CESAREAN SECTION     1194   COLONOSCOPY     KNEE ARTHROSCOPY     LITHOTRIPSY  12/2014   ROTATOR CUFF REPAIR Right 2021   TOTAL KNEE ARTHROPLASTY Right 12/03/2023   Procedure: ARTHROPLASTY, KNEE, TOTAL;  Surgeon: Addie Cordella Hamilton, MD;  Location: [facility] OR;  Service: Orthopedics;  Laterality: Right;   Patient Active Problem List   Diagnosis Date Noted   Arthritis of right knee 12/08/2023   S/P total knee replacement, right 12/03/2023   Chronic rhinitis 02/01/2020   S/P right rotator cuff repair 04/16/2019   Gastroesophageal reflux disease without esophagitis 07/21/2018   Hidradenitis suppurativa 10/01/2016   OSA (obstructive sleep apnea) 05/13/2015   Morbid obesity (HCC) 05/13/2015   Hypothyroidism 03/27/2014   Panic attacks 03/27/2014   SVT (supraventricular tachycardia) 06/26/2012   Migraines 06/26/2012    PCP: Gladis Mustard, FNP   REFERRING PROVIDER: Gladis, Mary-Margaret, *   REFERRING DIAG:  Diagnosis  [phone] (ICD-10-CM) - Status post total right knee replacement    Rationale for Evaluation and Treatment:  Rehabiliation  THERAPY DIAG:  Muscle weakness  (generalized)  Pain in right leg  Other abnormalities of gait and mobility  Localized edema  ONSET DATE: Rt TKA 12/03/23   SUBJECTIVE:                                                                                                                                                                                           SUBJECTIVE STATEMENT: [name] not really having pain today in her knee just some discomfort and soreness  PERTINENT HISTORY:  See above PMH  PAIN:  NPRS scale: 0/10 upon arrival Pain location:Right knee in front Pain description: discomfort and soreness Aggravating factors: lifting leg up, bending, extending Relieving factors: ice, meds   PRECAUTIONS: ,  None  RED FLAGS: None   WEIGHT BEARING RESTRICTIONS:  No  FALLS:  Has patient fallen in last 6 months? No   OCCUPATION:  None, in between jobs  PLOF:  Independent  PATIENT GOALS:  Reduce   OBJECTIVE:  Note: Objective measures were completed at Evaluation unless otherwise noted.   PATIENT SURVEYS:  Patient-Specific Activity Scoring Scheme  0 represents "unable to perform." 10 represents "able to perform at prior level. 0 1 2 3 4 5 6 7 8 9  10 (Date and Score)   Activity Eval     1. Walking unassisted 3     2. Sitting in chair to eat 1    3.     4.    5.    Score 2/10    Total score = sum of the activity scores/number of activities Minimum detectable change (90%CI) for average score = 2 points Minimum detectable change (90%CI) for single activity score = 3 points     EDEMA:  Yes: moderate in right knee   GAIT: Assistive device utilized: Walker - 2 wheeled Level of assistance: Modified independence Comments: does not use AD PLOF    LOWER EXTREMITY ROM:     Active /PROM Right eval Right 01/03/24  Hip flexion    Hip extension    Hip abduction    Hip adduction    Hip internal rotation    Hip external rotation    Knee flexion 95/100 106/110  Knee extension 5/4  3/  Ankle dorsiflexion    Ankle plantarflexion    Ankle inversion    Ankle eversion     (Blank rows = not tested)   LOWER EXTREMITY MMT:    MMT Right eval Left eval  Hip flexion 4   Hip extension    Hip abduction 4   Hip adduction    Hip internal rotation    Hip external rotation    Knee flexion 4   Knee extension 4   Ankle dorsiflexion    Ankle plantarflexion    Ankle inversion    Ankle eversion     (Blank rows = not tested)    FUNCTIONAL TESTS:  12/25/23 5 times sit to stand: 23 seconds without UE support                                                                                                                              TREATMENT DATE:  01/08/24 Bike seat#16, L1 X 5 min March walking 4 round trips with red band around knees without UE support Sidestepping in bars without UE support 4 round trips with red band around knees Seated knee flexion AAROM stretch 5 sec X 15, with LAQ in between Seated SLR with quad set  2X15 reps Sit to stand from low mat surface X 10 reps without UE support Step ups 6 inch step X 10 forward and X 10 lateral with UE support, leading with Rt leg. Seated hamstring curl machine DL 89# [id number] Seated leg extension machine DL 89# X 10, then right leg only 10# X 10 Leg press machine DL 59# [id number]  -Manual:Rt knee PROM with overpressure to tolerance into flexion, manual hamstring stretching   -Vasopnuematic device X 10 min, medium compression, 34 deg to Rt knee  01/06/24 Nu step L5 LE/UE, seat # 8, X 10 min Bike seat#16, L1 X 5 min Driving simulation with blaze pods, 3 pods, 30 seconds X 3, avg reaction time 450 ms.  March walking 4 round trips Sidestepping in bars without UE support 4 round trips  Seated knee flexion AAROM stretch 5 sec X 10, with LAQ in between Seated SLR with quad set 2X15 reps Seated hamstring curls green 2x10 Seated LAQ 3# 2X10 Sit to stand from low mat surface X 10 reps without UE support Step ups 4 inch step  X 10 forward and X 10 lateral with UE support, leading with Rt leg.  -Manual:Rt knee PROM with overpressure to tolerance into flexion, manual hamstring stretching   -Vasopnuematic device X 10 min, medium compression, 34 deg to Rt knee    PATIENT EDUCATION: Education details: HEP, PT plan of care, selfcare Person educated: Patient Education method: Explanation, Demonstration, Verbal cues, and Handouts Education comprehension: verbalized understanding, further education recommended   HOME EXERCISE PROGRAM: Access Code: [email or web address] URL: [email or web address] Date: 12/25/2023 Prepared by: Redell Moose  Exercises - Supine Heel Slide with Strap  - 2-3 x daily - 6 x weekly - 1 sets - 10 reps - 5 hold - Quad Setting and Stretching  - 2-3 x daily - 6 x weekly - 1 sets - 10 reps - 5 sec hold - seated knee flexion stretch with knee extension strengthening  - 2-3 x daily - 6 x weekly - 1 sets - 10 reps - 5 sec hold - Seated Straight Leg Raise with Quad Contraction  - 2-3 x daily - 6 x weekly - 2 sets - 10 reps - Sit to Stand Without Arm Support  - 2 x daily - 6 x weekly - 2 sets - 5 reps - Standing March with Counter Support  - 2 x daily - 6 x weekly - 1 sets - 10 reps  ASSESSMENT:  CLINICAL IMPRESSION: [name] making good progress. I feel she is ready to discontinue use of cane inside her home now but recommend she still use it outside of the home for now until gait steadiness improves a little more.   Per Eval: Patient referred to PT S/P Rt TKA 12/03/23.Doing well up to this point but patient will benefit from skilled PT to improve overall function and to address impairments and limitations listed below.  OBJECTIVE IMPAIRMENTS: decreased activity tolerance for ADL's, difficulty walking, decreased balance, decreased endurance, decreased mobility, decreased ROM, decreased strength, impaired flexibility, impaired LE use, and pain.  ACTIVITY LIMITATIONS: bending, liftting, walking,  standing, cleaning, community activity, driving,  PERSONAL FACTORS: see above PMH  also affecting patient's functional outcome.  REHAB POTENTIAL: Good  CLINICAL DECISION MAKING: Stable/uncomplicated  EVALUATION COMPLEXITY: Low    GOALS: Short term PT Goals Target date: 01/22/2024    Pt will be I and compliant with HEP. Baseline:  Goal status: MET 01/06/24 Pt will decrease pain by 25% overall Baseline: Goal status:  MET 01/06/24  Long term PT goals Target date:02/19/2024   Pt will improve right knee ROM to [facility] (3-115) to improve functional mobility Baseline: Goal status: ongoing 01/04/24 Pt will improve right knee strength to at least 5/5 MMT to improve functional strength Baseline: Goal status: ongoing 01/04/24 Pt will improve Patient specific functional scale (PSFS) to at least7 /10 to show improved function level Baseline: Goal status: ongoing 01/04/24 Pt will reduce pain to overall less than 3/10 with usual activity and walking without assistance Baseline: Goal status: ongoing 01/04/24 Pt will improve 5 times sit to stand test to less than 13 seconds to show improved leg strength and balance Baseline: Goal status: ongoing 01/04/24  PLAN: PT FREQUENCY: 2-3 times per week   PT DURATION: 6-8 weeks  PLANNED INTERVENTIONS  97110-Therapeutic exercises, 97530- Therapeutic activity, 97112- Neuromuscular re-education, 97535- Self Care, 02859- Manual therapy, 97116- Gait training, and 97016- Vasopneumatic device  PLAN FOR NEXT SESSION: Work on return to driving and return to walking around supermarket. knee ROM and early strength focus as tolerated, weaning off walker as able NEXT MD VISIT: not scheduled at eval.  Redell JONELLE Moose, PT,DPT 01/08/2024, 4:02 PM

## 2024-01-13 ENCOUNTER — Telehealth: Payer: Self-pay | Admitting: Orthopedic Surgery

## 2024-01-13 ENCOUNTER — Encounter: Payer: Self-pay | Admitting: Radiology

## 2024-01-13 NOTE — Telephone Encounter (Signed)
 Rx refill Oxycodone   [facility]

## 2024-01-14 ENCOUNTER — Ambulatory Visit: Attending: Orthopedic Surgery | Admitting: Physical Therapy

## 2024-01-14 ENCOUNTER — Other Ambulatory Visit: Payer: Self-pay | Admitting: Surgical

## 2024-01-14 ENCOUNTER — Encounter: Payer: Self-pay | Admitting: Physical Therapy

## 2024-01-14 DIAGNOSIS — M79604 Pain in right leg: Secondary | ICD-10-CM | POA: Diagnosis present

## 2024-01-14 DIAGNOSIS — M6281 Muscle weakness (generalized): Secondary | ICD-10-CM | POA: Diagnosis present

## 2024-01-14 DIAGNOSIS — R2689 Other abnormalities of gait and mobility: Secondary | ICD-10-CM | POA: Insufficient documentation

## 2024-01-14 DIAGNOSIS — R600 Localized edema: Secondary | ICD-10-CM | POA: Insufficient documentation

## 2024-01-14 MED ORDER — OXYCODONE HCL 5 MG PO CAPS: 5.0000 mg | ORAL_CAPSULE | Freq: Four times a day (QID) | ORAL | 0 refills | Status: AC | PRN

## 2024-01-14 NOTE — Telephone Encounter (Signed)
 Setn in refill

## 2024-01-14 NOTE — Therapy (Signed)
 OUTPATIENT PHYSICAL THERAPY TREATMENT   Patient Name: [name] [name] MRN: [id number] DOB:[birth year], 53 y.o., female Today's Date: 01/14/2024  END OF SESSION:  PT End of Session - 01/14/24 1418     Visit Number 6    Number of Visits 15    Date for Recertification  02/19/24    PT Start Time 1416    PT Stop Time 1506    PT Time Calculation (min) 50 min    Activity Tolerance Patient tolerated treatment well    Behavior During Therapy [facility] for tasks assessed/performed           Past Medical History:  Diagnosis Date   Allergy    SEASONAL   Anxiety    Chronic headaches    Colon polyps    GERD (gastroesophageal reflux disease)    History of kidney stones    Hyperlipidemia    Hypertension    Hypothyroidism    Palpitations    Sleep apnea    Past Surgical History:  Procedure Laterality Date   CESAREAN SECTION     1194   COLONOSCOPY     KNEE ARTHROSCOPY     LITHOTRIPSY  12/2014   ROTATOR CUFF REPAIR Right 2021   TOTAL KNEE ARTHROPLASTY Right 12/03/2023   Procedure: ARTHROPLASTY, KNEE, TOTAL;  Surgeon: Addie Cordella Hamilton, MD;  Location: [facility] OR;  Service: Orthopedics;  Laterality: Right;   Patient Active Problem List   Diagnosis Date Noted   Arthritis of right knee 12/08/2023   S/P total knee replacement, right 12/03/2023   Chronic rhinitis 02/01/2020   S/P right rotator cuff repair 04/16/2019   Gastroesophageal reflux disease without esophagitis 07/21/2018   Hidradenitis suppurativa 10/01/2016   OSA (obstructive sleep apnea) 05/13/2015   Morbid obesity (HCC) 05/13/2015   Hypothyroidism 03/27/2014   Panic attacks 03/27/2014   SVT (supraventricular tachycardia) 06/26/2012   Migraines 06/26/2012    PCP: Gladis Mustard, FNP   REFERRING PROVIDER: Addie Cordella Hamilton, MD   REFERRING DIAG:  Diagnosis  [phone] (ICD-10-CM) - Status post total right knee replacement    Rationale for Evaluation and Treatment:  Rehabiliation  THERAPY DIAG:  Muscle weakness  (generalized)  Pain in right leg  Other abnormalities of gait and mobility  Localized edema  ONSET DATE: Rt TKA 12/03/23   SUBJECTIVE:                                                                                                                                                                                           SUBJECTIVE STATEMENT: [name] a more sore today, had to get a lot of groceries so overdid it some.  PERTINENT HISTORY:  See above PMH  PAIN:  NPRS scale: 4/10 upon arrival Pain location:Right knee in front Pain description: discomfort and soreness Aggravating factors: lifting leg up, bending, extending Relieving factors: ice, meds   PRECAUTIONS: ,  None  RED FLAGS: None   WEIGHT BEARING RESTRICTIONS:  No  FALLS:  Has patient fallen in last 6 months? No   OCCUPATION:  None, in between jobs  PLOF:  Independent  PATIENT GOALS:  Reduce   OBJECTIVE:  Note: Objective measures were completed at Evaluation unless otherwise noted.   PATIENT SURVEYS:  Patient-Specific Activity Scoring Scheme  0 represents "unable to perform." 10 represents "able to perform at prior level. 0 1 2 3 4 5 6 7 8 9  10 (Date and Score)   Activity Eval   01/14/24  1. Walking unassisted 3   8  2. Sitting in chair to eat 1 8   3.     4.    5.    Score 2/10 8/10   Total score = sum of the activity scores/number of activities Minimum detectable change (90%CI) for average score = 2 points Minimum detectable change (90%CI) for single activity score = 3 points     EDEMA:  Yes: moderate in right knee   GAIT: Assistive device utilized: Walker - 2 wheeled Level of assistance: Modified independence Comments: does not use AD PLOF    LOWER EXTREMITY ROM:     Active /PROM Right eval Right 01/03/24 Right 01/14/24  Hip flexion     Hip extension     Hip abduction     Hip adduction     Hip internal rotation     Hip external rotation     Knee flexion 95/100  106/110 110/115  Knee extension 5/4 3/ 0  Ankle dorsiflexion     Ankle plantarflexion     Ankle inversion     Ankle eversion      (Blank rows = not tested)   LOWER EXTREMITY MMT:    MMT Right eval   Hip flexion 4   Hip extension    Hip abduction 4   Hip adduction    Hip internal rotation    Hip external rotation    Knee flexion 4   Knee extension 4   Ankle dorsiflexion    Ankle plantarflexion    Ankle inversion    Ankle eversion     (Blank rows = not tested)    FUNCTIONAL TESTS:  12/25/23 5 times sit to stand: 23 seconds without UE support                                                                                                                              TREATMENT DATE:  01/14/24 Bike seat#16, L1 X 10 min Hamstring stretch on stairs 30 sec X 3 Stairs reciprocal pattern up/down 5 steps X 3 round trips with bilat UE support Sit to stand from low mat surface  X 10 reps without UE support Seated hamstring curl machine DL 89# [id number] Seated leg extension machine DL 89# X 10, then right leg only 10# X 10 Leg press machine DL 59# [id number] PROM knee flexion X 5 min   -Vasopnuematic device X 10 min, medium compression, 34 deg to Rt knee   PATIENT EDUCATION: Education details: HEP, PT plan of care, selfcare Person educated: Patient Education method: Explanation, Demonstration, Verbal cues, and Handouts Education comprehension: verbalized understanding, further education recommended   HOME EXERCISE PROGRAM: Access Code: [email or web address] URL: [email or web address] Date: 12/25/2023 Prepared by: Redell Moose  Exercises - Supine Heel Slide with Strap  - 2-3 x daily - 6 x weekly - 1 sets - 10 reps - 5 hold - Quad Setting and Stretching  - 2-3 x daily - 6 x weekly - 1 sets - 10 reps - 5 sec hold - seated knee flexion stretch with knee extension strengthening  - 2-3 x daily - 6 x weekly - 1 sets - 10 reps - 5 sec hold - Seated Straight Leg Raise with Quad  Contraction  - 2-3 x daily - 6 x weekly - 2 sets - 10 reps - Sit to Stand Without Arm Support  - 2 x daily - 6 x weekly - 2 sets - 5 reps - Standing March with Counter Support  - 2 x daily - 6 x weekly - 1 sets - 10 reps  ASSESSMENT:  CLINICAL IMPRESSION: [name] making excellent progress thus far. Her knee ROM is doing excellent and her strength is coming along. She still has some expected weakness that we are working to improve with PT.  Per Eval: Patient referred to PT S/P Rt TKA 12/03/23.Doing well up to this point but patient will benefit from skilled PT to improve overall function and to address impairments and limitations listed below.  OBJECTIVE IMPAIRMENTS: decreased activity tolerance for ADL's, difficulty walking, decreased balance, decreased endurance, decreased mobility, decreased ROM, decreased strength, impaired flexibility, impaired LE use, and pain.  ACTIVITY LIMITATIONS: bending, liftting, walking, standing, cleaning, community activity, driving,  PERSONAL FACTORS: see above PMH  also affecting patient's functional outcome.  REHAB POTENTIAL: Good  CLINICAL DECISION MAKING: Stable/uncomplicated  EVALUATION COMPLEXITY: Low    GOALS: Short term PT Goals Target date: 01/22/2024    Pt will be I and compliant with HEP. Baseline:  Goal status: MET 01/06/24 Pt will decrease pain by 25% overall Baseline: Goal status: MET 01/06/24  Long term PT goals Target date:02/19/2024   Pt will improve right knee ROM to [facility] (3-115) to improve functional mobility Baseline: Goal status: ongoing 01/04/24 Pt will improve right knee strength to at least 5/5 MMT to improve functional strength Baseline: Goal status: ongoing 01/04/24 Pt will improve Patient specific functional scale (PSFS) to at least7 /10 to show improved function level Baseline: Goal status: ongoing 01/04/24 Pt will reduce pain to overall less than 3/10 with usual activity and walking without  assistance Baseline: Goal status: ongoing 01/04/24 Pt will improve 5 times sit to stand test to less than 13 seconds to show improved leg strength and balance Baseline: Goal status: ongoing 01/04/24  PLAN: PT FREQUENCY: 2-3 times per week   PT DURATION: 6-8 weeks  PLANNED INTERVENTIONS  97110-Therapeutic exercises, 97530- Therapeutic activity, 97112- Neuromuscular re-education, 97535- Self Care, 02859- Manual therapy, 97116- Gait training, and 97016- Vasopneumatic device  PLAN FOR NEXT SESSION: strength and gait progressions as tolerated.  NEXT MD VISIT:   Redell JONELLE Moose, PT,DPT 01/14/2024, 2:57  PM

## 2024-01-15 ENCOUNTER — Ambulatory Visit: Admitting: Surgical

## 2024-01-15 ENCOUNTER — Encounter: Payer: Self-pay | Admitting: Surgical

## 2024-01-15 DIAGNOSIS — Z96651 Presence of right artificial knee joint: Secondary | ICD-10-CM

## 2024-01-15 NOTE — Progress Notes (Signed)
 Post-Op Visit Note   Patient: [name] [name]           Date of Birth: [birth year]           MRN: [id number] Visit Date: 01/15/2024 PCP: Gladis Mustard, FNP   Assessment & Plan:  Chief Complaint:  Chief Complaint  Patient presents with   Post-op Follow-up    12/03/23 right Total knee replacement improving working with therapy still has pain at night that wakes her up but otherwise doing well    Visit Diagnoses:  1. Status post total right knee replacement     Plan: Patient a 53 year old female who presents s/p right total knee replacement of the right knee on 12/03/2023.  Doing well during the daytime and progressing well with physical therapy.  Main complaint is nighttime pain that wakes her up from sleep at night.  No fevers or chills.  [name] walking without ambulation assistance.  Concerned about what to do when she goes back to work and is considering looking for a different line of work than working at [employer] which would involve standing for about 12 hours.  On exam, patient has incision is well-healed.  Small effusion.  No evidence of infection or dehiscence.  Intact ankle dorsiflexion and plantarflexion.  No calf tenderness.  Negative Homans' sign.  Patient is able to perform straight leg raise without extensor lag and excellent quad strength rated 5/5.  She has range of motion from 0 degrees extension to about 115 degrees of knee flexion.  Palpable DP pulse.  Plan at this time is continue with physical therapy.  Counseled patient that nighttime pain should slowly improve over the next few months.  Will see her back in 6 weeks and she may call with any concerns in the meantime.  She will consider options as far as going back to work.  Follow-Up Instructions: No follow-ups on file.   Orders:  No orders of the defined types were placed in this encounter.  No orders of the defined types were placed in this encounter.   Imaging: No results found.  PMFS  History: Patient Active Problem List   Diagnosis Date Noted   Arthritis of right knee 12/08/2023   S/P total knee replacement, right 12/03/2023   Chronic rhinitis 02/01/2020   S/P right rotator cuff repair 04/16/2019   Gastroesophageal reflux disease without esophagitis 07/21/2018   Hidradenitis suppurativa 10/01/2016   OSA (obstructive sleep apnea) 05/13/2015   Morbid obesity (HCC) 05/13/2015   Hypothyroidism 03/27/2014   Panic attacks 03/27/2014   SVT (supraventricular tachycardia) 06/26/2012   Migraines 06/26/2012   Past Medical History:  Diagnosis Date   Allergy    SEASONAL   Anxiety    Chronic headaches    Colon polyps    GERD (gastroesophageal reflux disease)    History of kidney stones    Hyperlipidemia    Hypertension    Hypothyroidism    Palpitations    Sleep apnea     Family History  Problem Relation Age of Onset   Hypertension Mother    Lung cancer Father        died from   Heart disease Maternal Grandmother    Heart disease Maternal Grandfather    Breast cancer Paternal Grandmother        paternal aunts x2   Colon cancer Neg Hx    Esophageal cancer Neg Hx    Rectal cancer Neg Hx    Stomach cancer Neg Hx    Colon  polyps Neg Hx     Past Surgical History:  Procedure Laterality Date   CESAREAN SECTION     1194   COLONOSCOPY     KNEE ARTHROSCOPY     LITHOTRIPSY  12/2014   ROTATOR CUFF REPAIR Right 2021   TOTAL KNEE ARTHROPLASTY Right 12/03/2023   Procedure: ARTHROPLASTY, KNEE, TOTAL;  Surgeon: Addie Cordella Hamilton, MD;  Location: [facility] OR;  Service: Orthopedics;  Laterality: Right;   Social History   Occupational History    Employer: [employer]  Tobacco Use   Smoking status: Former    Current packs/day: 0.00    Average packs/day: 0.5 packs/day for 10.1 years (5.0 ttl pk-yrs)    Types: Cigarettes    Start date: 03/12/2008    Quit date: 04/05/2018    Years since quitting: 5.7   Smokeless tobacco: Never  Vaping Use   Vaping status: Never Used   Substance and Sexual Activity   Alcohol use: Yes    Alcohol/week: 0.0 standard drinks of alcohol    Comment: rare   Drug use: No   Sexual activity: Not Currently

## 2024-01-16 ENCOUNTER — Ambulatory Visit: Admitting: Physical Therapy

## 2024-01-21 ENCOUNTER — Encounter: Payer: Self-pay | Admitting: Physical Therapy

## 2024-01-21 ENCOUNTER — Ambulatory Visit: Admitting: Physical Therapy

## 2024-01-21 DIAGNOSIS — R2689 Other abnormalities of gait and mobility: Secondary | ICD-10-CM

## 2024-01-21 DIAGNOSIS — M79604 Pain in right leg: Secondary | ICD-10-CM

## 2024-01-21 DIAGNOSIS — M6281 Muscle weakness (generalized): Secondary | ICD-10-CM

## 2024-01-21 DIAGNOSIS — R600 Localized edema: Secondary | ICD-10-CM

## 2024-01-21 NOTE — Therapy (Signed)
 OUTPATIENT PHYSICAL THERAPY TREATMENT   Patient Name: [name] [name] MRN: [id number] DOB:1970/06/17, 53 y.o., female Today's Date: 01/21/2024  END OF SESSION:  PT End of Session - 01/21/24 1514     Visit Number 7    Number of Visits 15    Date for Recertification  02/19/24    PT Start Time 1510    PT Stop Time 1600    PT Time Calculation (min) 50 min    Activity Tolerance Patient tolerated treatment well    Behavior During Therapy [facility] for tasks assessed/performed           Past Medical History:  Diagnosis Date   Allergy    SEASONAL   Anxiety    Chronic headaches    Colon polyps    GERD (gastroesophageal reflux disease)    History of kidney stones    Hyperlipidemia    Hypertension    Hypothyroidism    Palpitations    Sleep apnea    Past Surgical History:  Procedure Laterality Date   CESAREAN SECTION     1194   COLONOSCOPY     KNEE ARTHROSCOPY     LITHOTRIPSY  12/2014   ROTATOR CUFF REPAIR Right 2021   TOTAL KNEE ARTHROPLASTY Right 12/03/2023   Procedure: ARTHROPLASTY, KNEE, TOTAL;  Surgeon: Addie Cordella Hamilton, MD;  Location: [facility] OR;  Service: Orthopedics;  Laterality: Right;   Patient Active Problem List   Diagnosis Date Noted   Arthritis of right knee 12/08/2023   S/P total knee replacement, right 12/03/2023   Chronic rhinitis 02/01/2020   S/P right rotator cuff repair 04/16/2019   Gastroesophageal reflux disease without esophagitis 07/21/2018   Hidradenitis suppurativa 10/01/2016   OSA (obstructive sleep apnea) 05/13/2015   Morbid obesity (HCC) 05/13/2015   Hypothyroidism 03/27/2014   Panic attacks 03/27/2014   SVT (supraventricular tachycardia) 06/26/2012   Migraines 06/26/2012    PCP: Gladis Mustard, FNP   REFERRING PROVIDER: Gladis, Mary-Margaret, *   REFERRING DIAG:  Diagnosis  [phone] (ICD-10-CM) - Status post total right knee replacement    Rationale for Evaluation and Treatment:  Rehabiliation  THERAPY DIAG:  Muscle weakness  (generalized)  Pain in right leg  Other abnormalities of gait and mobility  Localized edema  ONSET DATE: Rt TKA 12/03/23   SUBJECTIVE:                                                                                                                                                                                           SUBJECTIVE STATEMENT: She did some shopping in [place] over the weekend so some knee pain from walking a lot.  PERTINENT HISTORY:  See above PMH  PAIN:  NPRS scale: 4/10 upon arrival Pain location:Right knee in front Pain description: discomfort and soreness Aggravating factors: lifting leg up, bending, extending Relieving factors: ice, meds   PRECAUTIONS: ,  None  RED FLAGS: None   WEIGHT BEARING RESTRICTIONS:  No  FALLS:  Has patient fallen in last 6 months? No   OCCUPATION:  None, in between jobs  PLOF:  Independent  PATIENT GOALS:  Reduce   OBJECTIVE:  Note: Objective measures were completed at Evaluation unless otherwise noted.   PATIENT SURVEYS:  Patient-Specific Activity Scoring Scheme  0 represents "unable to perform." 10 represents "able to perform at prior level. 0 1 2 3 4 5 6 7 8 9  10 (Date and Score)   Activity Eval   01/14/24  1. Walking unassisted 3   8  2. Sitting in chair to eat 1 8   3.     4.    5.    Score 2/10 8/10   Total score = sum of the activity scores/number of activities Minimum detectable change (90%CI) for average score = 2 points Minimum detectable change (90%CI) for single activity score = 3 points     EDEMA:  Yes: moderate in right knee   GAIT: Assistive device utilized: Walker - 2 wheeled Level of assistance: Modified independence Comments: does not use AD PLOF    LOWER EXTREMITY ROM:     Active /PROM Right eval Right 01/03/24 Right 01/14/24  Hip flexion     Hip extension     Hip abduction     Hip adduction     Hip internal rotation     Hip external rotation     Knee  flexion 95/100 106/110 110/115  Knee extension 5/4 3/ 0  Ankle dorsiflexion     Ankle plantarflexion     Ankle inversion     Ankle eversion      (Blank rows = not tested)   LOWER EXTREMITY MMT:    MMT Right eval   Hip flexion 4   Hip extension    Hip abduction 4   Hip adduction    Hip internal rotation    Hip external rotation    Knee flexion 4   Knee extension 4   Ankle dorsiflexion    Ankle plantarflexion    Ankle inversion    Ankle eversion     (Blank rows = not tested)    FUNCTIONAL TESTS:  12/25/23 5 times sit to stand: 23 seconds without UE support                                                                                                                              TREATMENT DATE:  01/21/24 Bike seat#16, L2 X 10 min Hamstring stretch on stairs 30 sec X 3 Gastroc stretch on stairs 30 sec X 3 Stairs reciprocal pattern up/down 5 steps X 3 round trips with bilat UE  support Sit to stand from low mat surface 2 X 10 reps without UE support Seated hamstring curl machine DL 89# [id number] Seated leg extension machine DL 89# 2 X 15, then right leg only 10# X 10 Leg press machine DL 59# [id number] PROM knee flexion and extension X 5 min   -Vasopnuematic device X 10 min, medium compression, 34 deg to Rt knee   PATIENT EDUCATION: Education details: HEP, PT plan of care, selfcare Person educated: Patient Education method: Explanation, Demonstration, Verbal cues, and Handouts Education comprehension: verbalized understanding, further education recommended   HOME EXERCISE PROGRAM: Access Code: [email or web address] URL: [email or web address] Date: 12/25/2023 Prepared by: Redell Moose  Exercises - Supine Heel Slide with Strap  - 2-3 x daily - 6 x weekly - 1 sets - 10 reps - 5 hold - Quad Setting and Stretching  - 2-3 x daily - 6 x weekly - 1 sets - 10 reps - 5 sec hold - seated knee flexion stretch with knee extension strengthening  - 2-3 x daily - 6 x weekly - 1 sets  - 10 reps - 5 sec hold - Seated Straight Leg Raise with Quad Contraction  - 2-3 x daily - 6 x weekly - 2 sets - 10 reps - Sit to Stand Without Arm Support  - 2 x daily - 6 x weekly - 2 sets - 5 reps - Standing March with Counter Support  - 2 x daily - 6 x weekly - 1 sets - 10 reps  ASSESSMENT:  CLINICAL IMPRESSION: She had good tolerance to exercise progressions today. Overall she continues to do well with PT.  Per Eval: Patient referred to PT S/P Rt TKA 12/03/23.Doing well up to this point but patient will benefit from skilled PT to improve overall function and to address impairments and limitations listed below.  OBJECTIVE IMPAIRMENTS: decreased activity tolerance for ADL's, difficulty walking, decreased balance, decreased endurance, decreased mobility, decreased ROM, decreased strength, impaired flexibility, impaired LE use, and pain.  ACTIVITY LIMITATIONS: bending, liftting, walking, standing, cleaning, community activity, driving,  PERSONAL FACTORS: see above PMH  also affecting patient's functional outcome.  REHAB POTENTIAL: Good  CLINICAL DECISION MAKING: Stable/uncomplicated  EVALUATION COMPLEXITY: Low    GOALS: Short term PT Goals Target date: 01/22/2024    Pt will be I and compliant with HEP. Baseline:  Goal status: MET 01/06/24 Pt will decrease pain by 25% overall Baseline: Goal status: MET 01/06/24  Long term PT goals Target date:02/19/2024   Pt will improve right knee ROM to [facility] (3-115) to improve functional mobility Baseline: Goal status: ongoing 01/04/24 Pt will improve right knee strength to at least 5/5 MMT to improve functional strength Baseline: Goal status: ongoing 01/04/24 Pt will improve Patient specific functional scale (PSFS) to at least7 /10 to show improved function level Baseline: Goal status: ongoing 01/04/24 Pt will reduce pain to overall less than 3/10 with usual activity and walking without assistance Baseline: Goal status: ongoing  01/04/24 Pt will improve 5 times sit to stand test to less than 13 seconds to show improved leg strength and balance Baseline: Goal status: ongoing 01/04/24  PLAN: PT FREQUENCY: 2-3 times per week   PT DURATION: 6-8 weeks  PLANNED INTERVENTIONS  97110-Therapeutic exercises, 97530- Therapeutic activity, 97112- Neuromuscular re-education, 97535- Self Care, 02859- Manual therapy, 97116- Gait training, and 97016- Vasopneumatic device  PLAN FOR NEXT SESSION: strength and gait progressions as tolerated.  NEXT MD VISIT: 02/26/24  Redell JONELLE Moose, PT,DPT 01/21/2024, 3:30 PM

## 2024-01-23 ENCOUNTER — Encounter: Payer: Self-pay | Admitting: Physical Therapy

## 2024-01-23 ENCOUNTER — Ambulatory Visit: Admitting: Physical Therapy

## 2024-01-23 ENCOUNTER — Ambulatory Visit: Admitting: Nurse Practitioner

## 2024-01-23 VITALS — BP 140/83 | HR 71 | Temp 97.7°F | Ht 66.0 in | Wt 268.0 lb

## 2024-01-23 DIAGNOSIS — I471 Supraventricular tachycardia, unspecified: Secondary | ICD-10-CM

## 2024-01-23 DIAGNOSIS — M6281 Muscle weakness (generalized): Secondary | ICD-10-CM | POA: Diagnosis not present

## 2024-01-23 DIAGNOSIS — R946 Abnormal results of thyroid function studies: Secondary | ICD-10-CM | POA: Diagnosis not present

## 2024-01-23 DIAGNOSIS — E039 Hypothyroidism, unspecified: Secondary | ICD-10-CM | POA: Diagnosis not present

## 2024-01-23 DIAGNOSIS — F410 Panic disorder [episodic paroxysmal anxiety] without agoraphobia: Secondary | ICD-10-CM | POA: Diagnosis not present

## 2024-01-23 DIAGNOSIS — R3915 Urgency of urination: Secondary | ICD-10-CM

## 2024-01-23 DIAGNOSIS — G43901 Migraine, unspecified, not intractable, with status migrainosus: Secondary | ICD-10-CM | POA: Diagnosis not present

## 2024-01-23 DIAGNOSIS — R739 Hyperglycemia, unspecified: Secondary | ICD-10-CM | POA: Diagnosis not present

## 2024-01-23 DIAGNOSIS — K219 Gastro-esophageal reflux disease without esophagitis: Secondary | ICD-10-CM | POA: Diagnosis not present

## 2024-01-23 DIAGNOSIS — R2689 Other abnormalities of gait and mobility: Secondary | ICD-10-CM

## 2024-01-23 DIAGNOSIS — M79604 Pain in right leg: Secondary | ICD-10-CM

## 2024-01-23 DIAGNOSIS — R600 Localized edema: Secondary | ICD-10-CM

## 2024-01-23 LAB — CMP14+EGFR
ALT: 28 IU/L (ref 0–32)
AST: 32 IU/L (ref 0–40)
Albumin: 4.1 g/dL (ref 3.8–4.9)
Alkaline Phosphatase: 87 IU/L (ref 49–135)
BUN/Creatinine Ratio: 15 (ref 9–23)
BUN: 12 mg/dL (ref 6–24)
Bilirubin Total: 0.4 mg/dL (ref 0.0–1.2)
CO2: 23 mmol/L (ref 20–29)
Calcium: 9.5 mg/dL (ref 8.7–10.2)
Chloride: 103 mmol/L (ref 96–106)
Creatinine, Ser: 0.81 mg/dL (ref 0.57–1.00)
Globulin, Total: 2.8 g/dL (ref 1.5–4.5)
Glucose: 119 mg/dL — ABNORMAL HIGH (ref 70–99)
Potassium: 4.2 mmol/L (ref 3.5–5.2)
Sodium: 142 mmol/L (ref 134–144)
Total Protein: 6.9 g/dL (ref 6.0–8.5)
eGFR: 88 mL/min/1.73 (ref >59)

## 2024-01-23 LAB — CBC WITH DIFFERENTIAL/PLATELET
Basophils Absolute: 0.1 x10E3/uL (ref 0.0–0.2)
Basos: 1 %
EOS (ABSOLUTE): 0.2 x10E3/uL (ref 0.0–0.4)
Eos: 3 %
Hematocrit: 44.1 % (ref 34.0–46.6)
Hemoglobin: 14.1 g/dL (ref 11.1–15.9)
Immature Grans (Abs): 0 x10E3/uL (ref 0.0–0.1)
Immature Granulocytes: 0 %
Lymphocytes Absolute: 2.1 x10E3/uL (ref 0.7–3.1)
Lymphs: 26 %
MCH: 29.3 pg (ref 26.6–33.0)
MCHC: 32 g/dL (ref 31.5–35.7)
MCV: 92 fL (ref 79–97)
Monocytes Absolute: 0.7 x10E3/uL (ref 0.1–0.9)
Monocytes: 8 %
Neutrophils Absolute: 5.3 x10E3/uL (ref 1.4–7.0)
Neutrophils: 62 %
Platelets: 327 x10E3/uL (ref 150–450)
RBC: 4.82 x10E6/uL (ref 3.77–5.28)
RDW: 13.1 % (ref 11.7–15.4)
WBC: 8.4 x10E3/uL (ref 3.4–10.8)

## 2024-01-23 LAB — LIPID PANEL
Chol/HDL Ratio: 3.9 ratio (ref 0.0–4.4)
Cholesterol, Total: 149 mg/dL (ref 100–199)
HDL: 38 mg/dL — ABNORMAL LOW (ref >39)
LDL Chol Calc (NIH): 81 mg/dL (ref 0–99)
Triglycerides: 175 mg/dL — ABNORMAL HIGH (ref 0–149)
VLDL Cholesterol Cal: 30 mg/dL (ref 5–40)

## 2024-01-23 LAB — BAYER DCA HB A1C WAIVED: HB A1C (BAYER DCA - WAIVED): 5.7 % — ABNORMAL HIGH (ref 4.8–5.6)

## 2024-01-23 MED ORDER — PANTOPRAZOLE SODIUM 40 MG PO TBEC: 40.0000 mg | DELAYED_RELEASE_TABLET | Freq: Two times a day (BID) | ORAL | 1 refills | Status: AC

## 2024-01-23 MED ORDER — METOCLOPRAMIDE HCL 10 MG PO TABS: 10.0000 mg | ORAL_TABLET | Freq: Every day | ORAL | 1 refills | Status: AC

## 2024-01-23 MED ORDER — CLONAZEPAM 0.5 MG PO TABS: 0.5000 mg | ORAL_TABLET | Freq: Two times a day (BID) | ORAL | 5 refills | Status: AC | PRN

## 2024-01-23 MED ORDER — PROPRANOLOL HCL 80 MG PO TABS: 80.0000 mg | ORAL_TABLET | Freq: Every day | ORAL | 1 refills | Status: AC

## 2024-01-23 MED ORDER — OXYBUTYNIN CHLORIDE ER 5 MG PO TB24: 5.0000 mg | ORAL_TABLET | Freq: Every day | ORAL | 1 refills | Status: AC

## 2024-01-23 NOTE — Therapy (Signed)
 OUTPATIENT PHYSICAL THERAPY TREATMENT   Patient Name: [name] [name] MRN: [id number] DOB:[birth year], 53 y.o., female Today's Date: 01/23/2024  END OF SESSION:  PT End of Session - 01/23/24 1535     Visit Number 8    Number of Visits 15    Date for Recertification  02/19/24    PT Start Time 1510    PT Stop Time 1600    PT Time Calculation (min) 50 min    Activity Tolerance Patient tolerated treatment well    Behavior During Therapy [facility] for tasks assessed/performed           Past Medical History:  Diagnosis Date   Allergy    SEASONAL   Anxiety    Chronic headaches    Colon polyps    GERD (gastroesophageal reflux disease)    History of kidney stones    Hyperlipidemia    Hypertension    Hypothyroidism    Palpitations    Sleep apnea    Past Surgical History:  Procedure Laterality Date   CESAREAN SECTION     1194   COLONOSCOPY     KNEE ARTHROSCOPY     LITHOTRIPSY  12/2014   ROTATOR CUFF REPAIR Right 2021   TOTAL KNEE ARTHROPLASTY Right 12/03/2023   Procedure: ARTHROPLASTY, KNEE, TOTAL;  Surgeon: Addie Cordella Hamilton, MD;  Location: [facility] OR;  Service: Orthopedics;  Laterality: Right;   Patient Active Problem List   Diagnosis Date Noted   Arthritis of right knee 12/08/2023   S/P total knee replacement, right 12/03/2023   Chronic rhinitis 02/01/2020   S/P right rotator cuff repair 04/16/2019   Gastroesophageal reflux disease without esophagitis 07/21/2018   Hidradenitis suppurativa 10/01/2016   OSA (obstructive sleep apnea) 05/13/2015   Morbid obesity (HCC) 05/13/2015   Hypothyroidism 03/27/2014   Panic attacks 03/27/2014   SVT (supraventricular tachycardia) 06/26/2012   Migraines 06/26/2012    PCP: Gladis Mustard, FNP   REFERRING PROVIDER: Gladis, Mary-Margaret, *   REFERRING DIAG:  Diagnosis  [phone] (ICD-10-CM) - Status post total right knee replacement    Rationale for Evaluation and Treatment:  Rehabiliation  THERAPY DIAG:  Muscle weakness  (generalized)  Pain in right leg  Other abnormalities of gait and mobility  Localized edema  ONSET DATE: Rt TKA 12/03/23   SUBJECTIVE:                                                                                                                                                                                           SUBJECTIVE STATEMENT: Some soreness still but not bad.   PERTINENT HISTORY:  See above PMH  PAIN:  NPRS scale:  3/10 upon arrival Pain location:Right knee in front Pain description: discomfort and soreness Aggravating factors: lifting leg up, bending, extending Relieving factors: ice, meds   PRECAUTIONS: ,  None  RED FLAGS: None   WEIGHT BEARING RESTRICTIONS:  No  FALLS:  Has patient fallen in last 6 months? No   OCCUPATION:  None, in between jobs  PLOF:  Independent  PATIENT GOALS:  Reduce   OBJECTIVE:  Note: Objective measures were completed at Evaluation unless otherwise noted.   PATIENT SURVEYS:  Patient-Specific Activity Scoring Scheme  0 represents "unable to perform." 10 represents "able to perform at prior level. 0 1 2 3 4 5 6 7 8 9  10 (Date and Score)   Activity Eval   01/14/24  1. Walking unassisted 3   8  2. Sitting in chair to eat 1 8   3.     4.    5.    Score 2/10 8/10   Total score = sum of the activity scores/number of activities Minimum detectable change (90%CI) for average score = 2 points Minimum detectable change (90%CI) for single activity score = 3 points     EDEMA:  Yes: moderate in right knee   GAIT: Assistive device utilized: Walker - 2 wheeled Level of assistance: Modified independence Comments: does not use AD PLOF    LOWER EXTREMITY ROM:     Active /PROM Right eval Right 01/03/24 Right 01/14/24  Hip flexion     Hip extension     Hip abduction     Hip adduction     Hip internal rotation     Hip external rotation     Knee flexion 95/100 106/110 110/115  Knee extension 5/4 3/ 0   Ankle dorsiflexion     Ankle plantarflexion     Ankle inversion     Ankle eversion      (Blank rows = not tested)   LOWER EXTREMITY MMT:    MMT Right eval   Hip flexion 4   Hip extension    Hip abduction 4   Hip adduction    Hip internal rotation    Hip external rotation    Knee flexion 4   Knee extension 4   Ankle dorsiflexion    Ankle plantarflexion    Ankle inversion    Ankle eversion     (Blank rows = not tested)    FUNCTIONAL TESTS:  12/25/23 5 times sit to stand: 23 seconds without UE support                                                                                                                              TREATMENT DATE:  01/23/24 Bike seat#16, L2 X 15 min Hamstring stretch on stairs 30 sec X 3 Gastroc stretch on stairs 30 sec X 3 Stairs reciprocal pattern up/down 5 steps X 4 round trips with bilat UE support Seated SLR 2X15 Seated knee flexion stretch 5 sec X  15 Sit to stand from low mat surface X 10 reps without UE support and right leg shifted back some March walking without UE support 4 round trips in bars Seated hamstring curl machine DL 84# [id number] Seated leg extension machine DL 84# 2 X 10 Leg press machine DL 54# [id number]   -Vasopnuematic device X 10 min, medium compression, 34 deg to Rt knee   PATIENT EDUCATION: Education details: HEP, PT plan of care, selfcare Person educated: Patient Education method: Explanation, Demonstration, Verbal cues, and Handouts Education comprehension: verbalized understanding, further education recommended   HOME EXERCISE PROGRAM: Access Code: [email or web address] URL: [email or web address] Date: 12/25/2023 Prepared by: Redell Moose  Exercises - Supine Heel Slide with Strap  - 2-3 x daily - 6 x weekly - 1 sets - 10 reps - 5 hold - Quad Setting and Stretching  - 2-3 x daily - 6 x weekly - 1 sets - 10 reps - 5 sec hold - seated knee flexion stretch with knee extension strengthening  - 2-3 x daily - 6 x  weekly - 1 sets - 10 reps - 5 sec hold - Seated Straight Leg Raise with Quad Contraction  - 2-3 x daily - 6 x weekly - 2 sets - 10 reps - Sit to Stand Without Arm Support  - 2 x daily - 6 x weekly - 2 sets - 5 reps - Standing March with Counter Support  - 2 x daily - 6 x weekly - 1 sets - 10 reps  ASSESSMENT:  CLINICAL IMPRESSION: She does still have slight limp noted so worked on march walking exercise in efforts to improve weight acceptance, stance time, and more hip/knee ROM.   Per Eval: Patient referred to PT S/P Rt TKA 12/03/23.Doing well up to this point but patient will benefit from skilled PT to improve overall function and to address impairments and limitations listed below.  OBJECTIVE IMPAIRMENTS: decreased activity tolerance for ADL's, difficulty walking, decreased balance, decreased endurance, decreased mobility, decreased ROM, decreased strength, impaired flexibility, impaired LE use, and pain.  ACTIVITY LIMITATIONS: bending, liftting, walking, standing, cleaning, community activity, driving,  PERSONAL FACTORS: see above PMH  also affecting patient's functional outcome.  REHAB POTENTIAL: Good  CLINICAL DECISION MAKING: Stable/uncomplicated  EVALUATION COMPLEXITY: Low    GOALS: Short term PT Goals Target date: 01/22/2024    Pt will be I and compliant with HEP. Baseline:  Goal status: MET 01/06/24 Pt will decrease pain by 25% overall Baseline: Goal status: MET 01/06/24  Long term PT goals Target date:02/19/2024   Pt will improve right knee ROM to [facility] (3-115) to improve functional mobility Baseline: Goal status: ongoing 01/04/24 Pt will improve right knee strength to at least 5/5 MMT to improve functional strength Baseline: Goal status: ongoing 01/04/24 Pt will improve Patient specific functional scale (PSFS) to at least7 /10 to show improved function level Baseline: Goal status: ongoing 01/04/24 Pt will reduce pain to overall less than 3/10 with usual  activity and walking without assistance Baseline: Goal status: ongoing 01/04/24 Pt will improve 5 times sit to stand test to less than 13 seconds to show improved leg strength and balance Baseline: Goal status: ongoing 01/04/24  PLAN: PT FREQUENCY: 2-3 times per week   PT DURATION: 6-8 weeks  PLANNED INTERVENTIONS  97110-Therapeutic exercises, 97530- Therapeutic activity, 97112- Neuromuscular re-education, 97535- Self Care, 02859- Manual therapy, 97116- Gait training, and 97016- Vasopneumatic device  PLAN FOR NEXT SESSION: strength and gait progressions as tolerated.  NEXT MD VISIT: 02/26/24  Redell  JONELLE Moose, PT,DPT 01/23/2024, 3:37 PM

## 2024-01-23 NOTE — Patient Instructions (Signed)
 Fall Prevention in the Home, Adult Falls can cause injuries and can happen to people of all ages. There are many things you can do to make your home safer and to help prevent falls. What actions can I take to prevent falls? General information Use good lighting in all rooms. Make sure to: Replace any light bulbs that burn out. Turn on the lights in dark areas and use night-lights. Keep items that you use often in easy-to-reach places. Lower the shelves around your home if needed. Move furniture so that there are clear paths around it. Do not use throw rugs or other things on the floor that can make you trip. If any of your floors are uneven, fix them. Add color or contrast paint or tape to clearly mark and help you see: Grab bars or handrails. First and last steps of staircases. Where the edge of each step is. If you use a ladder or stepladder: Make sure that it is fully opened. Do not climb a closed ladder. Make sure the sides of the ladder are locked in place. Have someone hold the ladder while you use it. Know where your pets are as you move through your home. What can I do in the bathroom?     Keep the floor dry. Clean up any water on the floor right away. Remove soap buildup in the bathtub or shower. Buildup makes bathtubs and showers slippery. Use non-skid mats or decals on the floor of the bathtub or shower. Attach bath mats securely with double-sided, non-slip rug tape. If you need to sit down in the shower, use a non-slip stool. Install grab bars by the toilet and in the bathtub and shower. Do not use towel bars as grab bars. What can I do in the bedroom? Make sure that you have a light by your bed that is easy to reach. Do not use any sheets or blankets on your bed that hang to the floor. Have a firm chair or bench with side arms that you can use for support when you get dressed. What can I do in the kitchen? Clean up any spills right away. If you need to reach something  above you, use a step stool with a grab bar. Keep electrical cords out of the way. Do not use floor polish or wax that makes floors slippery. What can I do with my stairs? Do not leave anything on the stairs. Make sure that you have a light switch at the top and the bottom of the stairs. Make sure that there are handrails on both sides of the stairs. Fix handrails that are broken or loose. Install non-slip stair treads on all your stairs if they do not have carpet. Avoid having throw rugs at the top or bottom of the stairs. Choose a carpet that does not hide the edge of the steps on the stairs. Make sure that the carpet is firmly attached to the stairs. Fix carpet that is loose or worn. What can I do on the outside of my home? Use bright outdoor lighting. Fix the edges of walkways and driveways and fix any cracks. Clear paths of anything that can make you trip, such as tools or rocks. Add color or contrast paint or tape to clearly mark and help you see anything that might make you trip as you walk through a door, such as a raised step or threshold. Trim any bushes or trees on paths to your home. Check to see if handrails are loose  or broken and that both sides of all steps have handrails. Install guardrails along the edges of any raised decks and porches. Have leaves, snow, or ice cleared regularly. Use sand, salt, or ice melter on paths if you live where there is ice and snow during the winter. Clean up any spills in your garage right away. This includes grease or oil spills. What other actions can I take? Review your medicines with your doctor. Some medicines can cause dizziness or changes in blood pressure, which increase your risk of falling. Wear shoes that: Have a low heel. Do not wear high heels. Have rubber bottoms and are closed at the toe. Feel good on your feet and fit well. Use tools that help you move around if needed. These include: Canes. Walkers. Scooters. Crutches. Ask  your doctor what else you can do to help prevent falls. This may include seeing a physical therapist to learn to do exercises to move better and get stronger. Where to find more information Centers for Disease Control and Prevention, STEADI: TonerPromos.no [employer] on Aging: BaseRingTones.pl National Institute on Aging: BaseRingTones.pl Contact a doctor if: You are afraid of falling at home. You feel weak, drowsy, or dizzy at home. You fall at home. Get help right away if you: Lose consciousness or have trouble moving after a fall. Have a fall that causes a head injury. These symptoms may be an emergency. Get help right away. Call 911. Do not wait to see if the symptoms will go away. Do not drive yourself to the hospital. This information is not intended to replace advice given to you by your health care provider. Make sure you discuss any questions you have with your health care provider. Document Revised: 10/30/2021 Document Reviewed: 10/30/2021 Elsevier Patient Education  2024 [employer].

## 2024-01-23 NOTE — Progress Notes (Signed)
 Subjective:    Patient ID: [name] [name], female    DOB: November 08, 1970, 53 y.o.   MRN: [id number]   Chief Complaint: medical management of chronic issues      HPI:  [name] [name] is a 53 y.o. who identifies as a female who was assigned female at birth.   Social history: Lives with: mom and son Work history: out right now fro total knee replacement   Comes in today for follow up of the following chronic medical issues:    1 SVT (supraventricular tachycardia) Denies heart racing or palpitations  2. Migraine with status migrainosus, not intractable, unspecified migraine type Has had recent headaches. Was seen on 04/27/22 with frequent headache. She was given naprosyn  to take as needed. They have improved. She went to chiropractor nad had neck adjusted and that seemed to help.  3. Gastroesophageal reflux disease without esophagitis Is on reglan  and protonix  daily. Has bad symptoms if does not take.  4 Acquired hypothyroidism No problems that she is aware of. Decreased levothyroxine  to 75mcg daily at last visit. Lab Results  Component Value Date   TSH 6.750 (H) 06/11/2023     5. Panic attacks Takes klonopin  as needed    01/23/2024   10:43 AM 06/11/2023    8:14 AM 12/10/2022   11:36 AM 06/07/2022   12:09 PM  GAD 7 : Generalized Anxiety Score  Nervous, Anxious, on Edge 0 0 0 0  Control/stop worrying 0 0 0 0  Worry too much - different things 0 0 0 0  Trouble relaxing 0 0 0 0  Restless 0 0 0 0  Easily annoyed or irritable 0 0 0 0  Afraid - awful might happen 0 0 0 0  Total GAD 7 Score 0 0 0 0  Anxiety Difficulty Not difficult at all Not difficult at all Not difficult at all Not difficult at all       6. Morbid obesity (HCC) Weight is down 7lbs  Wt Readings from Last 3 Encounters:  01/23/24 268 lb (121.6 kg)  12/03/23 279 lb (126.6 kg)  11/28/23 279 lb (126.6 kg)   BMI Readings from Last 3 Encounters:  01/23/24 43.26 kg/m  12/03/23 45.03 kg/m  11/28/23 45.03  kg/m           New complaints: Patient had total knee replacement 12/03/23. Has been doing PT. Is doing well. Had follow up with ortho on 01/15/24.  Allergies  Allergen Reactions   Chlorpheniramine Palpitations    insomnia   Pseudoephedrine  Hcl Palpitations and Other (See Comments)    insomnia   Outpatient Encounter Medications as of 01/23/2024  Medication Sig   acetaminophen  (TYLENOL ) 325 MG tablet Take 1-2 tablets (325-650 mg total) by mouth every 6 (six) hours as needed for mild pain (pain score 1-3) or fever (or temp > 100.5).   azelastine  (ASTELIN ) 0.1 % nasal spray Place 2 sprays into both nostrils 2 (two) times daily. Use in each nostril as directed (Patient taking differently: Place 2 sprays into both nostrils 2 (two) times daily as needed (congestion.). Use in each nostril as directed)   clonazePAM  (KLONOPIN ) 0.5 MG tablet Take 1 tablet (0.5 mg total) by mouth 2 (two) times daily as needed. (Patient taking differently: Take 0.5 mg by mouth See admin instructions. Take 1 tablet (0.5 mg) by mouth in the day if needed for anxiety & take 1 tablet (0.5 mg) by mouth scheduled at night.)   cyclobenzaprine  (FLEXERIL ) 10 MG tablet TAKE 2 TABLETS BY  MOUTH AT BEDTIME (Patient taking differently: Take 10 mg by mouth at bedtime.)   docusate sodium  (COLACE) 100 MG capsule Take 1 capsule (100 mg total) by mouth 2 (two) times daily.   fexofenadine (ALLEGRA) 180 MG tablet Take 180 mg by mouth every evening.   FT ASPIRIN  81 MG chewable tablet CHEW ONE TABLET BY MOUTH TWICE DAILY   levothyroxine  (SYNTHROID ) 75 MCG tablet Take 1 tablet (75 mcg total) by mouth daily.   liothyronine  (CYTOMEL ) 5 MCG tablet TAKE 1 TABLET BY MOUTH EVERY DAY   Magnesium  Oxide 420 MG TABS Take 420 mg by mouth at bedtime.   metoCLOPramide  (REGLAN ) 10 MG tablet Take 1 tablet (10 mg total) by mouth daily. (Patient taking differently: Take 10 mg by mouth every evening.)   mometasone  (NASONEX ) 50 MCG/ACT nasal spray INSTILL  2 SPRAYS IN EACH NOSTRIL EVERY DAY (Patient taking differently: Place 2 sprays into the nose at bedtime.)   naproxen  (NAPROSYN ) 500 MG tablet TAKE 1 TABLET BY MOUTH TWICE DAILY WITH A MEAL (Patient taking differently: Take 500 mg by mouth 2 (two) times daily as needed (pain.).)   oxybutynin  (DITROPAN -XL) 5 MG 24 hr tablet Take 1 tablet (5 mg total) by mouth at bedtime. (Patient taking differently: Take 5 mg by mouth in the morning.)   oxycodone  (OXY-IR) 5 MG capsule Take 1 capsule (5 mg total) by mouth every 6 (six) hours as needed.   pantoprazole  (PROTONIX ) 40 MG tablet Take 1 tablet (40 mg total) by mouth 2 (two) times daily.   progesterone (PROMETRIUM) 100 MG capsule TAKE TWO CAPSULES BY MOUTH AT BEDTIME   propranolol  (INDERAL ) 80 MG tablet Take 1 tablet (80 mg total) by mouth daily.   No facility-administered encounter medications on file as of 01/23/2024.    Past Surgical History:  Procedure Laterality Date   CESAREAN SECTION     1194   COLONOSCOPY     KNEE ARTHROSCOPY     LITHOTRIPSY  12/2014   ROTATOR CUFF REPAIR Right 2021   TOTAL KNEE ARTHROPLASTY Right 12/03/2023   Procedure: ARTHROPLASTY, KNEE, TOTAL;  Surgeon: Addie Cordella Hamilton, MD;  Location: [facility] OR;  Service: Orthopedics;  Laterality: Right;    Family History  Problem Relation Age of Onset   Hypertension Mother    Lung cancer Father        died from   Heart disease Maternal Grandmother    Heart disease Maternal Grandfather    Breast cancer Paternal Grandmother        paternal aunts x2   Colon cancer Neg Hx    Esophageal cancer Neg Hx    Rectal cancer Neg Hx    Stomach cancer Neg Hx    Colon polyps Neg Hx       Controlled substance contract: n/a     Review of Systems  Constitutional:  Negative for diaphoresis.  Eyes:  Negative for pain.  Respiratory:  Negative for shortness of breath.   Cardiovascular:  Negative for chest pain, palpitations and leg swelling.  Gastrointestinal:  Negative for abdominal  pain.  Endocrine: Negative for polydipsia.  Skin:  Negative for rash.  Neurological:  Negative for dizziness, weakness and headaches.  Hematological:  Does not bruise/bleed easily.  All other systems reviewed and are negative.      Objective:   Physical Exam Vitals and nursing note reviewed.  Constitutional:      General: [name] not in acute distress.    Appearance: Normal appearance. [name] well-developed.  HENT:  Head: Normocephalic.     Right Ear: Tympanic membrane normal.     Left Ear: Tympanic membrane normal.     Nose: Nose normal.     Mouth/Throat:     Mouth: Mucous membranes are moist.  Eyes:     Pupils: Pupils are equal, round, and reactive to light.  Neck:     Vascular: No carotid bruit or JVD.  Cardiovascular:     Rate and Rhythm: Normal rate and regular rhythm.     Heart sounds: Normal heart sounds.  Pulmonary:     Effort: Pulmonary effort is normal. No respiratory distress.     Breath sounds: Normal breath sounds. No wheezing or rales.  Chest:     Chest wall: No tenderness.  Abdominal:     General: Bowel sounds are normal. There is no distension or abdominal bruit.     Palpations: Abdomen is soft. There is no hepatomegaly, splenomegaly, mass or pulsatile mass.     Tenderness: There is no abdominal tenderness.  Musculoskeletal:        General: Normal range of motion.     Cervical back: Normal range of motion and neck supple.  Lymphadenopathy:     Cervical: No cervical adenopathy.  Skin:    General: Skin is warm and dry.  Neurological:     Mental Status: [name] alert and oriented to person, place, and time.     Deep Tendon Reflexes: Reflexes are normal and symmetric.  Psychiatric:        Behavior: Behavior normal.        Thought Content: Thought content normal.        Judgment: Judgment normal.    LMP 01/28/2011   BP (!) 140/83   Pulse 71   Temp 97.7 F (36.5 C) (Temporal)   Ht 5' 6 (1.676 m)   Wt 268 lb (121.6 kg)   LMP 01/28/2011   SpO2  93%   BMI 43.26 kg/m   HGBA1c 5.7%      Assessment & Plan:  Candyce Gambino comes in today with chief complaint of medical management of chronic issues    Diagnosis and orders addressed:  1. SVT (supraventricular tachycardia) (HCC) (Primary) Avoid caffeine - CBC with Differential/Platelet - CMP14+EGFR - Lipid panel - levothyroxine  (SYNTHROID ) 88 MCG tablet; Take 1 tablet (88 mcg total) by mouth daily.  Dispense: 90 tablet; Refill: 1  2. Migraine with status migrainosus, not intractable, unspecified migraine type Avoid caffeine - VITAMIN D 25 Hydroxy (Vit-D Deficiency, Fractures) - propranolol  (INDERAL ) 80 MG tablet; Take 1 tablet (80 mg total) by mouth daily.  Dispense: 90 tablet; Refill: 1  3. Gastroesophageal reflux disease without esophagitis Avoid spicy foods Do not eat 2 hours prior to bedtime - metoCLOPramide  (REGLAN ) 10 MG tablet; Take 1 tablet (10 mg total) by mouth daily.  Dispense: 90 tablet; Refill: 1 - pantoprazole  (PROTONIX ) 40 MG tablet; Take 1 tablet (40 mg total) by mouth 2 (two) times daily.  Dispense: 180 tablet; Refill: 1  4. Acquired hypothyroidism Labs pending - Thyroid  Panel With TSH - levothyroxine  (SYNTHROID ) 88 MCG tablet; Take 1 tablet (88 mcg total) by mouth daily.  Dispense: 90 tablet; Refill: 1  5. Panic attacks Stress management - ToxASSURE Select 13 (MW), Urine - clonazePAM  (KLONOPIN ) 0.5 MG tablet; Take 1 tablet (0.5 mg total) by mouth 2 (two) times daily as needed.  Dispense: 60 tablet; Refill: 5  6. Morbid obesity (HCC) Discussed diet and exercise for person with BMI >25 Will recheck weight  in 3-6 months   7. Urinary urgency - oxybutynin  (DITROPAN -XL) 5 MG 24 hr tablet; Take 1 tablet (5 mg total) by mouth at bedtime.  Dispense: 90 tablet; Refill: 1   Labs pending Health Maintenance reviewed Diet and exercise encouraged  Follow up plan: 6 months   Mary-Margaret Gladis, FNP

## 2024-01-26 LAB — THYROID PANEL WITH TSH
Free Thyroxine Index: 2 (ref 1.2–4.9)
T3 Uptake Ratio: 26 % (ref 24–39)
T4, Total: 7.5 ug/dL (ref 4.5–12.0)
TSH: 4.47 u[IU]/mL (ref 0.450–4.500)

## 2024-01-26 LAB — SPECIMEN STATUS REPORT

## 2024-01-27 ENCOUNTER — Ambulatory Visit: Payer: Self-pay | Admitting: Nurse Practitioner

## 2024-01-27 ENCOUNTER — Ambulatory Visit: Admitting: Physical Therapy

## 2024-01-27 ENCOUNTER — Encounter: Payer: Self-pay | Admitting: Physical Therapy

## 2024-01-27 DIAGNOSIS — M6281 Muscle weakness (generalized): Secondary | ICD-10-CM

## 2024-01-27 DIAGNOSIS — R600 Localized edema: Secondary | ICD-10-CM

## 2024-01-27 DIAGNOSIS — M79604 Pain in right leg: Secondary | ICD-10-CM

## 2024-01-27 DIAGNOSIS — R2689 Other abnormalities of gait and mobility: Secondary | ICD-10-CM

## 2024-01-27 NOTE — Therapy (Signed)
 OUTPATIENT PHYSICAL THERAPY TREATMENT   Patient Name: [name] [name] MRN: [id number] DOB:[birth year], 53 y.o., female Today's Date: 01/27/2024  END OF SESSION:  PT End of Session - 01/27/24 1458     Visit Number 9    Number of Visits 15    Date for Recertification  02/19/24    PT Start Time 1430    PT Stop Time 1518    PT Time Calculation (min) 48 min    Activity Tolerance Patient tolerated treatment well    Behavior During Therapy [facility] for tasks assessed/performed           Past Medical History:  Diagnosis Date   Allergy    SEASONAL   Anxiety    Chronic headaches    Colon polyps    GERD (gastroesophageal reflux disease)    History of kidney stones    Hyperlipidemia    Hypertension    Hypothyroidism    Palpitations    Sleep apnea    Past Surgical History:  Procedure Laterality Date   CESAREAN SECTION     1194   COLONOSCOPY     KNEE ARTHROSCOPY     LITHOTRIPSY  12/2014   ROTATOR CUFF REPAIR Right 2021   TOTAL KNEE ARTHROPLASTY Right 12/03/2023   Procedure: ARTHROPLASTY, KNEE, TOTAL;  Surgeon: Addie Cordella Hamilton, MD;  Location: [facility] OR;  Service: Orthopedics;  Laterality: Right;   Patient Active Problem List   Diagnosis Date Noted   Arthritis of right knee 12/08/2023   S/P total knee replacement, right 12/03/2023   Chronic rhinitis 02/01/2020   S/P right rotator cuff repair 04/16/2019   Gastroesophageal reflux disease without esophagitis 07/21/2018   Hidradenitis suppurativa 10/01/2016   OSA (obstructive sleep apnea) 05/13/2015   Morbid obesity (HCC) 05/13/2015   Hypothyroidism 03/27/2014   Panic attacks 03/27/2014   SVT (supraventricular tachycardia) 06/26/2012   Migraines 06/26/2012    PCP: Gladis Mustard, FNP   REFERRING PROVIDER: Gladis, Mary-Margaret, *   REFERRING DIAG:  Diagnosis  [phone] (ICD-10-CM) - Status post total right knee replacement    Rationale for Evaluation and Treatment:  Rehabiliation  THERAPY DIAG:  Muscle weakness  (generalized)  Pain in right leg  Other abnormalities of gait and mobility  Localized edema  ONSET DATE: Rt TKA 12/03/23   SUBJECTIVE:                                                                                                                                                                                           SUBJECTIVE STATEMENT: Some soreness from walking around [employer]  PERTINENT HISTORY:  See above PMH  PAIN:  NPRS scale: 3/10  upon arrival Pain location:Right knee in front Pain description: discomfort and soreness Aggravating factors: lifting leg up, bending, extending Relieving factors: ice, meds   PRECAUTIONS: ,  None  RED FLAGS: None   WEIGHT BEARING RESTRICTIONS:  No  FALLS:  Has patient fallen in last 6 months? No   OCCUPATION:  None, in between jobs  PLOF:  Independent  PATIENT GOALS:  Reduce   OBJECTIVE:  Note: Objective measures were completed at Evaluation unless otherwise noted.   PATIENT SURVEYS:  Patient-Specific Activity Scoring Scheme  0 represents "unable to perform." 10 represents "able to perform at prior level. 0 1 2 3 4 5 6 7 8 9  10 (Date and Score)   Activity Eval   01/14/24  1. Walking unassisted 3   8  2. Sitting in chair to eat 1 8   3.     4.    5.    Score 2/10 8/10   Total score = sum of the activity scores/number of activities Minimum detectable change (90%CI) for average score = 2 points Minimum detectable change (90%CI) for single activity score = 3 points     EDEMA:  Yes: moderate in right knee   GAIT: Assistive device utilized: Walker - 2 wheeled Level of assistance: Modified independence Comments: does not use AD PLOF    LOWER EXTREMITY ROM:     Active /PROM Right eval Right 01/03/24 Right 01/14/24  Hip flexion     Hip extension     Hip abduction     Hip adduction     Hip internal rotation     Hip external rotation     Knee flexion 95/100 106/110 110/115  Knee extension  5/4 3/ 0  Ankle dorsiflexion     Ankle plantarflexion     Ankle inversion     Ankle eversion      (Blank rows = not tested)   LOWER EXTREMITY MMT:    MMT Right eval   Hip flexion 4   Hip extension    Hip abduction 4   Hip adduction    Hip internal rotation    Hip external rotation    Knee flexion 4   Knee extension 4   Ankle dorsiflexion    Ankle plantarflexion    Ankle inversion    Ankle eversion     (Blank rows = not tested)    FUNCTIONAL TESTS:  12/25/23 5 times sit to stand: 23 seconds without UE support                                                                                                                              TREATMENT DATE:  01/23/24 Bike seat#16, L2 X 15 min Hamstring stretch on stairs 30 sec X 3 Gastroc stretch on stairs 30 sec X 3 Stairs reciprocal pattern up/down 5 steps X 4 round trips with bilat UE support Seated SLR 2X15 Seated knee flexion stretch 5 sec X 15  Sit to stand from low mat surface X 10 reps without UE support and right leg shifted back some March walking without UE support 4 round trips in bars Seated hamstring curl machine DL 84# [id number] Seated leg extension machine DL 84# 2 X 10 Leg press machine DL 54# [id number]   -Vasopnuematic device X 10 min, medium compression, 34 deg to Rt knee   PATIENT EDUCATION: Education details: HEP, PT plan of care, selfcare Person educated: Patient Education method: Explanation, Demonstration, Verbal cues, and Handouts Education comprehension: verbalized understanding, further education recommended   HOME EXERCISE PROGRAM: Access Code: [email or web address] URL: [email or web address] Date: 12/25/2023 Prepared by: Redell Moose  Exercises - Supine Heel Slide with Strap  - 2-3 x daily - 6 x weekly - 1 sets - 10 reps - 5 hold - Quad Setting and Stretching  - 2-3 x daily - 6 x weekly - 1 sets - 10 reps - 5 sec hold - seated knee flexion stretch with knee extension strengthening  - 2-3 x  daily - 6 x weekly - 1 sets - 10 reps - 5 sec hold - Seated Straight Leg Raise with Quad Contraction  - 2-3 x daily - 6 x weekly - 2 sets - 10 reps - Sit to Stand Without Arm Support  - 2 x daily - 6 x weekly - 2 sets - 5 reps - Standing March with Counter Support  - 2 x daily - 6 x weekly - 1 sets - 10 reps  ASSESSMENT:  CLINICAL IMPRESSION: She was able to progress strength program today with good tolerance, some soreness but not too much and we will monitor this as we progress her.   Per Eval: Patient referred to PT S/P Rt TKA 12/03/23.Doing well up to this point but patient will benefit from skilled PT to improve overall function and to address impairments and limitations listed below.  OBJECTIVE IMPAIRMENTS: decreased activity tolerance for ADL's, difficulty walking, decreased balance, decreased endurance, decreased mobility, decreased ROM, decreased strength, impaired flexibility, impaired LE use, and pain.  ACTIVITY LIMITATIONS: bending, liftting, walking, standing, cleaning, community activity, driving,  PERSONAL FACTORS: see above PMH  also affecting patient's functional outcome.  REHAB POTENTIAL: Good  CLINICAL DECISION MAKING: Stable/uncomplicated  EVALUATION COMPLEXITY: Low    GOALS: Short term PT Goals Target date: 01/22/2024    Pt will be I and compliant with HEP. Baseline:  Goal status: MET 01/06/24 Pt will decrease pain by 25% overall Baseline: Goal status: MET 01/06/24  Long term PT goals Target date:02/19/2024   Pt will improve right knee ROM to [facility] (3-115) to improve functional mobility Baseline: Goal status: ongoing 01/04/24 Pt will improve right knee strength to at least 5/5 MMT to improve functional strength Baseline: Goal status: ongoing 01/04/24 Pt will improve Patient specific functional scale (PSFS) to at least7 /10 to show improved function level Baseline: Goal status: ongoing 01/04/24 Pt will reduce pain to overall less than 3/10 with usual  activity and walking without assistance Baseline: Goal status: ongoing 01/04/24 Pt will improve 5 times sit to stand test to less than 13 seconds to show improved leg strength and balance Baseline: Goal status: ongoing 01/04/24  PLAN: PT FREQUENCY: 2-3 times per week   PT DURATION: 6-8 weeks  PLANNED INTERVENTIONS  97110-Therapeutic exercises, 97530- Therapeutic activity, 97112- Neuromuscular re-education, 97535- Self Care, 02859- Manual therapy, 97116- Gait training, and 97016- Vasopneumatic device  PLAN FOR NEXT SESSION: strength and gait progressions as tolerated.  NEXT MD VISIT: 02/26/24  Redell  JONELLE Moose, PT,DPT 01/27/2024, 3:05 PM

## 2024-01-31 ENCOUNTER — Ambulatory Visit: Admitting: Physical Therapy

## 2024-02-04 ENCOUNTER — Ambulatory Visit: Admitting: Physical Therapy

## 2024-02-11 ENCOUNTER — Ambulatory Visit: Admitting: Physical Therapy

## 2024-02-13 ENCOUNTER — Encounter: Payer: Self-pay | Admitting: Physical Therapy

## 2024-02-13 ENCOUNTER — Ambulatory Visit: Attending: Orthopedic Surgery | Admitting: Physical Therapy

## 2024-02-13 DIAGNOSIS — M6281 Muscle weakness (generalized): Secondary | ICD-10-CM | POA: Diagnosis present

## 2024-02-13 DIAGNOSIS — M79604 Pain in right leg: Secondary | ICD-10-CM | POA: Insufficient documentation

## 2024-02-13 DIAGNOSIS — R600 Localized edema: Secondary | ICD-10-CM | POA: Insufficient documentation

## 2024-02-13 DIAGNOSIS — R2689 Other abnormalities of gait and mobility: Secondary | ICD-10-CM | POA: Diagnosis present

## 2024-02-13 NOTE — Therapy (Signed)
 OUTPATIENT PHYSICAL THERAPY TREATMENT/Discharge PHYSICAL THERAPY DISCHARGE SUMMARY  Visits from Start of Care: 10  Current functional level related to goals / functional outcomes: See below   Remaining deficits: See below   Education / Equipment: HEP  Plan:  Patient goals were  met and she feels ready to discharge today and PT agrees.      Patient Name: [name] [name] MRN: [id number] DOB:1971/03/11, 53 y.o., female Today's Date: 02/13/2024  END OF SESSION:  PT End of Session - 02/13/24 1516     Visit Number 10    Number of Visits 15    Date for Recertification  02/19/24    PT Start Time 1500    PT Stop Time 1545    PT Time Calculation (min) 45 min    Activity Tolerance Patient tolerated treatment well    Behavior During Therapy [facility] for tasks assessed/performed           Past Medical History:  Diagnosis Date   Allergy    SEASONAL   Anxiety    Chronic headaches    Colon polyps    GERD (gastroesophageal reflux disease)    History of kidney stones    Hyperlipidemia    Hypertension    Hypothyroidism    Palpitations    Sleep apnea    Past Surgical History:  Procedure Laterality Date   CESAREAN SECTION     1194   COLONOSCOPY     KNEE ARTHROSCOPY     LITHOTRIPSY  12/2014   ROTATOR CUFF REPAIR Right 2021   TOTAL KNEE ARTHROPLASTY Right 12/03/2023   Procedure: ARTHROPLASTY, KNEE, TOTAL;  Surgeon: Addie Cordella Hamilton, MD;  Location: [facility] OR;  Service: Orthopedics;  Laterality: Right;   Patient Active Problem List   Diagnosis Date Noted   Arthritis of right knee 12/08/2023   S/P total knee replacement, right 12/03/2023   Chronic rhinitis 02/01/2020   S/P right rotator cuff repair 04/16/2019   Gastroesophageal reflux disease without esophagitis 07/21/2018   Hidradenitis suppurativa 10/01/2016   OSA (obstructive sleep apnea) 05/13/2015   Morbid obesity (HCC) 05/13/2015   Hypothyroidism 03/27/2014   Panic attacks 03/27/2014   SVT (supraventricular  tachycardia) 06/26/2012   Migraines 06/26/2012    PCP: Gladis Mustard, FNP   REFERRING PROVIDER: Gladis, Mary-Margaret, *   REFERRING DIAG:  Diagnosis  [phone] (ICD-10-CM) - Status post total right knee replacement    Rationale for Evaluation and Treatment:  Rehabiliation  THERAPY DIAG:  Muscle weakness (generalized)  Pain in right leg  Other abnormalities of gait and mobility  Localized edema  ONSET DATE: Rt TKA 12/03/23   SUBJECTIVE:  SUBJECTIVE STATEMENT: Her knee is doing good and she feels ready to graduate from PT today.  PERTINENT HISTORY:  See above PMH  PAIN:  NPRS scale: 1/10 upon arrival Pain location:Right knee in front Pain description: discomfort and soreness Aggravating factors: lifting leg up, bending, extending Relieving factors: ice, meds   PRECAUTIONS: ,  None  RED FLAGS: None   WEIGHT BEARING RESTRICTIONS:  No  FALLS:  Has patient fallen in last 6 months? No   OCCUPATION:  None, in between jobs  PLOF:  Independent  PATIENT GOALS:  Reduce   OBJECTIVE:  Note: Objective measures were completed at Evaluation unless otherwise noted.   PATIENT SURVEYS:  Patient-Specific Activity Scoring Scheme  0 represents "unable to perform." 10 represents "able to perform at prior level. 0 1 2 3 4 5 6 7 8 9  10 (Date and Score)   Activity Eval   01/14/24  1. Walking unassisted 3   8  2. Sitting in chair to eat 1 8   3.     4.    5.    Score 2/10 8/10   Total score = sum of the activity scores/number of activities Minimum detectable change (90%CI) for average score = 2 points Minimum detectable change (90%CI) for single activity score = 3 points     EDEMA:  Yes: moderate in right knee   GAIT: Assistive device utilized: Walker - 2  wheeled Level of assistance: Modified independence Comments: does not use AD PLOF    LOWER EXTREMITY ROM:     Active /PROM Right eval Right 01/03/24 Right 01/14/24 Right 02/13/24  Hip flexion      Hip extension      Hip abduction      Hip adduction      Hip internal rotation      Hip external rotation      Knee flexion 95/100 106/110 110/115 115/122  Knee extension 5/4 3/ 0 0  Ankle dorsiflexion      Ankle plantarflexion      Ankle inversion      Ankle eversion       (Blank rows = not tested)   LOWER EXTREMITY MMT:    MMT Right eval Right 02/13/24  Hip flexion 4 5  Hip extension    Hip abduction 4 5  Hip adduction    Hip internal rotation    Hip external rotation    Knee flexion 4 5  Knee extension 4 5  Ankle dorsiflexion    Ankle plantarflexion    Ankle inversion    Ankle eversion     (Blank rows = not tested)    FUNCTIONAL TESTS:  12/25/23 5 times sit to stand: 23 seconds without UE support  02/13/24 5 times sit stand: 5.8 sec  TREATMENT DATE:  02/13/24 Bike seat#16, L3 X 15 min Hamstring stretch on stairs 30 sec X 3 Stairs reciprocal pattern up/down 5 steps X 3 round trips with bilat UE support Seated knee flexion stretch 5 sec X 15 Sit to stand from low mat surface X 10 reps without UE support and right leg shifted back some March walking without UE support 4 round trips in bars Seated hamstring curl machine DL 79# [id number] Seated leg extension machine DL 79# 2 X 10 Leg press machine DL 54# 7K89   -Vasopnuematic device X 10 min, medium compression, 34 deg to Rt knee   PATIENT EDUCATION: Education details: HEP, PT plan of care, selfcare Person educated: Patient Education method: Explanation, Demonstration, Verbal cues, and Handouts Education comprehension: verbalized understanding, further education recommended   HOME  EXERCISE PROGRAM: Access Code: [email or web address] URL: [email or web address] Date: 12/25/2023 Prepared by: Redell Moose  Exercises - Supine Heel Slide with Strap  - 2-3 x daily - 6 x weekly - 1 sets - 10 reps - 5 hold - Quad Setting and Stretching  - 2-3 x daily - 6 x weekly - 1 sets - 10 reps - 5 sec hold - seated knee flexion stretch with knee extension strengthening  - 2-3 x daily - 6 x weekly - 1 sets - 10 reps - 5 sec hold - Seated Straight Leg Raise with Quad Contraction  - 2-3 x daily - 6 x weekly - 2 sets - 10 reps - Sit to Stand Without Arm Support  - 2 x daily - 6 x weekly - 2 sets - 5 reps - Standing March with Counter Support  - 2 x daily - 6 x weekly - 1 sets - 10 reps  ASSESSMENT:  CLINICAL IMPRESSION: Patient therapy goals were met and she feels ready to discharge today and PT agrees.   OBJECTIVE IMPAIRMENTS: decreased activity tolerance for ADL's, difficulty walking, decreased balance, decreased endurance, decreased mobility, decreased ROM, decreased strength, impaired flexibility, impaired LE use, and pain.  ACTIVITY LIMITATIONS: bending, liftting, walking, standing, cleaning, community activity, driving,  PERSONAL FACTORS: see above PMH  also affecting patient's functional outcome.  REHAB POTENTIAL: Good  CLINICAL DECISION MAKING: Stable/uncomplicated  EVALUATION COMPLEXITY: Low    GOALS: Short term PT Goals Target date: 01/22/2024    Pt will be I and compliant with HEP. Baseline:  Goal status: MET 01/06/24 Pt will decrease pain by 25% overall Baseline: Goal status: MET 01/06/24  Long term PT goals Target date:02/19/2024   Pt will improve right knee ROM to [facility] (3-115) to improve functional mobility Baseline: Goal status: MET 02/13/24 Pt will improve right knee strength to at least 5/5 MMT to improve functional strength Baseline: Goal status: MET 02/13/24 Pt will improve Patient specific functional scale (PSFS) to at least7 /10 to show improved  function level Baseline: Goal status: MET Pt will reduce pain to overall less than 3/10 with usual activity and walking without assistance Baseline: Goal status: MET 02/13/24 Pt will improve 5 times sit to stand test to less than 13 seconds to show improved leg strength and balance Baseline: Goal status: MET 02/13/24  PLAN: PT FREQUENCY: 2-3 times per week   PT DURATION: 6-8 weeks  PLANNED INTERVENTIONS  97110-Therapeutic exercises, 97530- Therapeutic activity, 97112- Neuromuscular re-education, 97535- Self Care, 02859- Manual therapy, 97116- Gait training, and 97016- Vasopneumatic device  PLAN FOR NEXT SESSION: DC today. NEXT MD VISIT: 02/26/24  Redell JONELLE Moose, PT,DPT 02/13/2024, 3:17 PM

## 2024-02-19 ENCOUNTER — Other Ambulatory Visit: Payer: Self-pay | Admitting: Nurse Practitioner

## 2024-02-19 ENCOUNTER — Ambulatory Visit: Admitting: Physical Therapy

## 2024-02-25 ENCOUNTER — Ambulatory Visit: Admitting: Physical Therapy

## 2024-02-26 ENCOUNTER — Ambulatory Visit: Admitting: Surgical

## 2024-03-10 ENCOUNTER — Other Ambulatory Visit: Payer: Self-pay | Admitting: Nurse Practitioner

## 2024-03-10 DIAGNOSIS — M545 Low back pain, unspecified: Secondary | ICD-10-CM

## 2024-03-30 ENCOUNTER — Ambulatory Visit: Payer: Self-pay

## 2024-03-30 NOTE — Telephone Encounter (Signed)
 FYI Only or Action Required?: Action required by provider: clinical question for provider and update on patient condition.  Patient was last seen in primary care on 01/23/2024 by [name] Mustard, FNP.  Called Nurse Triage reporting Nasal Congestion.  Symptoms began several days ago.  Interventions attempted: OTC medications: ibuprofen ; saline rinse; mucinex  and Prescription medications: Astelin  nasal spray; allegra.  Symptoms are: gradually worsening.  Triage Disposition: See PCP When Office is Open (Within 3 Days)  Patient/caregiver understands and will follow disposition?: Yes    Copied from CRM #[id number]. Topic: Clinical - Medical Advice >> Mar 30, 2024  3:16 PM Victoria B wrote: Reason for CRM: patient called in to see if she can see anyone today at the practice, because she states its really hard to sleep being stuffy and uses a cpap machine. She has an appt tomorrow with her primary Reason for Disposition  [1] Using nasal washes and pain medicine > 24 hours AND [2] sinus pain (around cheekbone or eye) persists  Answer Assessment - Initial Assessment Questions Returned pt's call to f/u on symptoms. Pt states she has appt tomorrow 03/31/24 but she is getting more anxious at night wearing her CPAP with increased congestion. Pt states that she has tried nasal washes with saline, ibuprofen  and mucinex ; pt is unable to take decongestants d/t palpitations. Attempted to contact CAL d/t PCP being DOD but per Montie no appts remaining this afternoon. Relayed information to pt and reassured her I would send update to PCP for any additional recommendations. She voiced appreciation.     1. LOCATION: Where does it hurt?      Under eyes, behind ears, cheekbones; pt states she has so much pressure even my teeth hurt  2. ONSET: When did the sinus pain start?  (e.g., hours, days)      Started 01/16 but worsened 01/17  3. SEVERITY: How bad is the pain?   (Scale 0-10; or none,  mild, moderate or severe)     Moderate to severe; worse at night   4. RECURRENT SYMPTOM: Have you ever had sinus problems before? If Yes, ask: When was the last time? and What happened that time?      Yes; pt seen in the past and prescribed abx   5. NASAL CONGESTION: Is the nose blocked? If Yes, ask: Can you open it or must you breathe through your mouth?     Yes, breathing out of mouth. Pt states she is doing saline rinses but only provides relief for minutes   6. NASAL DISCHARGE: Do you have discharge from your nose? If so ask, What color?     Yes  7. FEVER: Do you have a fever? If Yes, ask: What is it, how was it measured, and when did it start?      No   8. OTHER SYMPTOMS: Do you have any other symptoms? (e.g., sore throat, cough, earache, difficulty breathing)     Congestion, sinus pressure; anxiety wearing CPAP  Protocols used: Sinus Pain or Congestion-A-AH

## 2024-03-30 NOTE — Telephone Encounter (Signed)
Done.    LS

## 2024-03-31 ENCOUNTER — Ambulatory Visit: Admitting: Nurse Practitioner

## 2024-03-31 ENCOUNTER — Encounter: Payer: Self-pay | Admitting: Nurse Practitioner

## 2024-03-31 NOTE — Progress Notes (Unsigned)
 "  Subjective:    Patient ID: [name] [name], female    DOB: 20-Jan-1971, 54 y.o.   MRN: [id number]   Chief Complaint: medical management of chronic issues      HPI:  [name] [name] is a 54 y.o. who identifies as a female who was assigned female at birth.   Social history: Lives with: mom and son Work history: out right now fro total knee replacement   Comes in today for follow up of the following chronic medical issues:    1 SVT (supraventricular tachycardia) Denies heart racing or palpitations  2. Migraine with status migrainosus, not intractable, unspecified migraine type Has had recent headaches. Was seen on 04/27/22 with frequent headache. She was given naprosyn  to take as needed. They have improved. She went to chiropractor nad had neck adjusted and that seemed to help.  3. Gastroesophageal reflux disease without esophagitis Is on reglan  and protonix  daily. Has bad symptoms if does not take.  4 Acquired hypothyroidism No problems that she is aware of. Decreased levothyroxine  to 75mcg daily at last visit. Lab Results  Component Value Date   TSH 4.470 01/23/2024     5. Panic attacks Takes klonopin  as needed    01/23/2024   10:43 AM 06/11/2023    8:14 AM 12/10/2022   11:36 AM 06/07/2022   12:09 PM  GAD 7 : Generalized Anxiety Score  Nervous, Anxious, on Edge 0  0  0  0   Control/stop worrying 0  0  0  0   Worry too much - different things 0  0  0  0   Trouble relaxing 0  0  0  0   Restless 0  0  0  0   Easily annoyed or irritable 0  0  0  0   Afraid - awful might happen 0  0  0  0   Total GAD 7 Score 0 0 0 0  Anxiety Difficulty Not difficult at all Not difficult at all Not difficult at all Not difficult at all     Data saved with a previous flowsheet row definition       6. Morbid obesity (HCC) Weight is down 7lbs  Wt Readings from Last 3 Encounters:  01/23/24 268 lb (121.6 kg)  12/03/23 279 lb (126.6 kg)  11/28/23 279 lb (126.6 kg)   BMI Readings from  Last 3 Encounters:  01/23/24 43.26 kg/m  12/03/23 45.03 kg/m  11/28/23 45.03 kg/m           New complaints: None today  Allergies  Allergen Reactions   Chlorpheniramine Palpitations    insomnia   Pseudoephedrine  Hcl Palpitations and Other (See Comments)    insomnia   Outpatient Encounter Medications as of 03/31/2024  Medication Sig   acetaminophen  (TYLENOL ) 325 MG tablet Take 1-2 tablets (325-650 mg total) by mouth every 6 (six) hours as needed for mild pain (pain score 1-3) or fever (or temp > 100.5).   azelastine  (ASTELIN ) 0.1 % nasal spray Place 2 sprays into both nostrils 2 (two) times daily. Use in each nostril as directed   clonazePAM  (KLONOPIN ) 0.5 MG tablet Take 1 tablet (0.5 mg total) by mouth 2 (two) times daily as needed.   cyclobenzaprine  (FLEXERIL ) 10 MG tablet TAKE 2 TABLETS BY MOUTH AT BEDTIME   fexofenadine (ALLEGRA) 180 MG tablet Take 180 mg by mouth every evening.   levothyroxine  (SYNTHROID ) 75 MCG tablet Take 1 tablet (75 mcg total) by mouth daily.   liothyronine  (  CYTOMEL ) 5 MCG tablet TAKE 1 TABLET BY MOUTH EVERY DAY   Magnesium  Oxide 420 MG TABS Take 420 mg by mouth at bedtime.   metoCLOPramide  (REGLAN ) 10 MG tablet Take 1 tablet (10 mg total) by mouth daily.   mometasone  (NASONEX ) 50 MCG/ACT nasal spray INSTILL 2 SPRAYS IN EACH NOSTRIL EVERY DAY   naproxen  (NAPROSYN ) 500 MG tablet TAKE 1 TABLET BY MOUTH TWICE DAILY WITH A MEAL   oxybutynin  (DITROPAN -XL) 5 MG 24 hr tablet Take 1 tablet (5 mg total) by mouth at bedtime.   oxycodone  (OXY-IR) 5 MG capsule Take 1 capsule (5 mg total) by mouth every 6 (six) hours as needed.   pantoprazole  (PROTONIX ) 40 MG tablet Take 1 tablet (40 mg total) by mouth 2 (two) times daily.   progesterone (PROMETRIUM) 100 MG capsule TAKE TWO CAPSULES BY MOUTH AT BEDTIME   propranolol  (INDERAL ) 80 MG tablet Take 1 tablet (80 mg total) by mouth daily.   No facility-administered encounter medications on file as of 03/31/2024.     Past Surgical History:  Procedure Laterality Date   CESAREAN SECTION     1194   COLONOSCOPY     KNEE ARTHROSCOPY     LITHOTRIPSY  12/2014   ROTATOR CUFF REPAIR Right 2021   TOTAL KNEE ARTHROPLASTY Right 12/03/2023   Procedure: ARTHROPLASTY, KNEE, TOTAL;  Surgeon: Addie Cordella Hamilton, MD;  Location: [facility] OR;  Service: Orthopedics;  Laterality: Right;    Family History  Problem Relation Age of Onset   Hypertension Mother    Lung cancer Father        died from   Heart disease Maternal Grandmother    Heart disease Maternal Grandfather    Breast cancer Paternal Grandmother        paternal aunts x2   Colon cancer Neg Hx    Esophageal cancer Neg Hx    Rectal cancer Neg Hx    Stomach cancer Neg Hx    Colon polyps Neg Hx       Controlled substance contract: n/a     Review of Systems  Constitutional:  Negative for diaphoresis.  Eyes:  Negative for pain.  Respiratory:  Negative for shortness of breath.   Cardiovascular:  Negative for chest pain, palpitations and leg swelling.  Gastrointestinal:  Negative for abdominal pain.  Endocrine: Negative for polydipsia.  Skin:  Negative for rash.  Neurological:  Negative for dizziness, weakness and headaches.  Hematological:  Does not bruise/bleed easily.  All other systems reviewed and are negative.      Objective:   Physical Exam Vitals and nursing note reviewed.  Constitutional:      General: [name] not in acute distress.    Appearance: Normal appearance. [name] well-developed.  HENT:     Head: Normocephalic.     Right Ear: Tympanic membrane normal.     Left Ear: Tympanic membrane normal.     Nose: Nose normal.     Mouth/Throat:     Mouth: Mucous membranes are moist.  Eyes:     Pupils: Pupils are equal, round, and reactive to light.  Neck:     Vascular: No carotid bruit or JVD.  Cardiovascular:     Rate and Rhythm: Normal rate and regular rhythm.     Heart sounds: Normal heart sounds.  Pulmonary:     Effort:  Pulmonary effort is normal. No respiratory distress.     Breath sounds: Normal breath sounds. No wheezing or rales.  Chest:  Chest wall: No tenderness.  Abdominal:     General: Bowel sounds are normal. There is no distension or abdominal bruit.     Palpations: Abdomen is soft. There is no hepatomegaly, splenomegaly, mass or pulsatile mass.     Tenderness: There is no abdominal tenderness.  Musculoskeletal:        General: Normal range of motion.     Cervical back: Normal range of motion and neck supple.  Lymphadenopathy:     Cervical: No cervical adenopathy.  Skin:    General: Skin is warm and dry.  Neurological:     Mental Status: [name] alert and oriented to person, place, and time.     Deep Tendon Reflexes: Reflexes are normal and symmetric.  Psychiatric:        Behavior: Behavior normal.        Thought Content: Thought content normal.        Judgment: Judgment normal.    LMP 01/28/2011   LMP 01/28/2011   HGBA1c 5.7%      Assessment & Plan:  Ginette Bradway comes in today with chief complaint of medical management of chronic issues    Diagnosis and orders addressed:  1. SVT (supraventricular tachycardia) (HCC) (Primary) Avoid caffeine - CBC with Differential/Platelet - CMP14+EGFR - Lipid panel - levothyroxine  (SYNTHROID ) 88 MCG tablet; Take 1 tablet (88 mcg total) by mouth daily.  Dispense: 90 tablet; Refill: 1  2. Migraine with status migrainosus, not intractable, unspecified migraine type Avoid caffeine - VITAMIN D 25 Hydroxy (Vit-D Deficiency, Fractures) - propranolol  (INDERAL ) 80 MG tablet; Take 1 tablet (80 mg total) by mouth daily.  Dispense: 90 tablet; Refill: 1  3. Gastroesophageal reflux disease without esophagitis Avoid spicy foods Do not eat 2 hours prior to bedtime - metoCLOPramide  (REGLAN ) 10 MG tablet; Take 1 tablet (10 mg total) by mouth daily.  Dispense: 90 tablet; Refill: 1 - pantoprazole  (PROTONIX ) 40 MG tablet; Take 1 tablet (40 mg total) by  mouth 2 (two) times daily.  Dispense: 180 tablet; Refill: 1  4. Acquired hypothyroidism Labs pending - Thyroid  Panel With TSH - levothyroxine  (SYNTHROID ) 88 MCG tablet; Take 1 tablet (88 mcg total) by mouth daily.  Dispense: 90 tablet; Refill: 1  5. Panic attacks Stress management - ToxASSURE Select 13 (MW), Urine - clonazePAM  (KLONOPIN ) 0.5 MG tablet; Take 1 tablet (0.5 mg total) by mouth 2 (two) times daily as needed.  Dispense: 60 tablet; Refill: 5  6. Morbid obesity (HCC) Discussed diet and exercise for person with BMI >25 Will recheck weight in 3-6 months   7. Urinary urgency - oxybutynin  (DITROPAN -XL) 5 MG 24 hr tablet; Take 1 tablet (5 mg total) by mouth at bedtime.  Dispense: 90 tablet; Refill: 1   Labs pending Health Maintenance reviewed Diet and exercise encouraged  Follow up plan: 6 months   Mary-Margaret Gladis, FNP  "
